# Patient Record
Sex: Male | Born: 1950 | Race: White | Hispanic: No | Marital: Married | Smoking: Never smoker
Health system: Southern US, Community
[De-identification: ages and names within clinical notes are randomized; demographics above are authoritative.]

## PROBLEM LIST (undated history)

## (undated) DIAGNOSIS — K649 Unspecified hemorrhoids: Secondary | ICD-10-CM

## (undated) DIAGNOSIS — B2790 Infectious mononucleosis, unspecified without complication: Secondary | ICD-10-CM

## (undated) DIAGNOSIS — K5730 Diverticulosis of large intestine without perforation or abscess without bleeding: Secondary | ICD-10-CM

## (undated) DIAGNOSIS — I493 Ventricular premature depolarization: Secondary | ICD-10-CM

## (undated) HISTORY — PX: OTHER SURGICAL HISTORY: SHX169

## (undated) HISTORY — DX: Unspecified hemorrhoids: K64.9

## (undated) HISTORY — DX: Infectious mononucleosis, unspecified without complication: B27.90

## (undated) HISTORY — DX: Ventricular premature depolarization: I49.3

## (undated) HISTORY — PX: TONSILLECTOMY AND ADENOIDECTOMY: SUR1326

## (undated) HISTORY — DX: Diverticulosis of large intestine without perforation or abscess without bleeding: K57.30

---

## 1971-08-19 DIAGNOSIS — B2790 Infectious mononucleosis, unspecified without complication: Secondary | ICD-10-CM

## 1971-08-19 HISTORY — DX: Infectious mononucleosis, unspecified without complication: B27.90

## 1990-08-18 HISTORY — PX: KNEE ARTHROSCOPY: SUR90

## 2003-08-19 DIAGNOSIS — K5730 Diverticulosis of large intestine without perforation or abscess without bleeding: Secondary | ICD-10-CM

## 2003-08-19 HISTORY — DX: Diverticulosis of large intestine without perforation or abscess without bleeding: K57.30

## 2003-08-19 HISTORY — PX: OTHER SURGICAL HISTORY: SHX169

## 2009-03-30 ENCOUNTER — Encounter (INDEPENDENT_AMBULATORY_CARE_PROVIDER_SITE_OTHER): Payer: Self-pay | Admitting: *Deleted

## 2011-05-09 ENCOUNTER — Encounter: Payer: Self-pay | Admitting: Internal Medicine

## 2011-05-09 ENCOUNTER — Ambulatory Visit (INDEPENDENT_AMBULATORY_CARE_PROVIDER_SITE_OTHER): Payer: PRIVATE HEALTH INSURANCE | Admitting: Internal Medicine

## 2011-05-09 VITALS — BP 142/90 | HR 60 | Temp 98.5°F | Resp 12 | Ht 72.0 in | Wt 282.4 lb

## 2011-05-09 DIAGNOSIS — Z0000 Encounter for general adult medical examination without abnormal findings: Secondary | ICD-10-CM

## 2011-05-09 DIAGNOSIS — E785 Hyperlipidemia, unspecified: Secondary | ICD-10-CM

## 2011-05-09 LAB — BASIC METABOLIC PANEL
BUN: 19 mg/dL (ref 6–23)
Creatinine, Ser: 1 mg/dL (ref 0.4–1.5)
GFR: 85.98 mL/min (ref >60.00)
Potassium: 4 mEq/L (ref 3.5–5.1)

## 2011-05-09 LAB — HEPATIC FUNCTION PANEL
Albumin: 4.1 g/dL (ref 3.5–5.2)
Total Protein: 7.4 g/dL (ref 6.0–8.3)

## 2011-05-09 LAB — CBC WITH DIFFERENTIAL/PLATELET
Eosinophils Relative: 3.3 % (ref 0.0–5.0)
HCT: 41.1 % (ref 39.0–52.0)
Lymphocytes Relative: 30.5 % (ref 12.0–46.0)
Monocytes Relative: 6.6 % (ref 3.0–12.0)
Neutrophils Relative %: 59.3 % (ref 43.0–77.0)
Platelets: 203 10*3/uL (ref 150.0–400.0)
WBC: 7 10*3/uL (ref 4.5–10.5)

## 2011-05-09 LAB — LIPID PANEL
Cholesterol: 204 mg/dL — ABNORMAL HIGH (ref 0–200)
Total CHOL/HDL Ratio: 5
Triglycerides: 114 mg/dL (ref 0.0–149.0)
VLDL: 22.8 mg/dL (ref 0.0–40.0)

## 2011-05-09 LAB — LDL CHOLESTEROL, DIRECT: Direct LDL: 144.8 mg/dL

## 2011-05-09 LAB — TESTOSTERONE: Testosterone: 338.24 ng/dL — ABNORMAL LOW (ref 350.00–890.00)

## 2011-05-09 NOTE — Patient Instructions (Signed)
Preventive Health Care: Exercise at least 30-45 minutes a day,  3-4 days a week.  Eat a low-fat diet with lots of fruits and vegetables, up to 7-9 servings per day. Consume less than 40 grams of sugar per day from foods & drinks with High Fructose Corn Sugar as # 1,2,3 or # 4 on label. Health Care Power of [place]. Complete if not in place ; these place you in charge of your health care decisions. Blood Pressure Goal  Ideally is an AVERAGE < 135/85. This AVERAGE should be calculated from @ least 5-7 BP readings taken @ different times of day on different days of week. You should not respond to isolated BP readings , but rather the AVERAGE for that week

## 2011-05-09 NOTE — Progress Notes (Signed)
Subjective:    Patient ID: Alex Atkinson, male    DOB: [birth year], 59 y.o.   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      Review of Systems Patient reports no  vision/ hearing changes,anorexia, weight change, fever ,adenopathy, persistant / recurrent hoarseness, swallowing issues, chest pain,palpitations, edema,persistant / recurrent cough, hemoptysis, dyspnea(rest, exertional, paroxysmal nocturnal), gastrointestinal  bleeding (melena, rectal bleeding), abdominal pain, excessive heart burn, GU symptoms( dysuria, hematuria, pyuria, voiding/incontinence  issues) syncope, focal weakness, memory loss,numbness & tingling, skin/hair/nail changes,depression, anxiety, or abnormal bruising/bleeding. He's had pain in his right upper back; this has responded to heat and modified massage.      Objective:   Physical Exam Gen.: Healthy and well-nourished in appearance. Alert, appropriate and cooperative throughout exam. Head: Normocephalic without obvious abnormalities; pattern  alopecia  Eyes: No corneal or conjunctival inflammation noted. Pupils equal round reactive to light and accommodation. Fundal exam is benign without hemorrhages, exudate, papilledema. Extraocular motion intact. Vision grossly normal with lenses. Ears: External  ear exam reveals no significant lesions or deformities. Canals clear .TMs normal. Hearing is grossly normal bilaterally. Nose: External nasal exam reveals no deformity or inflammation. Nasal mucosa are pink and moist. No lesions or exudates noted.   Mouth: Oral mucosa and oropharynx reveal no lesions or exudates. Teeth in good repair. Neck: No deformities, masses, or tenderness noted. Range of motion &. Thyroid normal. Lungs: Normal respiratory effort; chest expands symmetrically. Lungs are clear to auscultation without rales, wheezes, or increased work of breathing. Heart: Normal rate and rhythm. Normal S1 and S2. No  gallop, click, or rub. S4 w/o murmur. Abdomen: Bowel sounds normal; abdomen soft and nontender. No masses, organomegaly or hernias noted. Genitalia/DRE: Minor granuloma are present bilaterally; varicocele is noted on the left. Prostate is normal without enlargement, nodularity, or induration.                                                                                 Musculoskeletal/extremities: No definite scoliosis   But asymmetry of thorax. Lordosis noted of  the thoracic spine. No clubbing, cyanosis, edema, or deformity noted. Range of motion decreased R > L knee; crepitus of knees, R > L  .Tone & strength  normal.Joints normal. Nail health  good. Vascular: Carotid, radial artery, dorsalis pedis and  posterior tibial pulses are full and equal. No bruits present. Neurologic: Alert and oriented x3. Deep tendon reflexes symmetrical and normal.          Skin: Intact without suspicious lesions or rashes. Lymph: No cervical, axillary, or inguinal lymphadenopathy present. Psych: Mood and affect are normal. Normally interactive  Assessment & Plan:  #1 comprehensive physical exam; no acute findings #2 see Problem List with Assessments & Recommendations Plan: see Orders

## 2012-06-28 ENCOUNTER — Telehealth: Payer: Self-pay | Admitting: Internal Medicine

## 2012-06-28 NOTE — Telephone Encounter (Signed)
LMOM to confirm CPE set 12.6.13 at 8am Patient needs a Friday-preferably due to job & need by end of yr

## 2012-06-28 NOTE — Telephone Encounter (Signed)
Message copied by Bradley Ferris on Mon Jun 28, 2012  7:41 AM ------      Message from: Hendricks Limes      Created: Sun Jun 27, 2012 12:17 PM      Regarding: RE: Work in Physical for a patient on a friday please before the end of the yr      Contact: [phone]       Please let me know if you were able to schedule this      ----- Message -----         From: Ailene Rud McDaniels         Sent: 06/25/2012   1:18 PM           To: Hendricks Limes, MD      Subject: Work in Physical for a patient on a friday p#            Pt stated he really needs a physical before the end of the yr this year if at all possible on a Friday. Pt is unsure of what kind of health insurance he will have next year and would really appreciate it if you could find time to work him in on a Friday. I will be glad to offer him any Friday but as of right now he would not be in a physical slot so if there is a time you would prefer to see him please let me know and I will arrange with the patient      Thanks

## 2012-06-28 NOTE — Telephone Encounter (Signed)
Pt confirmed appt., 12.6.13 at 8am

## 2012-07-23 ENCOUNTER — Encounter: Payer: Self-pay | Admitting: Internal Medicine

## 2012-07-23 ENCOUNTER — Ambulatory Visit (INDEPENDENT_AMBULATORY_CARE_PROVIDER_SITE_OTHER): Payer: PRIVATE HEALTH INSURANCE | Admitting: Internal Medicine

## 2012-07-23 VITALS — BP 130/92 | HR 75 | Resp 12 | Ht 72.0 in | Wt 270.8 lb

## 2012-07-23 DIAGNOSIS — R9431 Abnormal electrocardiogram [ECG] [EKG]: Secondary | ICD-10-CM | POA: Insufficient documentation

## 2012-07-23 DIAGNOSIS — I4949 Other premature depolarization: Secondary | ICD-10-CM

## 2012-07-23 DIAGNOSIS — I493 Ventricular premature depolarization: Secondary | ICD-10-CM

## 2012-07-23 DIAGNOSIS — E349 Endocrine disorder, unspecified: Secondary | ICD-10-CM | POA: Insufficient documentation

## 2012-07-23 DIAGNOSIS — Z23 Encounter for immunization: Secondary | ICD-10-CM

## 2012-07-23 DIAGNOSIS — E291 Testicular hypofunction: Secondary | ICD-10-CM

## 2012-07-23 DIAGNOSIS — Z0000 Encounter for general adult medical examination without abnormal findings: Secondary | ICD-10-CM

## 2012-07-23 LAB — LIPID PANEL
LDL Cholesterol: 138 mg/dL — ABNORMAL HIGH (ref 0–99)
Total CHOL/HDL Ratio: 6
Triglycerides: 125 mg/dL (ref 0.0–149.0)

## 2012-07-23 LAB — BASIC METABOLIC PANEL
BUN: 20 mg/dL (ref 6–23)
Creatinine, Ser: 0.9 mg/dL (ref 0.4–1.5)
GFR: 89.99 mL/min (ref >60.00)

## 2012-07-23 LAB — HEPATIC FUNCTION PANEL
ALT: 19 U/L (ref 0–53)
AST: 27 U/L (ref 0–37)
Bilirubin, Direct: 0.3 mg/dL (ref 0.0–0.3)
Total Bilirubin: 1.1 mg/dL (ref 0.3–1.2)

## 2012-07-23 LAB — CBC WITH DIFFERENTIAL/PLATELET
Eosinophils Relative: 3.6 % (ref 0.0–5.0)
Lymphocytes Relative: 28.3 % (ref 12.0–46.0)
Monocytes Relative: 7.9 % (ref 3.0–12.0)
Neutrophils Relative %: 59.8 % (ref 43.0–77.0)
Platelets: 187 10*3/uL (ref 150.0–400.0)
WBC: 7 10*3/uL (ref 4.5–10.5)

## 2012-07-23 NOTE — Progress Notes (Signed)
  Subjective:    Patient ID: Alex Atkinson, male    DOB: 1951/01/13, 61 y.o.   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 Specifically he denies associated chest pain, palpitations, dyspnea, or claudication. He does note some pulse irregularities at rest only.  By history he has caffeine-induced  PVCs. This was evaluated remotely by Dr. Aldona Atkinson, Alex Atkinson.  BP @ home 130-135/85-90     Objective:   Physical Exam Gen.:  well-nourished in appearance. Alert, appropriate and cooperative throughout exam. Head: Normocephalic without obvious abnormalities;  pattern alopecia  Eyes: No corneal or conjunctival inflammation noted. Pupils equal round reactive to light and accommodation. Fundal exam is benign without hemorrhages, exudate, papilledema. Extraocular motion intact. Vision grossly normal. Ears: External  ear exam reveals no significant lesions or deformities. Canals clear .TMs normal. Hearing is grossly normal bilaterally. Nose: External nasal exam reveals no deformity or inflammation. Nasal mucosa are pink and moist. No lesions or exudates noted.  Mouth: Oral mucosa and oropharynx reveal no lesions or exudates. Teeth in good repair. Neck: No deformities, masses, or tenderness noted. Range of motion & Thyroid normal. Lungs: Normal respiratory effort; chest expands symmetrically. Lungs are clear to auscultation without rales, wheezes, or increased work of breathing. Heart: Normal rate and rhythm. Normal S1 and S2. No gallop, click, or rub. No murmur.Rare extra beat Abdomen: Bowel sounds normal; abdomen soft and nontender. No masses, organomegaly or hernias noted. Genitalia/DRE: Genitalia normal except for minor left varices. Prostate is normal without enlargement, asymmetry, nodularity, or  induration.                  Musculoskeletal/extremities: No deformity or scoliosis noted of  the thoracic or lumbar spine. No clubbing, cyanosis, edema, or deformity noted. Range of motion  normal .Tone & strength  normal.Joints normal. Nail health  Good.Crepitus of knees Vascular: Carotid, radial artery, dorsalis pedis and  posterior tibial pulses are full and equal. No bruits present. Neurologic: Alert and oriented x3. Deep tendon reflexes symmetrical and normal.          Skin: Intact without suspicious lesions or rashes. Lymph: No cervical, axillary, or inguinal lymphadenopathy present. Psych: Mood and affect are normal. Normally interactive                                                                                        Assessment & Plan:  #1 comprehensive physical exam; no acute findings #2 RBBB, new  Plan: see Orders

## 2012-07-23 NOTE — Patient Instructions (Addendum)
Preventive Health Care: Eat a low-fat diet with lots of fruits and vegetables, up to 7-9 servings per day.  Consume less than 40 grams of sugar per day from foods & drinks with High Fructose Corn Sugar as #1,2,3 or # 4 on label. Health Care Power of [place]. Complete if not in place ; these place you in charge of your health care decisions. To prevent palpitations or premature beats, avoid stimulants such as decongestants, diet pills, nicotine, or caffeine (coffee, tea, cola, or chocolate) to excess. Blood Pressure Goal= AVERAGE < 140/90,  Ideal is an AVERAGE < 135/85. This AVERAGE should be calculated from @ least 5-7 BP readings taken @ different times of day on different days of week. You should not respond to isolated BP readings , but rather the AVERAGE for that week  Please take enteric-coated aspirin 81 mg daily with breakfast unless NSAIDS taken that day.   If you activate My Chart; the results can be released to you as soon as they populate from the lab. If you choose not to use this program; the labs have to be reviewed, copied & mailed causing a delay in getting the results to you.

## 2012-07-26 ENCOUNTER — Ambulatory Visit: Payer: PRIVATE HEALTH INSURANCE

## 2012-07-26 DIAGNOSIS — R7309 Other abnormal glucose: Secondary | ICD-10-CM

## 2012-07-26 LAB — TESTOSTERONE, FREE, TOTAL, SHBG: Sex Hormone Binding: 48 nmol/L (ref 13–71)

## 2012-07-26 LAB — HEMOGLOBIN A1C: Hgb A1c MFr Bld: 5.5 % (ref 4.6–6.5)

## 2013-06-23 ENCOUNTER — Other Ambulatory Visit: Payer: Self-pay

## 2013-10-16 ENCOUNTER — Emergency Department (INDEPENDENT_AMBULATORY_CARE_PROVIDER_SITE_OTHER): Payer: BC Managed Care – PPO

## 2013-10-16 ENCOUNTER — Emergency Department (INDEPENDENT_AMBULATORY_CARE_PROVIDER_SITE_OTHER)
Admission: EM | Admit: 2013-10-16 | Discharge: 2013-10-16 | Disposition: A | Discharge status: Home / Self Care | Payer: BC Managed Care – PPO | Source: Home / Self Care | Attending: Family Medicine | Admitting: Family Medicine

## 2013-10-16 ENCOUNTER — Encounter (HOSPITAL_COMMUNITY): Payer: Self-pay | Admitting: Emergency Medicine

## 2013-10-16 DIAGNOSIS — J40 Bronchitis, not specified as acute or chronic: Secondary | ICD-10-CM

## 2013-10-16 MED ORDER — ALBUTEROL SULFATE HFA 108 (90 BASE) MCG/ACT IN AERS: 2.0000 | INHALATION_SPRAY | Freq: Four times a day (QID) | RESPIRATORY_TRACT | Status: DC | PRN

## 2013-10-16 MED ORDER — SODIUM CHLORIDE 0.9 % IN NEBU
INHALATION_SOLUTION | RESPIRATORY_TRACT | Status: AC
  Filled 2013-10-16: qty 3

## 2013-10-16 MED ORDER — IPRATROPIUM-ALBUTEROL 0.5-2.5 (3) MG/3ML IN SOLN
RESPIRATORY_TRACT | Status: AC
  Filled 2013-10-16: qty 3

## 2013-10-16 MED ORDER — PREDNISONE 10 MG PO KIT
PACK | ORAL | Status: DC
Start: 2013-10-16 — End: 2014-07-10

## 2013-10-16 MED ORDER — IPRATROPIUM-ALBUTEROL 0.5-2.5 (3) MG/3ML IN SOLN
3.0000 mL | Freq: Once | RESPIRATORY_TRACT | Status: AC
  Administered 2013-10-16: 3 mL via RESPIRATORY_TRACT

## 2013-10-16 MED ORDER — IPRATROPIUM BROMIDE 0.06 % NA SOLN: 2.0000 | Freq: Four times a day (QID) | NASAL | Status: DC

## 2013-10-16 NOTE — Discharge Instructions (Signed)
Thank you for coming in today. Call or go to the emergency room if you get worse, have trouble breathing, have chest pains, or palpitations.   Bronchitis Bronchitis is inflammation of the airways that extend from the windpipe into the lungs (bronchi). The inflammation often causes mucus to develop, which leads to a cough. If the inflammation becomes severe, it may cause shortness of breath. CAUSES  Bronchitis may be caused by:   Viral infections.   Bacteria.   Cigarette smoke.   Allergens, pollutants, and other irritants.  SIGNS AND SYMPTOMS  The most common symptom of bronchitis is a frequent cough that produces mucus. Other symptoms include:  Fever.   Body aches.   Chest congestion.   Chills.   Shortness of breath.   Sore throat.  DIAGNOSIS  Bronchitis is usually diagnosed through a medical history and physical exam. Tests, such as chest X-rays, are sometimes done to rule out other conditions.  TREATMENT  You may need to avoid contact with whatever caused the problem (smoking, for example). Medicines are sometimes needed. These may include:  Antibiotics. These may be prescribed if the condition is caused by bacteria.  Cough suppressants. These may be prescribed for relief of cough symptoms.   Inhaled medicines. These may be prescribed to help open your airways and make it easier for you to breathe.   Steroid medicines. These may be prescribed for those with recurrent (chronic) bronchitis. HOME CARE INSTRUCTIONS  Get plenty of rest.   Drink enough fluids to keep your urine clear or pale yellow (unless you have a medical condition that requires fluid restriction). Increasing fluids may help thin your secretions and will prevent dehydration.   Only take over-the-counter or prescription medicines as directed by your health care provider.  Only take antibiotics as directed. Make sure you finish them even if you start to feel better.  Avoid secondhand  smoke, irritating chemicals, and strong fumes. These will make bronchitis worse. If you are a smoker, quit smoking. Consider using nicotine gum or skin patches to help control withdrawal symptoms. Quitting smoking will help your lungs heal faster.   Put a cool-mist humidifier in your bedroom at night to moisten the air. This may help loosen mucus. Change the water in the humidifier daily. You can also run the hot water in your shower and sit in the bathroom with the door closed for 5 10 minutes.   Follow up with your health care provider as directed.   Wash your hands frequently to avoid catching bronchitis again or spreading an infection to others.  SEEK MEDICAL CARE IF: Your symptoms do not improve after 1 week of treatment.  SEEK IMMEDIATE MEDICAL CARE IF:  Your fever increases.  You have chills.   You have chest pain.   You have worsening shortness of breath.   You have bloody sputum.  You faint.  You have lightheadedness.  You have a severe headache.   You vomit repeatedly. MAKE SURE YOU:   Understand these instructions.  Will watch your condition.  Will get help right away if you are not doing well or get worse. Document Released: 08/04/2005 Document Revised: 05/25/2013 Document Reviewed: 03/29/2013 [facility] Patient Information 2014 [place].

## 2013-10-16 NOTE — ED Provider Notes (Signed)
Alex Atkinson is a 63 y.o. male who presents to Urgent Care today for cough and congestion. Patient has had a cough now for around 10 days. He does have a history of the cough on and off for the past 3 or 4 months. He tried a Z-Pak a few months ago which did not help. He has not had a full evaluation yet. He notes some wheezing and coarse breath sounds as well. He denies any significant shortness of breath fevers or chills. He denies any nausea vomiting or diarrhea. He works as a Animal nutritionist.    Past Medical History  Diagnosis Date  . PVC's (premature ventricular contractions)     caffeine induced; evaluated by Dr Aldona Bar  . Mononucleosis 1973  . Diverticulosis of colon 2005    Dr Fuller Plan   History  Substance Use Topics  . Smoking status: Never Smoker   . Smokeless tobacco: Not on file  . Alcohol Use: Not on file   ROS as above Medications: No current facility-administered medications for this encounter.   Current Outpatient Prescriptions  Medication Sig Dispense Refill  . ibuprofen (ADVIL,MOTRIN) 400 MG tablet Take 400 mg by mouth as needed.        Marland Kitchen albuterol (PROVENTIL HFA;VENTOLIN HFA) 108 (90 BASE) MCG/ACT inhaler Inhale 2 puffs into the lungs every 6 (six) hours as needed for wheezing or shortness of breath.  1 Inhaler  2  . ipratropium (ATROVENT) 0.06 % nasal spray Place 2 sprays into both nostrils 4 (four) times daily.  15 mL  1  . PredniSONE 10 MG KIT 12 day dose pack po  1 kit  0    Exam:  BP 154/101  Pulse 67  Temp(Src) 98.1 F (36.7 C) (Oral)  Resp 16  SpO2 96% Gen: Well NAD HEENT: EOMI,  MMM Lungs: Normal work of breathing. Coarse breath sounds and rhonchi bilaterally Heart: RRR no MRG Abd: NABS, Soft. NT, ND Exts: Brisk capillary refill, warm and well perfused.   Patient was given a DuoNeb nebulizer treatment and felt somewhat better. His lung exam also improved a bit.  No results found for this or any previous visit (from the past 24 hour(s)). Dg Chest  2 View  10/16/2013   CLINICAL DATA:  Cough  EXAM: CHEST  2 VIEW  COMPARISON:  None.  FINDINGS: Cardiomediastinal silhouette is unremarkable. No acute infiltrate or pulmonary edema. Mild basilar atelectasis. No pleural effusion. Mild thoracic dextroscoliosis.  IMPRESSION: No active cardiopulmonary disease.   Electronically Signed   By: Lahoma Crocker M.D.   On: 10/16/2013 12:17    Assessment and Plan: 63 y.o. male with bronchitis. Plan to treat with prednisone, and albuterol. Patient declined codeine based cough medication. Followup with primary care provider as needed.  Discussed warning signs or symptoms. Please see discharge instructions. Patient expresses understanding.    Gregor Hams, MD 10/16/13 1322

## 2013-10-16 NOTE — ED Notes (Signed)
Assessment per Dr. Corey. 

## 2013-10-17 ENCOUNTER — Telehealth: Payer: Self-pay | Admitting: Internal Medicine

## 2013-10-17 NOTE — Telephone Encounter (Signed)
Called pt regarding scheduling request from Dr. Linna Darner below:  Please schedule followup after UC visit later in week of 3/2 for [name] if symptoms persist. 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

## 2013-10-18 NOTE — Telephone Encounter (Signed)
Appt scheduled 3/11 @ 1pm

## 2013-10-26 ENCOUNTER — Encounter: Payer: Self-pay | Admitting: Internal Medicine

## 2013-10-26 ENCOUNTER — Ambulatory Visit (INDEPENDENT_AMBULATORY_CARE_PROVIDER_SITE_OTHER): Payer: BC Managed Care – PPO | Admitting: Internal Medicine

## 2013-10-26 VITALS — BP 130/90 | HR 70 | Temp 99.2°F | Resp 14 | Wt 280.1 lb

## 2013-10-26 DIAGNOSIS — R059 Cough, unspecified: Secondary | ICD-10-CM

## 2013-10-26 DIAGNOSIS — J683 Other acute and subacute respiratory conditions due to chemicals, gases, fumes and vapors: Secondary | ICD-10-CM

## 2013-10-26 DIAGNOSIS — J45909 Unspecified asthma, uncomplicated: Secondary | ICD-10-CM

## 2013-10-26 DIAGNOSIS — R05 Cough: Secondary | ICD-10-CM

## 2013-10-26 MED ORDER — BUDESONIDE-FORMOTEROL FUMARATE 160-4.5 MCG/ACT IN AERO: 2.0000 | INHALATION_SPRAY | Freq: Two times a day (BID) | RESPIRATORY_TRACT | Status: DC

## 2013-10-26 NOTE — Progress Notes (Signed)
Pre visit review using our clinic review tool, if applicable. No additional management support is needed unless otherwise documented below in the visit note. 

## 2013-10-26 NOTE — Patient Instructions (Signed)
Stretching and warming up prior to exercise will allow the airways to acclimate. Continue to use albuterol one spray 30 minutes prior and one puff 15 minutes prior to exercise. Take the Singulair 10 mg the evening prior to the exercise program. Symbicort one - two inhalations every 12 hours; gargle and spit after use

## 2013-10-26 NOTE — Progress Notes (Signed)
   Subjective:    Patient ID: Alex Atkinson, male    DOB: 06-Nov-1950, 63 y.o.   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 He's done this for 20 years. He was seen at the urgent care 10/16/13 and diagnosed with bronchitis he was placed on a prednisone taper as well as albuterol rescue inhaler.  He has improved but continues to use albuterol 2-3 times per day. He also uses it prior to exercise  The cough produces minimal sputum; occasionally he will produce plugs particularly in the morning.  He does notice wheezing occasionally with deep breaths.  Chest x-ray 3/1 was reviewed. It was interpreted as showing no active cardio pulmonary disease. There is suggestion of minor remote granulomatous change and increased markings in the bases. There was no acute infiltrate.  He has no history of asthma has never smoked. He did grow up in the [place] area.    Review of Systems  He denies fever, chills, or sweats.  He has no purulent sputum and denies hemoptysis.  He denies itchy, watery eyes, sneezing. He has no postnasal drainage. He also denies purulent nasal break drainage, frontal headache, or facial pain.     Objective:   Physical Exam General appearance:good health ;well nourished; no acute distress or increased work of breathing is present.  No  lymphadenopathy about the head, neck, or axilla noted.   Eyes: No conjunctival inflammation or lid edema is present.   Ears:  External ear exam shows no significant lesions or deformities.  Otoscopic examination reveals clear canals, tympanic membranes are intact bilaterally without bulging, retraction, inflammation or discharge.  Nose:  External nasal examination shows no deformity or inflammation. Nasal mucosa are pink and moist without lesions or exudates. No septal dislocation or deviation.No obstruction to airflow.   Oral exam: Dental hygiene is good; lips and gums are  healthy appearing.There is no oropharyngeal erythema or exudate noted.   Neck:  No deformities, thyromegaly, masses, or tenderness noted.      Heart:  Normal rate and regular rhythm. S1 and S2 normal without gallop, murmur, click, rub or other extra sounds.   Lungs:Chest clear to auscultation; no wheezes, rhonchi,rales ,or rubs present.No increased work of breathing.    Extremities:  No cyanosis, edema, or clubbing  noted    Skin: Warm & dry         Assessment & Plan:  #1 reactive airways disease; the inciting event may have been a viral syndrome. He now has a component of exercise-induced bronchospasm as well as intermittent nonproductive cough.  Plan: Pathophysiology discussed. Dual agent, Symbicort provided as sample and prescription. If symptoms persist pulmonary function test should be completed. He should avoid the smoke from  wood burning stove.

## 2014-03-03 ENCOUNTER — Encounter: Payer: Self-pay | Admitting: Gastroenterology

## 2014-06-08 ENCOUNTER — Encounter: Payer: Self-pay | Admitting: Gastroenterology

## 2014-07-10 ENCOUNTER — Encounter (HOSPITAL_COMMUNITY): Payer: Self-pay | Admitting: *Deleted

## 2014-07-10 ENCOUNTER — Emergency Department (HOSPITAL_COMMUNITY): Payer: BC Managed Care – PPO

## 2014-07-10 ENCOUNTER — Inpatient Hospital Stay (HOSPITAL_COMMUNITY)
Admission: EM | Admit: 2014-07-10 | Discharge: 2014-07-14 | DRG: 419 | Disposition: A | Discharge status: Home / Self Care | Payer: BC Managed Care – PPO | Attending: General Surgery | Admitting: General Surgery

## 2014-07-10 ENCOUNTER — Emergency Department (HOSPITAL_COMMUNITY)
Admission: EM | Admit: 2014-07-10 | Discharge: 2014-07-10 | Disposition: A | Discharge status: Nursing Home (Includes ICF) | Payer: BC Managed Care – PPO | Source: Home / Self Care

## 2014-07-10 ENCOUNTER — Encounter (HOSPITAL_COMMUNITY): Payer: Self-pay | Admitting: Emergency Medicine

## 2014-07-10 DIAGNOSIS — R109 Unspecified abdominal pain: Secondary | ICD-10-CM

## 2014-07-10 DIAGNOSIS — R1011 Right upper quadrant pain: Secondary | ICD-10-CM

## 2014-07-10 DIAGNOSIS — Z6836 Body mass index (BMI) 36.0-36.9, adult: Secondary | ICD-10-CM

## 2014-07-10 DIAGNOSIS — K830 Cholangitis: Secondary | ICD-10-CM | POA: Diagnosis not present

## 2014-07-10 DIAGNOSIS — K8063 Calculus of gallbladder and bile duct with acute cholecystitis with obstruction: Secondary | ICD-10-CM | POA: Diagnosis present

## 2014-07-10 DIAGNOSIS — R17 Unspecified jaundice: Secondary | ICD-10-CM

## 2014-07-10 DIAGNOSIS — Z823 Family history of stroke: Secondary | ICD-10-CM

## 2014-07-10 DIAGNOSIS — K819 Cholecystitis, unspecified: Secondary | ICD-10-CM | POA: Diagnosis present

## 2014-07-10 DIAGNOSIS — K8309 Other cholangitis: Secondary | ICD-10-CM | POA: Diagnosis present

## 2014-07-10 DIAGNOSIS — K810 Acute cholecystitis: Secondary | ICD-10-CM | POA: Diagnosis present

## 2014-07-10 DIAGNOSIS — Z806 Family history of leukemia: Secondary | ICD-10-CM | POA: Diagnosis not present

## 2014-07-10 DIAGNOSIS — K8067 Calculus of gallbladder and bile duct with acute and chronic cholecystitis with obstruction: Secondary | ICD-10-CM | POA: Diagnosis present

## 2014-07-10 DIAGNOSIS — R945 Abnormal results of liver function studies: Secondary | ICD-10-CM | POA: Diagnosis present

## 2014-07-10 DIAGNOSIS — E785 Hyperlipidemia, unspecified: Secondary | ICD-10-CM | POA: Diagnosis present

## 2014-07-10 DIAGNOSIS — R7989 Other specified abnormal findings of blood chemistry: Secondary | ICD-10-CM | POA: Diagnosis present

## 2014-07-10 DIAGNOSIS — K219 Gastro-esophageal reflux disease without esophagitis: Secondary | ICD-10-CM | POA: Diagnosis present

## 2014-07-10 DIAGNOSIS — Z9049 Acquired absence of other specified parts of digestive tract: Secondary | ICD-10-CM

## 2014-07-10 LAB — CBC WITH DIFFERENTIAL/PLATELET
Basophils Absolute: 0 10*3/uL (ref 0.0–0.1)
Basophils Relative: 0 % (ref 0–1)
Eosinophils Absolute: 0 10*3/uL (ref 0.0–0.7)
Eosinophils Relative: 0 % (ref 0–5)
HEMATOCRIT: 42.1 % (ref 39.0–52.0)
HEMOGLOBIN: 14.1 g/dL (ref 13.0–17.0)
LYMPHS ABS: 1 10*3/uL (ref 0.7–4.0)
LYMPHS PCT: 7 % — AB (ref 12–46)
MCH: 29.9 pg (ref 26.0–34.0)
MCHC: 33.5 g/dL (ref 30.0–36.0)
MCV: 89.4 fL (ref 78.0–100.0)
MONO ABS: 0.8 10*3/uL (ref 0.1–1.0)
MONOS PCT: 5 % (ref 3–12)
NEUTROS ABS: 13 10*3/uL — AB (ref 1.7–7.7)
Neutrophils Relative %: 88 % — ABNORMAL HIGH (ref 43–77)
Platelets: 253 10*3/uL (ref 150–400)
RBC: 4.71 MIL/uL (ref 4.22–5.81)
RDW: 12.3 % (ref 11.5–15.5)
WBC: 14.8 10*3/uL — ABNORMAL HIGH (ref 4.0–10.5)

## 2014-07-10 LAB — COMPREHENSIVE METABOLIC PANEL
ALBUMIN: 4.2 g/dL (ref 3.5–5.2)
ALT: 24 U/L (ref 0–53)
AST: 25 U/L (ref 0–37)
Alkaline Phosphatase: 56 U/L (ref 39–117)
Anion gap: 13 (ref 5–15)
BUN: 23 mg/dL (ref 6–23)
CALCIUM: 9.4 mg/dL (ref 8.4–10.5)
CO2: 25 mEq/L (ref 19–32)
Chloride: 99 mEq/L (ref 96–112)
Creatinine, Ser: 1.03 mg/dL (ref 0.50–1.35)
GFR calc non Af Amer: 75 mL/min — ABNORMAL LOW (ref >90)
GFR, EST AFRICAN AMERICAN: 87 mL/min — AB (ref >90)
Glucose, Bld: 121 mg/dL — ABNORMAL HIGH (ref 70–99)
POTASSIUM: 4.4 meq/L (ref 3.7–5.3)
SODIUM: 137 meq/L (ref 137–147)
TOTAL PROTEIN: 8 g/dL (ref 6.0–8.3)
Total Bilirubin: 0.7 mg/dL (ref 0.3–1.2)

## 2014-07-10 LAB — URINALYSIS, ROUTINE W REFLEX MICROSCOPIC
BILIRUBIN URINE: NEGATIVE
Glucose, UA: NEGATIVE mg/dL
Ketones, ur: 40 mg/dL — AB
Leukocytes, UA: NEGATIVE
Nitrite: NEGATIVE
Protein, ur: NEGATIVE mg/dL
Specific Gravity, Urine: 1.025 (ref 1.005–1.030)
UROBILINOGEN UA: 1 mg/dL (ref 0.0–1.0)
pH: 5.5 (ref 5.0–8.0)

## 2014-07-10 LAB — URINE MICROSCOPIC-ADD ON

## 2014-07-10 LAB — I-STAT TROPONIN, ED: TROPONIN I, POC: 0 ng/mL (ref 0.00–0.08)

## 2014-07-10 LAB — LIPASE, BLOOD: Lipase: 18 U/L (ref 11–59)

## 2014-07-10 MED ORDER — FENTANYL CITRATE 0.05 MG/ML IJ SOLN
50.0000 ug | Freq: Once | INTRAMUSCULAR | Status: AC
  Administered 2014-07-10: 50 ug via INTRAVENOUS
  Filled 2014-07-10: qty 2

## 2014-07-10 MED ORDER — ONDANSETRON HCL 4 MG/2ML IJ SOLN
4.0000 mg | Freq: Once | INTRAMUSCULAR | Status: AC
  Administered 2014-07-10: 4 mg via INTRAVENOUS
  Filled 2014-07-10: qty 2

## 2014-07-10 MED ORDER — CEFTRIAXONE SODIUM 1 G IJ SOLR
1.0000 g | Freq: Once | INTRAMUSCULAR | Status: AC
  Administered 2014-07-10: 1 g via INTRAVENOUS
  Filled 2014-07-10: qty 10

## 2014-07-10 MED ORDER — GI COCKTAIL ~~LOC~~
30.0000 mL | Freq: Once | ORAL | Status: AC
  Administered 2014-07-10: 30 mL via ORAL

## 2014-07-10 MED ORDER — LORAZEPAM 2 MG/ML IJ SOLN
0.5000 mg | Freq: Once | INTRAMUSCULAR | Status: AC
  Administered 2014-07-10: 0.5 mg via INTRAVENOUS
  Filled 2014-07-10: qty 1

## 2014-07-10 MED ORDER — HYDROMORPHONE HCL 1 MG/ML IJ SOLN
1.0000 mg | Freq: Once | INTRAMUSCULAR | Status: AC
  Administered 2014-07-10: 1 mg via INTRAVENOUS
  Filled 2014-07-10: qty 1

## 2014-07-10 MED ORDER — SODIUM CHLORIDE 0.9 % IV SOLN
Freq: Once | INTRAVENOUS | Status: AC
  Administered 2014-07-10: 22:00:00 via INTRAVENOUS

## 2014-07-10 MED ORDER — METRONIDAZOLE IN NACL 5-0.79 MG/ML-% IV SOLN
500.0000 mg | Freq: Once | INTRAVENOUS | Status: DC
  Filled 2014-07-10: qty 100

## 2014-07-10 MED ORDER — GI COCKTAIL ~~LOC~~
ORAL | Status: AC
  Filled 2014-07-10: qty 30

## 2014-07-10 NOTE — ED Notes (Signed)
Pt reports acute onset of epigastric and RUQ pain that began today approx 0100 - pt from [facility] d/t x3 episodes of pain in the past 2 weeks. Pt denies diarrhea or fever - states he feel the urge to vomit however is unable to.

## 2014-07-10 NOTE — ED Notes (Signed)
Pt monitored by pulse ox, bp cuff, and 12-lead. 

## 2014-07-10 NOTE — ED Notes (Signed)
Reports abdominal pain, nausea, episodes of diaphoresis.  Reports specific dates of pain: 11/10, 11/20, 11/23.  Patient reports continuous dull pain, sharpens intermittently, nusea, no vomiting, no diarrhea, last bm was this am, normal per patient.  Points to center/epigastric area as location of pain

## 2014-07-10 NOTE — ED Provider Notes (Signed)
CSN: [id number]     Arrival date & time 07/10/14  1808 History   None    Chief Complaint  Patient presents with  . Abdominal Pain   (Consider location/radiation/quality/duration/timing/severity/associated sxs/prior Treatment) Patient is a 63 y.o. male presenting with abdominal pain. The history is provided by the patient and the spouse.  Abdominal Pain Pain location:  Epigastric and RUQ Pain quality: burning and gnawing   Pain radiates to:  Does not radiate Pain severity:  Moderate Onset quality:  Gradual Duration:  2 weeks Progression:  Waxing and waning Chronicity:  Recurrent Relieved by:  Nothing Worsened by:  Eating Associated symptoms: nausea   Associated symptoms: no anorexia, no chest pain, no constipation, no cough, no diarrhea, no fever, no hematochezia, no melena, no shortness of breath and no vomiting   Associated symptoms comment:  Diaphoresis.   Past Medical History  Diagnosis Date  . PVC's (premature ventricular contractions)     caffeine induced; evaluated by Dr Aldona Bar  . Mononucleosis 1973  . Diverticulosis of colon 2005    Dr Fuller Plan   Past Surgical History  Procedure Laterality Date  . Atrthroscopy knee  1983 & 2007    R  . Knee arthroscopy  1992    L  . Colonoscopy with polypectomy  2005    polypoid colonic mucosa; Dr Fuller Plan  . Tonsillectomy and adenoidectomy     Family History  Problem Relation Age of Onset  . Lung cancer Father     cns metastases  . Stroke Maternal Grandmother 87  . Dementia Mother   . Leukemia Daughter     CML  . Diabetes Neg Hx   . Heart disease Neg Hx    History  Substance Use Topics  . Smoking status: Never Smoker   . Smokeless tobacco: Not on file  . Alcohol Use: 0.0 oz/week    Review of Systems  Constitutional: Negative.  Negative for fever.  HENT: Negative.   Respiratory: Negative for cough, shortness of breath and wheezing.   Cardiovascular: Negative.  Negative for chest pain.  Gastrointestinal: Positive  for nausea and abdominal pain. Negative for vomiting, diarrhea, constipation, blood in stool, melena, hematochezia, abdominal distention and anorexia.  Genitourinary: Negative.   Skin: Negative.     Allergies  [place]  Home Medications   Prior to Admission medications   Medication Sig Start Date End Date Taking? Authorizing Provider  albuterol (PROVENTIL HFA;VENTOLIN HFA) 108 (90 BASE) MCG/ACT inhaler Inhale 2 puffs into the lungs every 6 (six) hours as needed for wheezing or shortness of breath. 10/16/13   Gregor Hams, MD  budesonide-formoterol [facility]) 160-4.5 MCG/ACT inhaler Inhale 2 puffs into the lungs 2 (two) times daily. 10/26/13   Hendricks Limes, MD  ibuprofen (ADVIL,MOTRIN) 400 MG tablet Take 400 mg by mouth as needed.      Historical Provider, MD  ipratropium (ATROVENT) 0.06 % nasal spray Place 2 sprays into both nostrils 4 (four) times daily. 10/16/13   Gregor Hams, MD  PredniSONE 10 MG KIT 12 day dose pack po 10/16/13   Gregor Hams, MD   BP 178/99 mmHg  Pulse 84  Temp(Src) 97.8 F (36.6 C) (Oral)  Resp 20  SpO2 96% Physical Exam  Constitutional: He is oriented to person, place, and time. He appears well-developed and well-nourished. No distress.  Neck: Normal range of motion. Neck supple.  Cardiovascular: Normal rate, regular rhythm, normal heart sounds and intact distal pulses.   Pulmonary/Chest: Effort normal and breath  sounds normal.  Abdominal: Soft. Normal appearance and bowel sounds are normal. He exhibits no distension and no mass. There is no hepatosplenomegaly or splenomegaly. There is tenderness in the right upper quadrant and epigastric area. There is no rebound, no guarding and no CVA tenderness. No hernia.  Lymphadenopathy:    He has no cervical adenopathy.  Neurological: He is alert and oriented to person, place, and time.  Skin: Skin is warm and dry.  Nursing note and vitals reviewed.   ED Course  Procedures (including critical care time) Labs  Review Labs Reviewed - No data to display  Imaging Review No results found.   MDM   1. Abdominal pain in male    No relief of sx from gi cocktail, sent for eval of sx---GB, gastritis, gerd, pacreatitis, no h/o etoh.., pos AAA-doubt.Billy Fischer, MD 07/10/14 [phone]

## 2014-07-10 NOTE — ED Provider Notes (Signed)
CSN: [id number]     Arrival date & time 07/10/14  1954 History   First MD Initiated Contact with Patient 07/10/14 2123     Chief Complaint  Patient presents with  . Abdominal Pain     HPI  Patient presents with epigastric, right upper quadrant pain. Patient has had several episodes in the past few weeks.  Today's episode is the most severe, persistent.  Onset was approximately 12 hours ago.  Since onset pain has been persistent, severe, sharp or There is persistent anorexia, nausea, but no vomiting, diarrhea. No fevers, chills.  No improvement with Zofran.  No other chest pain, dyspnea, lightheadedness, syncope. Patient is generally well.  Past Medical History  Diagnosis Date  . PVC's (premature ventricular contractions)     caffeine induced; evaluated by Dr Aldona Bar  . Mononucleosis 1973  . Diverticulosis of colon 2005    Dr Fuller Plan   Past Surgical History  Procedure Laterality Date  . Atrthroscopy knee  1983 & 2007    R  . Knee arthroscopy  1992    L  . Colonoscopy with polypectomy  2005    polypoid colonic mucosa; Dr Fuller Plan  . Tonsillectomy and adenoidectomy     Family History  Problem Relation Age of Onset  . Lung cancer Father     cns metastases  . Stroke Maternal Grandmother 87  . Dementia Mother   . Leukemia Daughter     CML  . Diabetes Neg Hx   . Heart disease Neg Hx    History  Substance Use Topics  . Smoking status: Never Smoker   . Smokeless tobacco: Not on file  . Alcohol Use: 0.0 oz/week    Review of Systems  Constitutional:       Per HPI, otherwise negative  HENT:       Per HPI, otherwise negative  Respiratory:       Per HPI, otherwise negative  Cardiovascular:       Per HPI, otherwise negative  Gastrointestinal: Positive for nausea and abdominal pain. Negative for vomiting.  Endocrine:       Negative aside from HPI  Genitourinary:       Neg aside from HPI   Musculoskeletal:       Per HPI, otherwise negative  Skin: Negative.    Neurological: Negative for syncope.      Allergies  Darvon  Home Medications   Prior to Admission medications   Medication Sig Start Date End Date Taking? Authorizing Provider  calcium carbonate (TUMS - DOSED IN MG ELEMENTAL CALCIUM) 500 MG chewable tablet Chew 1 tablet by mouth daily.   Yes Historical Provider, MD  famotidine (PEPCID) 20 MG tablet Take 20 mg by mouth as needed for heartburn or indigestion.   Yes Historical Provider, MD  ibuprofen (ADVIL,MOTRIN) 400 MG tablet Take 400 mg by mouth as needed.     Yes Historical Provider, MD  albuterol (PROVENTIL HFA;VENTOLIN HFA) 108 (90 BASE) MCG/ACT inhaler Inhale 2 puffs into the lungs every 6 (six) hours as needed for wheezing or shortness of breath. 10/16/13   Gregor Hams, MD   BP 135/62 mmHg  Pulse 78  Temp(Src) 98.1 F (36.7 C) (Oral)  Resp 26  Ht 6' (1.829 m)  Wt 268 lb (121.564 kg)  BMI 36.34 kg/m2  SpO2 97% Physical Exam  Constitutional: He is oriented to person, place, and time. He appears well-developed. He appears distressed.  Uncomfortable appearing male  HENT:  Head: Normocephalic and atraumatic.  Eyes: Conjunctivae and EOM are normal.  Cardiovascular: Normal rate and regular rhythm.   Pulmonary/Chest: Effort normal. No stridor. No respiratory distress.  Abdominal: He exhibits no distension. There is tenderness in the right upper quadrant and epigastric area.  Musculoskeletal: He exhibits no edema.  Neurological: He is alert and oriented to person, place, and time.  Skin: Skin is warm. He is diaphoretic.  Psychiatric: He has a normal mood and affect.  Nursing note and vitals reviewed.   ED Course  Procedures (including critical care time) Labs Review Labs Reviewed  CBC WITH DIFFERENTIAL - Abnormal; Notable for the following:    WBC 14.8 (*)    Neutrophils Relative % 88 (*)    Neutro Abs 13.0 (*)    Lymphocytes Relative 7 (*)    All other components within normal limits  COMPREHENSIVE METABOLIC PANEL -  Abnormal; Notable for the following:    Glucose, Bld 121 (*)    GFR calc non Af Amer 75 (*)    GFR calc Af Amer 87 (*)    All other components within normal limits  URINALYSIS, ROUTINE W REFLEX MICROSCOPIC - Abnormal; Notable for the following:    Hgb urine dipstick SMALL (*)    Ketones, ur 40 (*)    All other components within normal limits  URINE MICROSCOPIC-ADD ON - Abnormal; Notable for the following:    Squamous Epithelial / LPF FEW (*)    Bacteria, UA FEW (*)    All other components within normal limits  LIPASE, BLOOD  I-STAT TROPOININ, ED    Imaging Review US Abdomen Limited  07/10/2014   CLINICAL DATA:  Right upper quadrant abdominal pain  EXAM: US ABDOMEN LIMITED - RIGHT UPPER QUADRANT  COMPARISON:  None.  FINDINGS: Gallbladder:  Sludge is layering within the gallbladder; no stone identified. The gallbladder is distended and there is mild wall thickening (5 mm) with striated appearance. There is no focal gallbladder tenderness, although the patient is reportedly medicated.  Common bile duct:  Diameter: 5 mm  Liver:  No focal lesion identified. Within normal limits in parenchymal echogenicity.  IMPRESSION: 1. Gallbladder wall thickening and mild edema, cannot exclude acute cholecystitis from occult stone. 2. Gallbladder sludge.   Electronically Signed   By: Jorje Guild M.D.   On: 07/10/2014 22:37     EKG Interpretation   Date/Time:  Monday July 10 2014 20:28:59 EST Ventricular Rate:  86 PR Interval:  164 QRS Duration: 154 QT Interval:  408 QTC Calculation: 488 R Axis:   3 Text Interpretation:  Normal sinus rhythm Right bundle branch block  Abnormal ECG Sinus rhythm Right bundle branch block Abnormal ekg Confirmed  by Carmin Muskrat  MD ([id number]) on 07/10/2014 9:27:46 PM      MDM   Final diagnoses:  Abdominal pain  RUQ abdominal pain  Acute cholecystitis    Patient presents with episodic upper abdominal, right upper quadrant pain, worse today.  On exam  patient is diaphoretic, very uncomfortable appearing.  Patient has mild leukocytosis, and ultrasound consistent with acute cholecystitis. Patient's vital signs are stable however, the patient required multiple boluses of analgesics, antiemetics, fluids for symptomatically control. I discussed patient's case with our surgical colleagues, and the patient was admitted for further evaluation and management.    Carmin Muskrat, MD 07/11/14 Dyann Kief

## 2014-07-10 NOTE — ED Notes (Signed)
Dr.Lockwood at bedside  

## 2014-07-11 ENCOUNTER — Observation Stay (HOSPITAL_COMMUNITY): Payer: BC Managed Care – PPO | Admitting: Certified Registered Nurse Anesthetist

## 2014-07-11 ENCOUNTER — Encounter (HOSPITAL_COMMUNITY): Admission: EM | Disposition: A | Discharge status: Home / Self Care | Payer: Self-pay | Source: Home / Self Care

## 2014-07-11 DIAGNOSIS — K810 Acute cholecystitis: Secondary | ICD-10-CM | POA: Diagnosis present

## 2014-07-11 HISTORY — PX: CHOLECYSTECTOMY: SHX55

## 2014-07-11 LAB — COMPREHENSIVE METABOLIC PANEL
ALT: 19 U/L (ref 0–53)
AST: 21 U/L (ref 0–37)
Albumin: 3.6 g/dL (ref 3.5–5.2)
Alkaline Phosphatase: 52 U/L (ref 39–117)
Anion gap: 15 (ref 5–15)
BILIRUBIN TOTAL: 0.8 mg/dL (ref 0.3–1.2)
BUN: 17 mg/dL (ref 6–23)
CHLORIDE: 97 meq/L (ref 96–112)
CO2: 22 meq/L (ref 19–32)
Calcium: 8.6 mg/dL (ref 8.4–10.5)
Creatinine, Ser: 0.81 mg/dL (ref 0.50–1.35)
GFR calc Af Amer: >90 mL/min (ref >90)
Glucose, Bld: 132 mg/dL — ABNORMAL HIGH (ref 70–99)
Potassium: 3.9 mEq/L (ref 3.7–5.3)
SODIUM: 134 meq/L — AB (ref 137–147)
Total Protein: 7.2 g/dL (ref 6.0–8.3)

## 2014-07-11 LAB — CBC
HCT: 39.7 % (ref 39.0–52.0)
Hemoglobin: 13.3 g/dL (ref 13.0–17.0)
MCH: 29.9 pg (ref 26.0–34.0)
MCHC: 33.5 g/dL (ref 30.0–36.0)
MCV: 89.2 fL (ref 78.0–100.0)
PLATELETS: 211 10*3/uL (ref 150–400)
RBC: 4.45 MIL/uL (ref 4.22–5.81)
RDW: 12.4 % (ref 11.5–15.5)
WBC: 12.8 10*3/uL — ABNORMAL HIGH (ref 4.0–10.5)

## 2014-07-11 LAB — SURGICAL PCR SCREEN
MRSA, PCR: NEGATIVE
Staphylococcus aureus: NEGATIVE

## 2014-07-11 SURGERY — LAPAROSCOPIC CHOLECYSTECTOMY WITH INTRAOPERATIVE CHOLANGIOGRAM
Anesthesia: General | Site: Abdomen

## 2014-07-11 MED ORDER — GLYCOPYRROLATE 0.2 MG/ML IJ SOLN
INTRAMUSCULAR | Status: AC
  Filled 2014-07-11: qty 2

## 2014-07-11 MED ORDER — LIDOCAINE HCL (CARDIAC) 20 MG/ML IV SOLN
INTRAVENOUS | Status: AC
  Filled 2014-07-11: qty 20

## 2014-07-11 MED ORDER — ONDANSETRON HCL 4 MG/2ML IJ SOLN: 4.0000 mg | Freq: Once | INTRAMUSCULAR | Status: DC | PRN

## 2014-07-11 MED ORDER — FENTANYL CITRATE 0.05 MG/ML IJ SOLN
INTRAMUSCULAR | Status: AC
  Filled 2014-07-11: qty 2

## 2014-07-11 MED ORDER — HEMOSTATIC AGENTS (NO CHARGE) OPTIME
TOPICAL | Status: DC | PRN
  Administered 2014-07-11: 1 via TOPICAL

## 2014-07-11 MED ORDER — HYDROMORPHONE HCL 1 MG/ML IJ SOLN
1.0000 mg | INTRAMUSCULAR | Status: DC | PRN
  Administered 2014-07-11 – 2014-07-12 (×4): 1 mg via INTRAVENOUS
  Filled 2014-07-11 (×4): qty 1

## 2014-07-11 MED ORDER — PROPOFOL 10 MG/ML IV BOLUS
INTRAVENOUS | Status: AC
  Filled 2014-07-11: qty 20

## 2014-07-11 MED ORDER — ENOXAPARIN SODIUM 40 MG/0.4ML ~~LOC~~ SOLN
40.0000 mg | SUBCUTANEOUS | Status: DC
  Administered 2014-07-11 – 2014-07-13 (×3): 40 mg via SUBCUTANEOUS
  Filled 2014-07-11 (×6): qty 0.4

## 2014-07-11 MED ORDER — GLYCOPYRROLATE 0.2 MG/ML IJ SOLN
INTRAMUSCULAR | Status: DC | PRN
  Administered 2014-07-11: 0.4 mg via INTRAVENOUS

## 2014-07-11 MED ORDER — SUCCINYLCHOLINE CHLORIDE 20 MG/ML IJ SOLN
INTRAMUSCULAR | Status: DC | PRN
  Administered 2014-07-11: 140 mg via INTRAVENOUS

## 2014-07-11 MED ORDER — ONDANSETRON HCL 4 MG/2ML IJ SOLN: 4.0000 mg | Freq: Three times a day (TID) | INTRAMUSCULAR | Status: DC | PRN

## 2014-07-11 MED ORDER — ALBUTEROL SULFATE (2.5 MG/3ML) 0.083% IN NEBU: 3.0000 mL | INHALATION_SOLUTION | Freq: Four times a day (QID) | RESPIRATORY_TRACT | Status: DC | PRN

## 2014-07-11 MED ORDER — ROCURONIUM BROMIDE 50 MG/5ML IV SOLN
INTRAVENOUS | Status: AC
  Filled 2014-07-11: qty 1

## 2014-07-11 MED ORDER — FENTANYL CITRATE 0.05 MG/ML IJ SOLN
INTRAMUSCULAR | Status: DC | PRN
  Administered 2014-07-11: 50 ug via INTRAVENOUS
  Administered 2014-07-11: 150 ug via INTRAVENOUS
  Administered 2014-07-11: 50 ug via INTRAVENOUS

## 2014-07-11 MED ORDER — FENTANYL CITRATE 0.05 MG/ML IJ SOLN
25.0000 ug | Freq: Once | INTRAMUSCULAR | Status: AC
  Administered 2014-07-11: 25 ug via INTRAVENOUS

## 2014-07-11 MED ORDER — NEOSTIGMINE METHYLSULFATE 10 MG/10ML IV SOLN
INTRAVENOUS | Status: DC | PRN
Start: 2014-07-11 — End: 2014-07-11
  Administered 2014-07-11: 3 mg via INTRAVENOUS

## 2014-07-11 MED ORDER — MIDAZOLAM HCL 2 MG/2ML IJ SOLN
INTRAMUSCULAR | Status: AC
  Filled 2014-07-11: qty 2

## 2014-07-11 MED ORDER — 0.9 % SODIUM CHLORIDE (POUR BTL) OPTIME
TOPICAL | Status: DC | PRN
  Administered 2014-07-11: 1000 mL

## 2014-07-11 MED ORDER — HYDROMORPHONE HCL 1 MG/ML IJ SOLN: 0.2500 mg | INTRAMUSCULAR | Status: DC | PRN

## 2014-07-11 MED ORDER — VECURONIUM BROMIDE 10 MG IV SOLR
INTRAVENOUS | Status: AC
  Filled 2014-07-11: qty 20

## 2014-07-11 MED ORDER — OXYCODONE HCL 5 MG PO TABS: 5.0000 mg | ORAL_TABLET | Freq: Once | ORAL | Status: DC | PRN

## 2014-07-11 MED ORDER — OXYCODONE HCL 5 MG/5ML PO SOLN: 5.0000 mg | Freq: Once | ORAL | Status: DC | PRN

## 2014-07-11 MED ORDER — HYDROMORPHONE HCL 1 MG/ML IJ SOLN
INTRAMUSCULAR | Status: AC
  Filled 2014-07-11: qty 1

## 2014-07-11 MED ORDER — CEFTRIAXONE SODIUM 1 G IJ SOLR
1.0000 g | INTRAMUSCULAR | Status: DC
  Administered 2014-07-11 – 2014-07-13 (×3): 1 g via INTRAVENOUS
  Filled 2014-07-11 (×4): qty 10

## 2014-07-11 MED ORDER — BUPIVACAINE HCL 0.25 % IJ SOLN
INTRAMUSCULAR | Status: DC | PRN
  Administered 2014-07-11: 5.5 mL

## 2014-07-11 MED ORDER — FENTANYL CITRATE 0.05 MG/ML IJ SOLN
INTRAMUSCULAR | Status: AC
  Filled 2014-07-11: qty 5

## 2014-07-11 MED ORDER — PROPOFOL 10 MG/ML IV BOLUS
INTRAVENOUS | Status: DC | PRN
Start: 2014-07-11 — End: 2014-07-11
  Administered 2014-07-11: 200 mg via INTRAVENOUS

## 2014-07-11 MED ORDER — STERILE WATER FOR INJECTION IJ SOLN
INTRAMUSCULAR | Status: AC
  Filled 2014-07-11: qty 30

## 2014-07-11 MED ORDER — PHENYLEPHRINE 40 MCG/ML (10ML) SYRINGE FOR IV PUSH (FOR BLOOD PRESSURE SUPPORT)
PREFILLED_SYRINGE | INTRAVENOUS | Status: AC
  Filled 2014-07-11: qty 10

## 2014-07-11 MED ORDER — PHENYLEPHRINE HCL 10 MG/ML IJ SOLN
INTRAMUSCULAR | Status: DC | PRN
  Administered 2014-07-11: 120 ug via INTRAVENOUS
  Administered 2014-07-11: 160 ug via INTRAVENOUS

## 2014-07-11 MED ORDER — SUCCINYLCHOLINE CHLORIDE 20 MG/ML IJ SOLN
INTRAMUSCULAR | Status: AC
  Filled 2014-07-11: qty 2

## 2014-07-11 MED ORDER — ROCURONIUM BROMIDE 100 MG/10ML IV SOLN
INTRAVENOUS | Status: DC | PRN
  Administered 2014-07-11: 30 mg via INTRAVENOUS

## 2014-07-11 MED ORDER — ONDANSETRON HCL 4 MG/2ML IJ SOLN: 4.0000 mg | Freq: Four times a day (QID) | INTRAMUSCULAR | Status: DC | PRN

## 2014-07-11 MED ORDER — BUPIVACAINE HCL (PF) 0.25 % IJ SOLN
INTRAMUSCULAR | Status: AC
  Filled 2014-07-11: qty 30

## 2014-07-11 MED ORDER — SODIUM CHLORIDE 0.9 % IV SOLN: INTRAVENOUS | Status: DC

## 2014-07-11 MED ORDER — LACTATED RINGERS IV SOLN
INTRAVENOUS | Status: DC
  Administered 2014-07-11: 13:00:00 via INTRAVENOUS

## 2014-07-11 MED ORDER — PANTOPRAZOLE SODIUM 40 MG IV SOLR
40.0000 mg | Freq: Every day | INTRAVENOUS | Status: DC
  Administered 2014-07-11 – 2014-07-12 (×3): 40 mg via INTRAVENOUS
  Filled 2014-07-11 (×6): qty 40

## 2014-07-11 MED ORDER — ONDANSETRON HCL 4 MG/2ML IJ SOLN
INTRAMUSCULAR | Status: DC | PRN
  Administered 2014-07-11: 4 mg via INTRAVENOUS

## 2014-07-11 MED ORDER — ONDANSETRON HCL 4 MG/2ML IJ SOLN
INTRAMUSCULAR | Status: AC
  Filled 2014-07-11: qty 2

## 2014-07-11 MED ORDER — HYDROMORPHONE HCL 1 MG/ML IJ SOLN: 1.0000 mg | INTRAMUSCULAR | Status: DC | PRN

## 2014-07-11 MED ORDER — LACTATED RINGERS IV SOLN
INTRAVENOUS | Status: DC | PRN
  Administered 2014-07-11 (×2): via INTRAVENOUS

## 2014-07-11 MED ORDER — MIDAZOLAM HCL 5 MG/5ML IJ SOLN
INTRAMUSCULAR | Status: DC | PRN
  Administered 2014-07-11: 1 mg via INTRAVENOUS

## 2014-07-11 MED ORDER — LIDOCAINE HCL (CARDIAC) 20 MG/ML IV SOLN
INTRAVENOUS | Status: DC | PRN
  Administered 2014-07-11: 100 mg via INTRAVENOUS

## 2014-07-11 MED ORDER — ARTIFICIAL TEARS OP OINT
TOPICAL_OINTMENT | OPHTHALMIC | Status: AC
  Filled 2014-07-11: qty 3.5

## 2014-07-11 MED ORDER — PHENYLEPHRINE HCL 10 MG/ML IJ SOLN
10.0000 mg | INTRAVENOUS | Status: DC | PRN
  Administered 2014-07-11: 40 ug/min via INTRAVENOUS

## 2014-07-11 MED ORDER — KCL IN DEXTROSE-NACL 20-5-0.45 MEQ/L-%-% IV SOLN
INTRAVENOUS | Status: DC
  Administered 2014-07-11 – 2014-07-14 (×4): via INTRAVENOUS
  Filled 2014-07-11 (×12): qty 1000

## 2014-07-11 MED ORDER — MEPERIDINE HCL 25 MG/ML IJ SOLN: 6.2500 mg | INTRAMUSCULAR | Status: DC | PRN

## 2014-07-11 MED ORDER — CEFAZOLIN SODIUM-DEXTROSE 2-3 GM-% IV SOLR
INTRAVENOUS | Status: DC | PRN
  Administered 2014-07-11: 2 g via INTRAVENOUS

## 2014-07-11 SURGICAL SUPPLY — 56 items
APL SKNCLS STERI-STRIP NONHPOA (GAUZE/BANDAGES/DRESSINGS) ×1
APPLIER CLIP 5 13 M/L LIGAMAX5 (MISCELLANEOUS)
APR CLP MED LRG 5 ANG JAW (MISCELLANEOUS)
BAG SPEC RTRVL 10 TROC 200 (ENDOMECHANICALS) ×1
BENZOIN TINCTURE PRP APPL 2/3 (GAUZE/BANDAGES/DRESSINGS) ×3 IMPLANT
CANISTER SUCTION 2500CC (MISCELLANEOUS) ×3 IMPLANT
CHLORAPREP W/TINT 26ML (MISCELLANEOUS) ×3 IMPLANT
CLIP APPLIE 5 13 M/L LIGAMAX5 (MISCELLANEOUS) IMPLANT
CLIP LIGATING HEMO O LOK GREEN (MISCELLANEOUS) ×3 IMPLANT
CLOSURE WOUND 1/2 X4 (GAUZE/BANDAGES/DRESSINGS) ×1
COVER MAYO STAND STRL (DRAPES) ×3 IMPLANT
COVER SURGICAL LIGHT HANDLE (MISCELLANEOUS) ×3 IMPLANT
COVER TRANSDUCER ULTRASND (DRAPES) ×2 IMPLANT
DEVICE TROCAR PUNCTURE CLOSURE (ENDOMECHANICALS) ×3 IMPLANT
DRAPE C-ARM 42X72 X-RAY (DRAPES) ×3 IMPLANT
DRAPE LAPAROSCOPIC ABDOMINAL (DRAPES) ×3 IMPLANT
DRAPE UTILITY 15X26 W/TAPE STR (DRAPE) ×6 IMPLANT
ELECT REM PT RETURN 9FT ADLT (ELECTROSURGICAL) ×3
ELECTRODE REM PT RTRN 9FT ADLT (ELECTROSURGICAL) ×1 IMPLANT
ENDOLOOP SUT PDS II  0 18 (SUTURE) ×2
ENDOLOOP SUT PDS II 0 18 (SUTURE) IMPLANT
GAUZE SPONGE 2X2 8PLY STRL LF (GAUZE/BANDAGES/DRESSINGS) ×1 IMPLANT
GLOVE BIO SURGEON STRL SZ7.5 (GLOVE) ×3 IMPLANT
GLOVE BIOGEL PI IND STRL 7.0 (GLOVE) IMPLANT
GLOVE BIOGEL PI INDICATOR 7.0 (GLOVE) ×2
GLOVE SS BIOGEL STRL SZ 7 (GLOVE) IMPLANT
GLOVE SUPERSENSE BIOGEL SZ 7 (GLOVE) ×2
GOWN STRL REUS W/ TWL LRG LVL3 (GOWN DISPOSABLE) ×3 IMPLANT
GOWN STRL REUS W/ TWL XL LVL3 (GOWN DISPOSABLE) ×1 IMPLANT
GOWN STRL REUS W/TWL LRG LVL3 (GOWN DISPOSABLE) ×9
GOWN STRL REUS W/TWL XL LVL3 (GOWN DISPOSABLE) ×3
HEMOSTAT SNOW SURGICEL 2X4 (HEMOSTASIS) ×2 IMPLANT
IV CATH 14GX2 1/4 (CATHETERS) ×3 IMPLANT
KIT BASIN OR (CUSTOM PROCEDURE TRAY) ×3 IMPLANT
KIT ROOM TURNOVER OR (KITS) ×3 IMPLANT
NDL INSUFFLATION 14GA 120MM (NEEDLE) ×1 IMPLANT
NEEDLE INSUFFLATION 14GA 120MM (NEEDLE) ×3 IMPLANT
NS IRRIG 1000ML POUR BTL (IV SOLUTION) ×3 IMPLANT
PAD ARMBOARD 7.5X6 YLW CONV (MISCELLANEOUS) ×6 IMPLANT
POUCH RETRIEVAL ECOSAC 10 (ENDOMECHANICALS) IMPLANT
POUCH RETRIEVAL ECOSAC 10MM (ENDOMECHANICALS) ×2
SCISSORS LAP 5X35 DISP (ENDOMECHANICALS) ×3 IMPLANT
SET CHOLANGIOGRAPHY FRANKLIN (SET/KITS/TRAYS/PACK) ×3 IMPLANT
SET IRRIG TUBING LAPAROSCOPIC (IRRIGATION / IRRIGATOR) ×3 IMPLANT
SLEEVE ENDOPATH XCEL 5M (ENDOMECHANICALS) ×3 IMPLANT
SPECIMEN JAR SMALL (MISCELLANEOUS) ×3 IMPLANT
SPONGE GAUZE 2X2 STER 10/PKG (GAUZE/BANDAGES/DRESSINGS) ×2
STRIP CLOSURE SKIN 1/2X4 (GAUZE/BANDAGES/DRESSINGS) ×1 IMPLANT
SUT MNCRL AB 3-0 PS2 18 (SUTURE) ×3 IMPLANT
TAPE CLOTH SURG 4X10 WHT LF (GAUZE/BANDAGES/DRESSINGS) ×2 IMPLANT
TOWEL OR 17X24 6PK STRL BLUE (TOWEL DISPOSABLE) ×3 IMPLANT
TOWEL OR 17X26 10 PK STRL BLUE (TOWEL DISPOSABLE) ×3 IMPLANT
TRAY LAPAROSCOPIC (CUSTOM PROCEDURE TRAY) ×3 IMPLANT
TROCAR XCEL NON-BLD 11X100MML (ENDOMECHANICALS) ×3 IMPLANT
TROCAR XCEL NON-BLD 5MMX100MML (ENDOMECHANICALS) ×3 IMPLANT
TUBING INSUFFLATION (TUBING) ×3 IMPLANT

## 2014-07-11 NOTE — Anesthesia Procedure Notes (Addendum)
Procedure Name: Intubation Date/Time: 07/11/2014 2:11 PM Performed by: Carney Living Pre-anesthesia Checklist: Patient identified, Emergency Drugs available, Suction available, Patient being monitored and Timeout performed Patient Re-evaluated:Patient Re-evaluated prior to inductionOxygen Delivery Method: Circle system utilized Preoxygenation: Pre-oxygenation with 100% oxygen Intubation Type: IV induction, Rapid sequence and Cricoid Pressure applied Laryngoscope Size: Mac and 4 Grade View: Grade I Tube type: Oral Tube size: 8.0 mm Number of attempts: 1 Airway Equipment and Method: Stylet Placement Confirmation: ETT inserted through vocal cords under direct vision,  positive ETCO2 and breath sounds checked- equal and bilateral Secured at: 22 cm Tube secured with: Tape Dental Injury: Teeth and Oropharynx as per pre-operative assessment

## 2014-07-11 NOTE — Anesthesia Preprocedure Evaluation (Addendum)
Anesthesia Evaluation  Patient identified by MRN, date of birth, ID band Patient awake    Reviewed: Allergy & Precautions, H&P , NPO status , Patient's Chart, lab work & pertinent test results  Airway Mallampati: III  TM Distance: >3 FB Neck ROM: Full    Dental  (+) Teeth Intact, Dental Advisory Given   Pulmonary asthma ,          Cardiovascular negative cardio ROS      Neuro/Psych negative neurological ROS  negative psych ROS   GI/Hepatic Neg liver ROS, GERD-  ,Acute cholecystitis   Endo/Other  Morbid obesity  Renal/GU negative Renal ROS     Musculoskeletal negative musculoskeletal ROS (+)   Abdominal   Peds  Hematology negative hematology ROS (+)   Anesthesia Other Findings   Reproductive/Obstetrics                            Anesthesia Physical Anesthesia Plan  ASA: II  Anesthesia Plan: General   Post-op Pain Management:    Induction: Intravenous  Airway Management Planned: Oral ETT  Additional Equipment:   Intra-op Plan:   Post-operative Plan: Extubation in OR  Informed Consent: I have reviewed the patients History and Physical, chart, labs and discussed the procedure including the risks, benefits and alternatives for the proposed anesthesia with the patient or authorized representative who has indicated his/her understanding and acceptance.   Dental advisory given  Plan Discussed with: CRNA and Surgeon  Anesthesia Plan Comments:         Anesthesia Quick Evaluation

## 2014-07-11 NOTE — Progress Notes (Signed)
UR completed 

## 2014-07-11 NOTE — H&P (Signed)
Alex Atkinson is an 63 y.o. male.   Chief Complaint: Severe abdominal pain. HPI: The patient has been having symptoms since this past Friday. Looking back, however, the patient had an episode on 07/09/2014. At that time the pain lasted an hour to an hour and a half was associated with diaphoresis and an apparent fever. It abated at that time with no specific treatment.  The current episode started on Friday improved where he go to work on Saturday, then worsened again yesterday and continued to worsen today Torecan to the emergency room. An ultrasound demonstrated thickening of the gallbladder wall and gallbladder sludge. His white cell count is elevated and he has right upper quadrant tenderness with a positive Murphy sign. Surgery was contacted for consultation.  Past Medical History  Diagnosis Date  . PVC's (premature ventricular contractions)     caffeine induced; evaluated by Dr Aldona Bar  . Mononucleosis 1973  . Diverticulosis of colon 2005    Dr Fuller Plan    Past Surgical History  Procedure Laterality Date  . Atrthroscopy knee  1983 & 2007    R  . Knee arthroscopy  1992    L  . Colonoscopy with polypectomy  2005    polypoid colonic mucosa; Dr Fuller Plan  . Tonsillectomy and adenoidectomy      Family History  Problem Relation Age of Onset  . Lung cancer Father     cns metastases  . Stroke Maternal Grandmother 87  . Dementia Mother   . Leukemia Daughter     CML  . Diabetes Neg Hx   . Heart disease Neg Hx    Social History:  reports that he has never smoked. He does not have any smokeless tobacco history on file. He reports that he drinks alcohol. He reports that he does not use illicit drugs.  Allergies:  Allergies  Allergen Reactions  . Darvon Rash     (Not in a hospital admission)  Results for orders placed or performed during the hospital encounter of 07/10/14 (from the past 48 hour(s))  CBC with Differential     Status: Abnormal   Collection Time: 07/10/14  8:16  PM  Result Value Ref Range   WBC 14.8 (H) 4.0 - 10.5 K/uL   RBC 4.71 4.22 - 5.81 MIL/uL   Hemoglobin 14.1 13.0 - 17.0 g/dL   HCT 42.1 39.0 - 52.0 %   MCV 89.4 78.0 - 100.0 fL   MCH 29.9 26.0 - 34.0 pg   MCHC 33.5 30.0 - 36.0 g/dL   RDW 12.3 11.5 - 15.5 %   Platelets 253 150 - 400 K/uL   Neutrophils Relative % 88 (H) 43 - 77 %   Neutro Abs 13.0 (H) 1.7 - 7.7 K/uL   Lymphocytes Relative 7 (L) 12 - 46 %   Lymphs Abs 1.0 0.7 - 4.0 K/uL   Monocytes Relative 5 3 - 12 %   Monocytes Absolute 0.8 0.1 - 1.0 K/uL   Eosinophils Relative 0 0 - 5 %   Eosinophils Absolute 0.0 0.0 - 0.7 K/uL   Basophils Relative 0 0 - 1 %   Basophils Absolute 0.0 0.0 - 0.1 K/uL  Comprehensive metabolic panel     Status: Abnormal   Collection Time: 07/10/14  8:16 PM  Result Value Ref Range   Sodium 137 137 - 147 mEq/L   Potassium 4.4 3.7 - 5.3 mEq/L   Chloride 99 96 - 112 mEq/L   CO2 25 19 - 32 mEq/L  Glucose, Bld 121 (H) 70 - 99 mg/dL   BUN 23 6 - 23 mg/dL   Creatinine, Ser 1.03 0.50 - 1.35 mg/dL   Calcium 9.4 8.4 - 10.5 mg/dL   Total Protein 8.0 6.0 - 8.3 g/dL   Albumin 4.2 3.5 - 5.2 g/dL   AST 25 0 - 37 U/L   ALT 24 0 - 53 U/L   Alkaline Phosphatase 56 39 - 117 U/L   Total Bilirubin 0.7 0.3 - 1.2 mg/dL   GFR calc non Af Amer 75 (L) >90 mL/min   GFR calc Af Amer 87 (L) >90 mL/min    Comment: (NOTE) The eGFR has been calculated using the CKD EPI equation. This calculation has not been validated in all clinical situations. eGFR's persistently <90 mL/min signify possible Chronic Kidney Disease.    Anion gap 13 5 - 15  Lipase, blood     Status: None   Collection Time: 07/10/14  8:16 PM  Result Value Ref Range   Lipase 18 11 - 59 U/L  Urinalysis, Routine w reflex microscopic     Status: Abnormal   Collection Time: 07/10/14  8:17 PM  Result Value Ref Range   Color, Urine YELLOW YELLOW   APPearance CLEAR CLEAR   Specific Gravity, Urine 1.025 1.005 - 1.030   pH 5.5 5.0 - 8.0   Glucose, UA NEGATIVE  NEGATIVE mg/dL   Hgb urine dipstick SMALL (A) NEGATIVE   Bilirubin Urine NEGATIVE NEGATIVE   Ketones, ur 40 (A) NEGATIVE mg/dL   Protein, ur NEGATIVE NEGATIVE mg/dL   Urobilinogen, UA 1.0 0.0 - 1.0 mg/dL   Nitrite NEGATIVE NEGATIVE   Leukocytes, UA NEGATIVE NEGATIVE  Urine microscopic-add on     Status: Abnormal   Collection Time: 07/10/14  8:17 PM  Result Value Ref Range   Squamous Epithelial / LPF FEW (A) RARE   WBC, UA 0-2 <3 WBC/hpf   RBC / HPF 3-6 <3 RBC/hpf   Bacteria, UA FEW (A) RARE   Urine-Other MUCOUS PRESENT   I-stat troponin, ED (only if pt is 63 y.o. or older & pain is above umbilicus) - do not order at [facility]     Status: None   Collection Time: 07/10/14  8:36 PM  Result Value Ref Range   Troponin i, poc 0.00 0.00 - 0.08 ng/mL   Comment 3            Comment: Due to the release kinetics of cTnI, a negative result within the first hours of the onset of symptoms does not rule out myocardial infarction with certainty. If myocardial infarction is still suspected, repeat the test at appropriate intervals.    US Abdomen Limited  07/10/2014   CLINICAL DATA:  Right upper quadrant abdominal pain  EXAM: US ABDOMEN LIMITED - RIGHT UPPER QUADRANT  COMPARISON:  None.  FINDINGS: Gallbladder:  Sludge is layering within the gallbladder; no stone identified. The gallbladder is distended and there is mild wall thickening (5 mm) with striated appearance. There is no focal gallbladder tenderness, although the patient is reportedly medicated.  Common bile duct:  Diameter: 5 mm  Liver:  No focal lesion identified. Within normal limits in parenchymal echogenicity.  IMPRESSION: 1. Gallbladder wall thickening and mild edema, cannot exclude acute cholecystitis from occult stone. 2. Gallbladder sludge.   Electronically Signed   By: Jorje Guild M.D.   On: 07/10/2014 22:37    Review of Systems  Constitutional: Positive for fever and chills.  HENT: Negative.   Eyes:  Negative.   Respiratory:  Negative.   Cardiovascular: Negative.   Gastrointestinal: Positive for nausea, vomiting and abdominal pain.  Genitourinary: Negative.   Musculoskeletal: Negative.   Skin: Negative.   Neurological: Negative.   Endo/Heme/Allergies: Negative.   Psychiatric/Behavioral: Negative.     Blood pressure 151/93, pulse 86, temperature 98.1 F (36.7 C), temperature source Oral, resp. rate 22, height 6' (1.829 m), weight 121.564 kg (268 lb), SpO2 97 %. Physical Exam  Constitutional: He is oriented to person, place, and time. He appears well-developed and well-nourished.  HENT:  Head: Normocephalic and atraumatic.  Eyes: Conjunctivae and EOM are normal. Pupils are equal, round, and reactive to light.  Neck: Normal range of motion.  Cardiovascular: Normal rate, regular rhythm and normal heart sounds.   Respiratory: Effort normal and breath sounds normal.  GI: Soft. Normal appearance and bowel sounds are normal. There is tenderness. There is rebound and positive Murphy's sign. There is no rigidity and no guarding.  Musculoskeletal: Normal range of motion.  Neurological: He is alert and oriented to person, place, and time. He has normal reflexes.  Skin: Skin is dry.  Psychiatric: He has a normal mood and affect. His behavior is normal. Judgment and thought content normal.     Assessment/Plan Although the findings on the abdominal ultrasound are somewhat soft, the patient examination and his history are consistent with acute cholecystitis. He has a positive Murphy sign, and he has thickening of the gallbladder wall and an ultrasound. There are no definitive stones seen however there was significant sludge.  I'll admit the patient for IV antibiotics, IV hydration, and pain control. He will be set up for a laparoscopic cholecystectomy tomorrow by the operating surgeon a week. The risks and benefits have been explained to the patient and his wife and they wish to proceed.  Jasmine Mcbeth, JAY 07/11/2014, 12:20  AM

## 2014-07-11 NOTE — Transfer of Care (Signed)
Immediate Anesthesia Transfer of Care Note  Patient: Alex Atkinson  Procedure(s) Performed: Procedure(s): LAPAROSCOPIC CHOLECYSTECTOMY  (N/A)  Patient Location: PACU  Anesthesia Type:General  Level of Consciousness: lethargic and responds to stimulation  Airway & Oxygen Therapy: Patient Spontanous Breathing and Patient connected to nasal cannula oxygen  Post-op Assessment: Report given to PACU RN  Post vital signs: Reviewed and stable  Complications: No apparent anesthesia complications

## 2014-07-11 NOTE — Anesthesia Postprocedure Evaluation (Signed)
Anesthesia Post Note  Patient: Alex Atkinson  Procedure(s) Performed: Procedure(s) (LRB): LAPAROSCOPIC CHOLECYSTECTOMY  (N/A)  Anesthesia type: general  Patient location: PACU  Post pain: Pain level controlled  Post assessment: Patient's Cardiovascular Status Stable  Last Vitals:  Filed Vitals:   07/11/14 1638  BP: 120/72  Pulse: 90  Temp: 36.7 C  Resp: 19    Post vital signs: Reviewed and stable  Level of consciousness: sedated  Complications: No apparent anesthesia complications

## 2014-07-11 NOTE — Op Note (Addendum)
07/11/2014  3:10 PM  PATIENT:  Alex Atkinson  63 y.o. male  PRE-OPERATIVE DIAGNOSIS:  Acute cholecystitis  POST-OPERATIVE DIAGNOSIS:  Acute cholecystitis  PROCEDURE:  Procedure(s): LAPAROSCOPIC CHOLECYSTECTOMY  (N/A)  SURGEON:  Surgeon(s) and Role:    * Ralene Ok, MD - Primary  ASSISTANTS: none   ANESTHESIA:   local and general  EBL:  Total I/O In: 1000 [I.V.:1000] Out: 350 [Urine:350]  BLOOD ADMINISTERED:none  DRAINS: none   LOCAL MEDICATIONS USED:  BUPIVICAINE   SPECIMEN:  Source of Specimen:  gallbaldder  DISPOSITION OF SPECIMEN:  PATHOLOGY  COUNTS:  YES  TOURNIQUET:  * No tourniquets in log *  DICTATION: .Dragon Dictation  Findings: The patient had acute inflammed gallbladder  Indications for procedure: 63 y/o M with US revealing acute ccy  Details of the procedure:  The patient was taken to the operating and placed in the supine position with bilateral SCDs in place.  The patient was prepped and draped in the usual sterile fashion. A time out was called and all facts were verified. A pneumoperitoneum was obtained via A Veress needle technique to a pressure of 62mm of mercury. A 79mm trochar was then placed in the right upper quadrant under visualization, and there were no injuries to any abdominal organs. A 11 mm port was then placed in the umbilical region after infiltrating with local anesthesia under direct visualization. A second and third epigastric port and right lower quadrant port placement under direct visualization, respectively. The gallbladder was identified and retracted, the peritoneum was then sharply dissected from the gallbladder and this dissection was carried down to Calot's triangle. The gallbladder was identified and stripped away circumferentially and seen going into the gallbladder 360. Secondary to the size of the cystic duct, it was transected and an Endoloop was placed to secure it. The cystic artery was identified and 2 clips  placed proximally and one distally and transected.  We then proceeded to remove the gallbladder off the hepatic fossa with Bovie cautery. A retrieval  bag was then placed in the abdomen and gallbladder placed in the bag. The hepatic fossa was then reexamined and hemostasis was achieved with Bovie cautery and was excellent at the end of the case. The subhepatic fossa and perihepatic fossa was then irrigated until the effluent was clear. The 11 mm trocar fascia was reapproximated with the Endo Close #1 Vicryl x2 .  The pneumoperitoneum was evacuated and all trochars removed under direct visulalization.  The skin was then closed with 4-0 Monocryl and the skin dressed with Steri-Strips, gauze, and tape.  The patient was awaken from general anesthesia and taken to the recovery room in stable condition.   PLAN OF CARE: admitted to inpt  PATIENT DISPOSITION:  PACU - hemodynamically stable.   Delay start of Pharmacological VTE agent (>24hrs) due to surgical blood loss or risk of bleeding: not applicable

## 2014-07-12 ENCOUNTER — Encounter (HOSPITAL_COMMUNITY): Payer: Self-pay | Admitting: *Deleted

## 2014-07-12 ENCOUNTER — Encounter (HOSPITAL_COMMUNITY): Admission: EM | Disposition: A | Discharge status: Home / Self Care | Payer: Self-pay | Source: Home / Self Care

## 2014-07-12 ENCOUNTER — Observation Stay (HOSPITAL_COMMUNITY): Payer: BC Managed Care – PPO | Admitting: Anesthesiology

## 2014-07-12 ENCOUNTER — Observation Stay (HOSPITAL_COMMUNITY): Payer: BC Managed Care – PPO

## 2014-07-12 DIAGNOSIS — K830 Cholangitis: Secondary | ICD-10-CM

## 2014-07-12 DIAGNOSIS — K8063 Calculus of gallbladder and bile duct with acute cholecystitis with obstruction: Secondary | ICD-10-CM

## 2014-07-12 DIAGNOSIS — R7989 Other specified abnormal findings of blood chemistry: Secondary | ICD-10-CM | POA: Diagnosis present

## 2014-07-12 DIAGNOSIS — R945 Abnormal results of liver function studies: Secondary | ICD-10-CM | POA: Diagnosis present

## 2014-07-12 DIAGNOSIS — K8309 Other cholangitis: Secondary | ICD-10-CM | POA: Diagnosis present

## 2014-07-12 HISTORY — PX: ENDOSCOPIC RETROGRADE CHOLANGIOPANCREATOGRAPHY (ERCP) WITH PROPOFOL: SHX5810

## 2014-07-12 LAB — COMPREHENSIVE METABOLIC PANEL
ALT: 102 U/L — ABNORMAL HIGH (ref 0–53)
AST: 121 U/L — ABNORMAL HIGH (ref 0–37)
Albumin: 3.2 g/dL — ABNORMAL LOW (ref 3.5–5.2)
Alkaline Phosphatase: 72 U/L (ref 39–117)
Anion gap: 12 (ref 5–15)
BILIRUBIN TOTAL: 6.7 mg/dL — AB (ref 0.3–1.2)
BUN: 13 mg/dL (ref 6–23)
CHLORIDE: 96 meq/L (ref 96–112)
CO2: 24 meq/L (ref 19–32)
Calcium: 8.6 mg/dL (ref 8.4–10.5)
Creatinine, Ser: 0.96 mg/dL (ref 0.50–1.35)
GFR, EST NON AFRICAN AMERICAN: 86 mL/min — AB (ref >90)
Glucose, Bld: 120 mg/dL — ABNORMAL HIGH (ref 70–99)
Potassium: 4.7 mEq/L (ref 3.7–5.3)
Sodium: 132 mEq/L — ABNORMAL LOW (ref 137–147)
Total Protein: 6.7 g/dL (ref 6.0–8.3)

## 2014-07-12 SURGERY — ENDOSCOPIC RETROGRADE CHOLANGIOPANCREATOGRAPHY (ERCP) WITH PROPOFOL
Anesthesia: General

## 2014-07-12 MED ORDER — MIDAZOLAM HCL 5 MG/5ML IJ SOLN
INTRAMUSCULAR | Status: DC | PRN
  Administered 2014-07-12: 2 mg via INTRAVENOUS

## 2014-07-12 MED ORDER — GLUCAGON HCL RDNA (DIAGNOSTIC) 1 MG IJ SOLR
INTRAMUSCULAR | Status: DC | PRN
  Administered 2014-07-12: 1 mg via INTRAVENOUS

## 2014-07-12 MED ORDER — EPHEDRINE SULFATE 50 MG/ML IJ SOLN
INTRAMUSCULAR | Status: DC | PRN
  Administered 2014-07-12: 10 mg via INTRAVENOUS

## 2014-07-12 MED ORDER — LIDOCAINE HCL (CARDIAC) 20 MG/ML IV SOLN
INTRAVENOUS | Status: DC | PRN
  Administered 2014-07-12: 50 mg via INTRAVENOUS

## 2014-07-12 MED ORDER — SODIUM CHLORIDE 0.9 % IJ SOLN
INTRAMUSCULAR | Status: DC | PRN
  Administered 2014-07-12: 13:00:00

## 2014-07-12 MED ORDER — PHENYLEPHRINE HCL 10 MG/ML IJ SOLN
INTRAMUSCULAR | Status: DC | PRN
  Administered 2014-07-12: 120 ug via INTRAVENOUS

## 2014-07-12 MED ORDER — ACETAMINOPHEN 325 MG PO TABS
650.0000 mg | ORAL_TABLET | Freq: Four times a day (QID) | ORAL | Status: DC | PRN
  Administered 2014-07-12 – 2014-07-14 (×4): 650 mg via ORAL
  Filled 2014-07-12 (×3): qty 2

## 2014-07-12 MED ORDER — ALBUMIN HUMAN 5 % IV SOLN
INTRAVENOUS | Status: DC | PRN
  Administered 2014-07-12: 12:00:00 via INTRAVENOUS

## 2014-07-12 MED ORDER — LACTATED RINGERS IV SOLN
INTRAVENOUS | Status: DC | PRN
  Administered 2014-07-12 (×2): via INTRAVENOUS

## 2014-07-12 MED ORDER — FENTANYL CITRATE 0.05 MG/ML IJ SOLN
INTRAMUSCULAR | Status: DC | PRN
  Administered 2014-07-12: 50 ug via INTRAVENOUS

## 2014-07-12 MED ORDER — ONDANSETRON HCL 4 MG/2ML IJ SOLN
INTRAMUSCULAR | Status: DC | PRN
  Administered 2014-07-12: 4 mg via INTRAVENOUS

## 2014-07-12 MED ORDER — HYDROMORPHONE HCL 1 MG/ML IJ SOLN
0.5000 mg | INTRAMUSCULAR | Status: DC | PRN
  Administered 2014-07-12: 1 mg via INTRAVENOUS
  Filled 2014-07-12: qty 1

## 2014-07-12 MED ORDER — HYDROCODONE-ACETAMINOPHEN 5-325 MG PO TABS: 1.0000 | ORAL_TABLET | Freq: Four times a day (QID) | ORAL | Status: DC | PRN

## 2014-07-12 MED ORDER — INDOMETHACIN 50 MG RE SUPP
100.0000 mg | Freq: Once | RECTAL | Status: AC
  Administered 2014-07-12: 100 mg via RECTAL
  Filled 2014-07-12: qty 2

## 2014-07-12 MED ORDER — SUCCINYLCHOLINE CHLORIDE 20 MG/ML IJ SOLN
INTRAMUSCULAR | Status: DC | PRN
  Administered 2014-07-12: 120 mg via INTRAVENOUS

## 2014-07-12 MED ORDER — NEOSTIGMINE METHYLSULFATE 10 MG/10ML IV SOLN
INTRAVENOUS | Status: DC | PRN
  Administered 2014-07-12: 4 mg via INTRAVENOUS

## 2014-07-12 MED ORDER — GLYCOPYRROLATE 0.2 MG/ML IJ SOLN
INTRAMUSCULAR | Status: DC | PRN
  Administered 2014-07-12: .8 mg via INTRAVENOUS

## 2014-07-12 MED ORDER — PROPOFOL 10 MG/ML IV BOLUS
INTRAVENOUS | Status: DC | PRN
  Administered 2014-07-12: 150 mg via INTRAVENOUS

## 2014-07-12 MED ORDER — ROCURONIUM BROMIDE 100 MG/10ML IV SOLN
INTRAVENOUS | Status: DC | PRN
  Administered 2014-07-12: 30 mg via INTRAVENOUS

## 2014-07-12 MED ORDER — HYDROCODONE-ACETAMINOPHEN 5-325 MG PO TABS: 1.0000 | ORAL_TABLET | ORAL | Status: DC | PRN

## 2014-07-12 NOTE — Op Note (Signed)
[facility] [place], [address]   ERCP PROCEDURE REPORT        EXAM DATE: 07/12/2014  PATIENT NAME:          Alex Atkinson, Alex Atkinson          MR #: [id number] BIRTHDATE:       11-23-1950     VISIT #:     [phone] ATTENDING:     Inda Castle, MD     STATUS:     inpatient ASSISTANT:      Sharon Mt and Cleda Daub  INDICATIONS:  The patient is a 63 yr old male here for an ERCP due to jaundice. postcholecystectomy PROCEDURE PERFORMED:     ERCP with sphincterotomy/papillotomy ERCP with removal of calculus/calculi MEDICATIONS:     Per Anesthesia  CONSENT: The patient understands the risks and benefits of the procedure and understands that these risks include, but are not limited to: sedation, allergic reaction, infection, perforation and/or bleeding. Alternative means of evaluation and treatment include, among others: physical exam, x-rays, and/or surgical intervention. The patient elects to proceed with this endoscopic procedure.  DESCRIPTION OF PROCEDURE: During intra-op preparation period all mechanical & medical equipment was checked for proper function. Hand hygiene and appropriate measures for infection prevention was taken. After the risks, benefits and alternatives of the procedure were thoroughly explained, Informed was verified, confirmed and timeout was successfully executed by the treatment team. With the patient in left semi-prone position, medications were administered intravenously.The Pentax Ercp Scope [id number] was passed from the mouth into the esophagus and further advanced from the esophagus into the stomach. From stomach scope was directed to the second portion of the duodenum.  Major papilla was aligned with the duodenoscope. The scope position was confirmed fluoroscopically. Rest of the findings/therapeutics are given below. The scope was then completely withdrawn from the patient and the  procedure completed. The pulse, BP, and O2 saturation were monitored and documented by the physician and the nursing staff throughout the entire procedure. The patient was cared for as planned according to standard protocol. The patient was then discharged to recovery in stable condition and with appropriate post procedure care.  There was a large periampullary diverticulum.   Common bile duct was directly cannulated with a 0.35 mm guidewire.  did not demonstrate any frank stones. A 12 mm sphincterotomy was made.  A stone spontaneously passed from the papilla consistent with an impacted stone.  A large amount of bile and pus extruded from the bile duct. The bile duct was swept with a 12 mm stone extractor.  Small stones and a large amount of sludge was extracted from the papilla as well as pus. Final cholangiogram was normal.    ADVERSE EVENT: IMPRESSIONS:     1.  Large periampullary diverticulum 2.  Impacted bile duct stone, small bile duct stones and sludge 3.  cholangitis  RECOMMENDATIONS:     continue antibiotics REPEAT EXAM:   ___________________________________ Inda Castle, MD eSigned:  Inda Castle, MD 07/12/2014 1:15 PM   cc: Unice Cobble, MD Holly Bodily, MD  CPT CODES: ICD9 CODES:  The ICD and CPT codes recommended by this software are interpretations from the data that the clinical staff has captured with the software.  The verification of the translation of this report to the ICD and CPT codes and modifiers is the sole responsibility of the health care institution and practicing physician where this report was generated.  [employer] will not be held responsible for the validity of the ICD and CPT codes included on this report.  AMA assumes no liability for data contained or not contained herein. CPT is a Designer, television/film set of the [employer].   PATIENT NAME:  Alex Atkinson, Alex Atkinson MR#: [id number]

## 2014-07-12 NOTE — Anesthesia Procedure Notes (Signed)
Procedure Name: Intubation Date/Time: 07/12/2014 12:06 PM Performed by: Neldon Newport Pre-anesthesia Checklist: Emergency Drugs available, Patient identified, Suction available, Patient being monitored and Timeout performed Patient Re-evaluated:Patient Re-evaluated prior to inductionOxygen Delivery Method: Circle system utilized Preoxygenation: Pre-oxygenation with 100% oxygen Intubation Type: IV induction, Rapid sequence and Cricoid Pressure applied Laryngoscope Size: Mac and 3 Grade View: Grade I Tube type: Oral Tube size: 7.5 mm Placement Confirmation: ETT inserted through vocal cords under direct vision,  positive ETCO2 and breath sounds checked- equal and bilateral Secured at: 22 cm Tube secured with: Tape Dental Injury: Teeth and Oropharynx as per pre-operative assessment

## 2014-07-12 NOTE — Anesthesia Preprocedure Evaluation (Addendum)
Anesthesia Evaluation   Patient awake    Reviewed: Allergy & Precautions, H&P , NPO status   History of Anesthesia Complications Negative for: history of anesthetic complications  Airway Mallampati: II  TM Distance: >3 FB Neck ROM: Full    Dental  (+) Teeth Intact, Dental Advisory Given   Pulmonary neg pulmonary ROS,    Pulmonary exam normal       Cardiovascular negative cardio ROS      Neuro/Psych negative neurological ROS  negative psych ROS   GI/Hepatic Neg liver ROS,   Endo/Other  Morbid obesity  Renal/GU negative Renal ROS     Musculoskeletal   Abdominal   Peds  Hematology   Anesthesia Other Findings   Reproductive/Obstetrics                            Anesthesia Physical Anesthesia Plan  ASA: III  Anesthesia Plan: General   Post-op Pain Management:    Induction: Intravenous  Airway Management Planned: Oral ETT  Additional Equipment:   Intra-op Plan:   Post-operative Plan: Extubation in OR  Informed Consent: I have reviewed the patients History and Physical, chart, labs and discussed the procedure including the risks, benefits and alternatives for the proposed anesthesia with the patient or authorized representative who has indicated his/her understanding and acceptance.   Dental advisory given  Plan Discussed with: CRNA, Anesthesiologist and Surgeon  Anesthesia Plan Comments:        Anesthesia Quick Evaluation

## 2014-07-12 NOTE — Anesthesia Postprocedure Evaluation (Signed)
Anesthesia Post Note  Patient: Alex Atkinson  Procedure(s) Performed: Procedure(s) (LRB): ENDOSCOPIC RETROGRADE CHOLANGIOPANCREATOGRAPHY (ERCP) WITH PROPOFOL (N/A)  Anesthesia type: general  Patient location: PACU  Post pain: Pain level controlled  Post assessment: Patient's Cardiovascular Status Stable  Last Vitals:  Filed Vitals:   07/12/14 1305  BP: 137/81  Pulse: 96  Temp:   Resp: 20    Post vital signs: Reviewed and stable  Level of consciousness: sedated  Complications: No apparent anesthesia complications

## 2014-07-12 NOTE — Transfer of Care (Signed)
Immediate Anesthesia Transfer of Care Note  Patient: Alex Atkinson  Procedure(s) Performed: Procedure(s): ENDOSCOPIC RETROGRADE CHOLANGIOPANCREATOGRAPHY (ERCP) WITH PROPOFOL (N/A)  Patient Location: Endoscopy Unit  Anesthesia Type:General  Level of Consciousness: awake, alert  and oriented  Airway & Oxygen Therapy: Patient Spontanous Breathing and Patient connected to nasal cannula oxygen  Post-op Assessment: Report given to PACU RN, Post -op Vital signs reviewed and stable and Patient moving all extremities X 4  Post vital signs: Reviewed and stable  Complications: No apparent anesthesia complications

## 2014-07-12 NOTE — Consult Note (Signed)
[facility] Consult: 9:26 AM 07/12/2014  LOS: 2 days    Referring Provider: Dr. Rosendo Gros  Primary Care Physician:  Simona Huh, MD Primary Gastroenterologist:  Dr. Fuller Plan     Reason for Consultation:  Jaundice following laparoscopic cholecystectomy of 1124, suspicion for bile duct obstruction   HPI: Alex Atkinson is a 63 y.o. male.  Patient is a Animal nutritionist.   he has previously undergone screening colonoscopy in 2005 with findings of diverticulosis and colon polyp.  However unable to locate the colonoscopy or any associated pathology reports.  He is rescheduled first screening colonoscopy in February 2016.   Patient underwent laparoscopic cholecystectomy yesterday by Dr. Rosendo Gros.  Surgery was uneventful. However the cystic duct was "dead" and thus it was impossible to obtain intraoperative cholangiogram.  Patient had presented with acute pain of right upper quadrant which started on the 11/20. By the following day it had improved but recurred on 1123 and did not subside. Ultrasound showed gallbladder thickening and sludge.  the common bile duct diameter was 5 mm. White blood cell count was 14.8. LFTs on the 23rd and 24th were all normal.    Following surgery the urine became dark in color. He was sore from the surgery but began to develop pain in the left upper quadrant different and more intense than previously. He had fever to 101.9. Oxygen saturation declined to low 90s. Blood pressure and pulse were stable.  Labs were repeated this morning and show rise in transaminases, total bilirubin is up to 6.7.  Lipase has not been really assayed but it was 18 when he presented to the ED.    Past Medical History  Diagnosis Date  . PVC's (premature ventricular contractions)     caffeine induced; evaluated by Dr  Aldona Bar  . Mononucleosis 1973  . Diverticulosis of colon 2005    Dr Fuller Plan    Past Surgical History  Procedure Laterality Date  . Atrthroscopy knee  1983 & 2007    R  . Knee arthroscopy  1992    L  . Colonoscopy with polypectomy  2005    polypoid colonic mucosa; Dr Fuller Plan  . Tonsillectomy and adenoidectomy      Prior to Admission medications   Medication Sig Start Date End Date Taking? Authorizing Provider  calcium carbonate (TUMS - DOSED IN MG ELEMENTAL CALCIUM) 500 MG chewable tablet Chew 1 tablet by mouth daily.   Yes Historical Provider, MD  famotidine (PEPCID) 20 MG tablet Take 20 mg by mouth as needed for heartburn or indigestion.   Yes Historical Provider, MD  ibuprofen (ADVIL,MOTRIN) 400 MG tablet Take 400 mg by mouth as needed.     Yes Historical Provider, MD  albuterol (PROVENTIL HFA;VENTOLIN HFA) 108 (90 BASE) MCG/ACT inhaler Inhale 2 puffs into the lungs every 6 (six) hours as needed for wheezing or shortness of breath. 10/16/13   Gregor Hams, MD  HYDROcodone-acetaminophen (NORCO/VICODIN) 5-325 MG per tablet Take 1-2 tablets by mouth every 6 (six) hours as needed for moderate pain or severe pain. 07/12/14   Megan  N Dort, PA-C    Scheduled Meds: . cefTRIAXone (ROCEPHIN)  IV  1 g Intravenous Q24H  . enoxaparin (LOVENOX) injection  40 mg Subcutaneous Q24H  . pantoprazole (PROTONIX) IV  40 mg Intravenous QHS   Infusions: . dextrose 5 % and 0.45 % NaCl with KCl 20 mEq/L 100 mL/hr at 07/12/14 0908   PRN Meds: acetaminophen, albuterol, HYDROcodone-acetaminophen, HYDROmorphone (DILAUDID) injection, ondansetron   Allergies as of 07/10/2014 - Review Complete 07/10/2014  Allergen Reaction Noted  . Darvon Rash 05/09/2011    Family History  Problem Relation Age of Onset  . Lung cancer Father     cns metastases  . Stroke Maternal Grandmother 87  . Dementia Mother   . Leukemia Daughter     CML  . Diabetes Neg Hx   . Heart disease Neg Hx     History   Social  History  . Marital Status: Married    Spouse Name: N/A    Number of Children: N/A  . Years of Education: N/A   Occupational History  . Not on file.   Social History Main Topics  . Smoking status: Never Smoker   . Smokeless tobacco: Not on file  . Alcohol Use: 0.0 oz/week  . Drug Use: No  . Sexual Activity: Not on file   Other Topics Concern  . Not on file   Social History Narrative    REVIEW OF SYSTEMS: Constitutional:Patient is generally fit and well and has no limits to activity ENT:  No nose bleeds Pulm:  Previously no shortness of breath, but this morning he has had some spells of dyspnea and coughing  Cardiovascular :  No palpitations, no LE edema.  no chest painematuria, no frequency GI:  no dysphagia, no heartburn, no alteration in bowel habits.  Transfusions:  None Heme:  No unusual bleeding or bruising Neuro:  No headaches, no peripheral tingling or numbness Derm:  No itching, no rash or sores.  Endocrine:  No sweats or chills.  No polyuria or dysuria.  Urine is dark within the last 10-12 hours.  Immunization:did not inquire [place]:  None beyond local counties in last few months.    PHYSICAL EXAM: Vital signs in last 24 hours: Filed Vitals:   07/12/14 0736  BP: 131/70  Pulse: 92  Temp: 99.8 F (37.7 C) max 101.9  Resp: 18   Wt Readings from Last 3 Encounters:  07/10/14 268 lb (121.564 kg)  10/26/13 280 lb 1.3 oz (127.043 kg)  07/23/12 270 lb 12.8 oz (122.834 kg)    General: diaphoretic, unwell and somewhat toxic appearing white male who is uncomfortable  Head:  No swelling, no asymmetry, no signs of trauma Ears:  Not hard of hearing ENT:  No congestion, no discharge Eyes:  Slight icterus.  Mouth: Mucosa is clear, moist, dentition in good repair  Neck:   no JVD, no thyromegaly, no bruits, no massesngs:   Pulmonary:  paroxysmal Cough with dyspnea Heart: RRR, no mrg,  Abdomen:  Soft, slight distended, moderate tenderness across upper abdomen.  No  guard/rebound.   Rectal: deferred   Musc/Skeltl: no joint swelling, deformity or redness Extremities:  No CCE.  Feet warm with 3+ pedal pulses  Neurologic:  Oriented x 3.  No limb weakness Skin:  Slight jaundice Tattoos:  none Nodes:  No cervical adenopathy   Psych:  Somewhat flat affect, appropriate for pt who is acutely ill.    Intake/Output from previous day: 11/24 0701 - 11/25 0700 In: 2733.7 [P.O.:360; I.V.:2323.7;  IV Piggyback:50] Out: 825 [Urine:825] Intake/Output this shift: Total I/O In: -  Out: 300 [Urine:300]  LAB RESULTS:  Recent Labs  07/10/14 2016 07/11/14 0459  WBC 14.8* 12.8*  HGB 14.1 13.3  HCT 42.1 39.7  PLT 253 211   BMET Lab Results  Component Value Date   NA 132* 07/12/2014   NA 134* 07/11/2014   NA 137 07/10/2014   K 4.7 07/12/2014   K 3.9 07/11/2014   K 4.4 07/10/2014   CL 96 07/12/2014   CL 97 07/11/2014   CL 99 07/10/2014   CO2 24 07/12/2014   CO2 22 07/11/2014   CO2 25 07/10/2014   GLUCOSE 120* 07/12/2014   GLUCOSE 132* 07/11/2014   GLUCOSE 121* 07/10/2014   BUN 13 07/12/2014   BUN 17 07/11/2014   BUN 23 07/10/2014   CREATININE 0.96 07/12/2014   CREATININE 0.81 07/11/2014   CREATININE 1.03 07/10/2014   CALCIUM 8.6 07/12/2014   CALCIUM 8.6 07/11/2014   CALCIUM 9.4 07/10/2014   LFT  Recent Labs  07/10/14 2016 07/11/14 0459 07/12/14 0547  PROT 8.0 7.2 6.7  ALBUMIN 4.2 3.6 3.2*  AST 25 21 121*  ALT 24 19 102*  ALKPHOS 56 52 72  BILITOT 0.7 0.8 6.7*   PT/INR No results found for: INR, PROTIME Hepatitis Panel No results for input(s): HEPBSAG, HCVAB, HEPAIGM, HEPBIGM in the last 72 hours. C-Diff No components found for: CDIFF Lipase     Component Value Date/Time   LIPASE 18 07/10/2014 2016    Drugs of Abuse  No results found for: LABOPIA, COCAINSCRNUR, LABBENZ, AMPHETMU, THCU, LABBARB   RADIOLOGY STUDIES: US Abdomen Limited  07/10/2014   CLINICAL DATA:  Right upper quadrant abdominal pain  EXAM: US ABDOMEN  LIMITED - RIGHT UPPER QUADRANT  COMPARISON:  None.  FINDINGS: Gallbladder:  Sludge is layering within the gallbladder; no stone identified. The gallbladder is distended and there is mild wall thickening (5 mm) with striated appearance. There is no focal gallbladder tenderness, although the patient is reportedly medicated.  Common bile duct:  Diameter: 5 mm  Liver:  No focal lesion identified. Within normal limits in parenchymal echogenicity.  IMPRESSION: 1. Gallbladder wall thickening and mild edema, cannot exclude acute cholecystitis from occult stone. 2. Gallbladder sludge.   Electronically Signed   By: Jorje Guild M.D.   On: 07/10/2014 22:37    ENDOSCOPIC STUDIES: Colonoscopy in 2005.   IMPRESSION:   *  Less than 24 hours status post laparoscopic cholecystectomy for acute cholecystitis.   *  Fever, worsening abdominal pain, new elevation of LFTs. Rule out common bile duct sludge/stones, rule out biliary leak. No evidence for pancreatitis at the time of presentation. Need to check lipase in order to rule out pancreatitis.     PLAN:     *  Making arrangements for ERCP this afternoon. The procedure, patient situation, risks and benefits of the procedure were discussed with the patient and his wife using smart phone based anatomic diagram. They are eager and willing to proceed.  *  Patient is receiving Rocephin so will not add additional antibiotics. Did order indomethacin to be administered perioperatively.  *  We'll check lipase preoperatively. Lipase lipase    Azucena Freed  07/12/2014, 9:26 AM Pager: [phone]  GI Attending Note   Chart was reviewed and patient was examined. X-rays and lab were reviewed.    I agree with management and plans.  Strongly suspect retained bile duct stones causing obstruction.  Plan to proceed  with ERCP.  The risks, benefits, and possible complications of the procedure, including  But not limited to bleeding, perforation, surgery, and the 5-10% risk  for pancreatitis, were explained to the patient.  It was explained that  post ERCP pancreatitis which, while usually is mild, occasionally maybe severe and even life-threatening.  Patient's questions were answered.   Sandy Salaam. Deatra Ina, M.D., [facility] Cell [phone]

## 2014-07-12 NOTE — Progress Notes (Signed)
ERCP demonstrated an impacted stone, small bile duct stones/sludge and frank cholangitis (pus in bile duct).  Duct is well drained following sphincterotomy and stone extraction.  Recommend 1.  Advance diet as tolerated 2.  Continue antibiotics

## 2014-07-12 NOTE — Discharge Instructions (Signed)
Your appointment is at 3:30pm, please arrive at least 30 min before your appointment to complete your check in paperwork.  If you are unable to arrive 30 min prior to your appointment time we may have to cancel or reschedule you.  LAPAROSCOPIC SURGERY: POST OP INSTRUCTIONS  1. DIET: Follow a light bland diet the first 24 hours after arrival home, such as soup, liquids, crackers, etc. Be sure to include lots of fluids daily. Avoid fast food or heavy meals as your are more likely to get nauseated. Eat a low fat the next few days after surgery.  2. Take your usually prescribed home medications unless otherwise directed. 3. PAIN CONTROL:  1. Pain is best controlled by a usual combination of three different methods TOGETHER:  1. Ice/Heat 2. Over the counter pain medication 3. Prescription pain medication 2. Most patients will experience some swelling and bruising around the incisions. Ice packs or heating pads (30-60 minutes up to 6 times a day) will help. Use ice for the first few days to help decrease swelling and bruising, then switch to heat to help relax tight/sore spots and speed recovery. Some people prefer to use ice alone, heat alone, alternating between ice & heat. Experiment to what works for you. Swelling and bruising can take several weeks to resolve.  3. It is helpful to take an over-the-counter pain medication regularly for the first few weeks. Choose one of the following that works best for you:  1. Naproxen (Aleve, etc) Two 220mg  tabs twice a day 2. Ibuprofen (Advil, etc) Three 200mg  tabs four times a day (every meal & bedtime) 3. Acetaminophen (Tylenol, etc) 500-650mg  four times a day (every meal & bedtime) 4. A prescription for pain medication (such as oxycodone, hydrocodone, etc) should be given to you upon discharge. Take your pain medication as prescribed.  1. If you are having problems/concerns with the prescription medicine (does not control pain, nausea, vomiting, rash, itching,  etc), please call us [phone] to see if we need to switch you to a different pain medicine that will work better for you and/or control your side effect better. 2. If you need a refill on your pain medication, please contact your pharmacy. They will contact our office to request authorization. Prescriptions will not be filled after 5 pm or on week-ends. 4. Avoid getting constipated. Between the surgery and the pain medications, it is common to experience some constipation. Increasing fluid intake and taking a fiber supplement (such as Metamucil, Citrucel, FiberCon, MiraLax, etc) 1-2 times a day regularly will usually help prevent this problem from occurring. A mild laxative (prune juice, Milk of Magnesia, MiraLax, etc) should be taken according to package directions if there are no bowel movements after 48 hours.  5. Watch out for diarrhea. If you have many loose bowel movements, simplify your diet to bland foods & liquids for a few days. Stop any stool softeners and decrease your fiber supplement. Switching to mild anti-diarrheal medications (Kayopectate, Pepto Bismol) can help. If this worsens or does not improve, please call us. 6. Wash / shower every day. You may shower over the dressings as they are waterproof. Continue to shower over incision(s) after the dressing is off. 7. Remove your waterproof bandages 5 days after surgery. You may leave the incision open to air. You may replace a dressing/Band-Aid to cover the incision for comfort if you wish.  8. ACTIVITIES as tolerated:  1. You may resume regular (light) daily activities beginning the next day--such as  walking, climbing stairs--gradually increasing activities as tolerated. If you can walk 30 minutes without difficulty, it is safe to try more intense activity such as jogging, treadmill, bicycling, low-impact aerobics, swimming, etc. °2. Save the most intensive and strenuous activity for last such as sit-ups, heavy lifting,  contact sports, etc Refrain from any heavy lifting or straining until you are off narcotics for pain control.  °3. DO NOT PUSH THROUGH PAIN. Let pain be your guide: If it hurts to do something, don't do it. Pain is your body warning you to avoid that activity for another week until the pain goes down. °4. You may drive when you are no longer taking prescription pain medication, you can comfortably wear a seatbelt, and you can safely maneuver your car and apply brakes. °5. You may have sexual intercourse when it is comfortable.  °9. FOLLOW UP in our office  °1. Please call [facility] at [phone] to set up an appointment to see your surgeon in the office for a follow-up appointment approximately 2-3 weeks after your surgery. °2. Make sure that you call for this appointment the day you arrive home to insure a convenient appointment time. °     10. IF YOU HAVE DISABILITY OR FAMILY LEAVE FORMS, BRING THEM TO THE               OFFICE FOR PROCESSING.  ° °WHEN TO CALL US [phone]:  °1. Poor pain control °2. Reactions / problems with new medications (rash/itching, nausea, etc)  °3. Fever over 101.5 F (38.5 C) °4. Inability to urinate °5. Nausea and/or vomiting °6. Worsening swelling or bruising °7. Continued bleeding from incision. °8. Increased pain, redness, or drainage from the incision ° °The clinic staff is available to answer your questions during regular business hours (8:30am-5pm). Please don’t hesitate to call and ask to speak to one of our nurses for clinical concerns.  °If you have a medical emergency, go to the nearest emergency room or call 911.  °A surgeon from [facility] is always on call at the hospitals  ° °[facility]  °[address], [address], [place], [place] [address] ?  °MAIN: [phone] ? TOLL FREE: [phone] ?  °FAX [phone]  °[email or web address] ° °

## 2014-07-12 NOTE — Progress Notes (Signed)
[facility] Progress Note  1 Day Post-Op  Subjective: Pt feels miserable.  The pain has moved from RUQ to epigastric and LUQ.  Mild nausea.  Dark urine, no BM .  Starting to get jaundice.   Objective: Vital signs in last 24 hours: Temp:  [97.9 F (36.6 C)-101.9 F (38.8 C)] 99.8 F (37.7 C) (11/25 0736) Pulse Rate:  [80-103] 92 (11/25 0453) Resp:  [18-33] 18 (11/25 0453) BP: (116-136)/(59-77) 131/70 mmHg (11/25 0453) SpO2:  [90 %-100 %] 93 % (11/25 0453) Last BM Date: 07/10/14  Intake/Output from previous day: 11/24 0701 - 11/25 0700 In: 2733.7 [P.O.:360; I.V.:2323.7; IV Piggyback:50] Out: 825 [Urine:825] Intake/Output this shift: Total I/O In: -  Out: 200 [Urine:200]  PE: Gen:  Alert, NAD, pleasant Card:  RRR, no M/G/R heard Pulm:  CTA, no W/R/R Abd: Soft, tender in epigastrium and LUQ, +BS, no HSM, incisions C/D/I Skin/eyes: mildly jaundice   Lab Results:   Recent Labs  07/10/14 2016 07/11/14 0459  WBC 14.8* 12.8*  HGB 14.1 13.3  HCT 42.1 39.7  PLT 253 211   BMET  Recent Labs  07/11/14 0459 07/12/14 0547  NA 134* 132*  K 3.9 4.7  CL 97 96  CO2 22 24  GLUCOSE 132* 120*  BUN 17 13  CREATININE 0.81 0.96  CALCIUM 8.6 8.6   PT/INR No results for input(s): LABPROT, INR in the last 72 hours. CMP     Component Value Date/Time   NA 132* 07/12/2014 0547   K 4.7 07/12/2014 0547   CL 96 07/12/2014 0547   CO2 24 07/12/2014 0547   GLUCOSE 120* 07/12/2014 0547   BUN 13 07/12/2014 0547   CREATININE 0.96 07/12/2014 0547   CALCIUM 8.6 07/12/2014 0547   PROT 6.7 07/12/2014 0547   ALBUMIN 3.2* 07/12/2014 0547   AST 121* 07/12/2014 0547   ALT 102* 07/12/2014 0547   ALKPHOS 72 07/12/2014 0547   BILITOT 6.7* 07/12/2014 0547   GFRNONAA 86* 07/12/2014 0547   GFRAA >90 07/12/2014 0547   Lipase     Component Value Date/Time   LIPASE 18 07/10/2014 2016       Studies/Results: US Abdomen Limited  07/10/2014   CLINICAL DATA:  Right  upper quadrant abdominal pain  EXAM: US ABDOMEN LIMITED - RIGHT UPPER QUADRANT  COMPARISON:  None.  FINDINGS: Gallbladder:  Sludge is layering within the gallbladder; no stone identified. The gallbladder is distended and there is mild wall thickening (5 mm) with striated appearance. There is no focal gallbladder tenderness, although the patient is reportedly medicated.  Common bile duct:  Diameter: 5 mm  Liver:  No focal lesion identified. Within normal limits in parenchymal echogenicity.  IMPRESSION: 1. Gallbladder wall thickening and mild edema, cannot exclude acute cholecystitis from occult stone. 2. Gallbladder sludge.   Electronically Signed   By: Jorje Guild M.D.   On: 07/10/2014 22:37    Anti-infectives: Anti-infectives    Start     Dose/Rate Route Frequency Ordered Stop   07/11/14 2200  cefTRIAXone (ROCEPHIN) 1 g in dextrose 5 % 50 mL IVPB     1 g100 mL/hr over 30 Minutes Intravenous Every 24 hours 07/11/14 0020     07/10/14 2330  cefTRIAXone (ROCEPHIN) 1 g in dextrose 5 % 50 mL IVPB     1 g100 mL/hr over 30 Minutes Intravenous  Once 07/10/14 2316 07/11/14 0026   07/10/14 2330  metroNIDAZOLE (FLAGYL) IVPB 500 mg  Status:  Discontinued     500  mg100 mL/hr over 60 Minutes Intravenous  Once 07/10/14 2316 07/11/14 0020      Assessment/Plan Acute cholecystitis POD #1 s/p lap chole WITHOUT IOC  Now with Biliary obstruction Jaundice Hyperbilirubinemia  Plan: 1.  IVF, pain control, antiemetics, continue antibiotics (rocephin) 2.  Urgent GI consult for ERCP to remove stones 3.  NPO until we decide timing of procedure 4.  Ambulate and IS 5.  SCD's and lovenox (last given at 1900)    LOS: 2 days    Alex Atkinson, Alex Atkinson 07/12/2014, 8:52 AM Pager: [phone]

## 2014-07-13 LAB — COMPREHENSIVE METABOLIC PANEL
ALK PHOS: 62 U/L (ref 39–117)
ALT: 73 U/L — ABNORMAL HIGH (ref 0–53)
AST: 73 U/L — ABNORMAL HIGH (ref 0–37)
Albumin: 2.8 g/dL — ABNORMAL LOW (ref 3.5–5.2)
Anion gap: 10 (ref 5–15)
BILIRUBIN TOTAL: 5.3 mg/dL — AB (ref 0.3–1.2)
BUN: 14 mg/dL (ref 6–23)
CO2: 25 meq/L (ref 19–32)
CREATININE: 0.99 mg/dL (ref 0.50–1.35)
Calcium: 8.5 mg/dL (ref 8.4–10.5)
Chloride: 101 mEq/L (ref 96–112)
GFR calc Af Amer: >90 mL/min (ref >90)
GFR, EST NON AFRICAN AMERICAN: 85 mL/min — AB (ref >90)
Glucose, Bld: 146 mg/dL — ABNORMAL HIGH (ref 70–99)
POTASSIUM: 4 meq/L (ref 3.7–5.3)
Sodium: 136 mEq/L — ABNORMAL LOW (ref 137–147)
Total Protein: 6.2 g/dL (ref 6.0–8.3)

## 2014-07-13 LAB — CBC
HEMATOCRIT: 35.2 % — AB (ref 39.0–52.0)
Hemoglobin: 11.6 g/dL — ABNORMAL LOW (ref 13.0–17.0)
MCH: 29.9 pg (ref 26.0–34.0)
MCHC: 33 g/dL (ref 30.0–36.0)
MCV: 90.7 fL (ref 78.0–100.0)
PLATELETS: 146 10*3/uL — AB (ref 150–400)
RBC: 3.88 MIL/uL — ABNORMAL LOW (ref 4.22–5.81)
RDW: 12.7 % (ref 11.5–15.5)
WBC: 8.3 10*3/uL (ref 4.0–10.5)

## 2014-07-13 MED ORDER — PANTOPRAZOLE SODIUM 40 MG PO TBEC
40.0000 mg | DELAYED_RELEASE_TABLET | Freq: Every day | ORAL | Status: DC
  Administered 2014-07-13 – 2014-07-14 (×2): 40 mg via ORAL
  Filled 2014-07-13 (×2): qty 1

## 2014-07-13 NOTE — Progress Notes (Signed)
Patient ID: Alex Atkinson, male   DOB: 11-19-1950, 63 y.o.   MRN: [id number] [facility] Progress Note:   1 Day Post-Op  Subjective: Mental status is clear.  Not walking much.  Trying to feel better Objective: Vital signs in last 24 hours: Temp:  [98.1 F (36.7 C)-98.8 F (37.1 C)] 98.4 F (36.9 C) (11/26 0547) Pulse Rate:  [74-101] 74 (11/26 0547) Resp:  [18-25] 18 (11/26 0547) BP: (109-142)/(48-81) 125/73 mmHg (11/26 0547) SpO2:  [91 %-98 %] 93 % (11/26 0547)  Intake/Output from previous day: 11/25 0701 - 11/26 0700 In: 4272 [P.O.:480; I.V.:3542; IV Piggyback:250] Out: 2900 [Urine:2900] Intake/Output this shift:    Physical Exam: Work of breathing is not labored.  Minimal pain  Lab Results:  Results for orders placed or performed during the hospital encounter of 07/10/14 (from the past 48 hour(s))  Comprehensive metabolic panel     Status: Abnormal   Collection Time: 07/12/14  5:47 AM  Result Value Ref Range   Sodium 132 (L) 137 - 147 mEq/L   Potassium 4.7 3.7 - 5.3 mEq/L    Comment: DELTA CHECK NOTED   Chloride 96 96 - 112 mEq/L   CO2 24 19 - 32 mEq/L   Glucose, Bld 120 (H) 70 - 99 mg/dL   BUN 13 6 - 23 mg/dL   Creatinine, Ser 0.96 0.50 - 1.35 mg/dL   Calcium 8.6 8.4 - 10.5 mg/dL   Total Protein 6.7 6.0 - 8.3 g/dL   Albumin 3.2 (L) 3.5 - 5.2 g/dL   AST 121 (H) 0 - 37 U/L   ALT 102 (H) 0 - 53 U/L   Alkaline Phosphatase 72 39 - 117 U/L   Total Bilirubin 6.7 (H) 0.3 - 1.2 mg/dL    Comment: DELTA CHECK NOTED VERIFIED WITH RN    GFR calc non Af Amer 86 (L) >90 mL/min   GFR calc Af Amer >90 >90 mL/min    Comment: (NOTE) The eGFR has been calculated using the CKD EPI equation. This calculation has not been validated in all clinical situations. eGFR's persistently <90 mL/min signify possible Chronic Kidney Disease.    Anion gap 12 5 - 15  Comprehensive metabolic panel     Status: Abnormal   Collection Time: 07/13/14  4:10 AM  Result Value Ref Range    Sodium 136 (L) 137 - 147 mEq/L   Potassium 4.0 3.7 - 5.3 mEq/L   Chloride 101 96 - 112 mEq/L   CO2 25 19 - 32 mEq/L   Glucose, Bld 146 (H) 70 - 99 mg/dL   BUN 14 6 - 23 mg/dL   Creatinine, Ser 0.99 0.50 - 1.35 mg/dL   Calcium 8.5 8.4 - 10.5 mg/dL   Total Protein 6.2 6.0 - 8.3 g/dL   Albumin 2.8 (L) 3.5 - 5.2 g/dL   AST 73 (H) 0 - 37 U/L   ALT 73 (H) 0 - 53 U/L   Alkaline Phosphatase 62 39 - 117 U/L   Total Bilirubin 5.3 (H) 0.3 - 1.2 mg/dL   GFR calc non Af Amer 85 (L) >90 mL/min   GFR calc Af Amer >90 >90 mL/min    Comment: (NOTE) The eGFR has been calculated using the CKD EPI equation. This calculation has not been validated in all clinical situations. eGFR's persistently <90 mL/min signify possible Chronic Kidney Disease.    Anion gap 10 5 - 15  CBC     Status: Abnormal   Collection Time: 07/13/14  4:10  AM  Result Value Ref Range   WBC 8.3 4.0 - 10.5 K/uL   RBC 3.88 (L) 4.22 - 5.81 MIL/uL   Hemoglobin 11.6 (L) 13.0 - 17.0 g/dL   HCT 35.2 (L) 39.0 - 52.0 %   MCV 90.7 78.0 - 100.0 fL   MCH 29.9 26.0 - 34.0 pg   MCHC 33.0 30.0 - 36.0 g/dL   RDW 12.7 11.5 - 15.5 %   Platelets 146 (L) 150 - 400 K/uL    Comment: REPEATED TO VERIFY    Radiology/Results: Dg Ercp Biliary & Pancreatic Ducts  07/12/2014   CLINICAL DATA:  Obstructive jaundice, choledocholithiasis  EXAM: INTRAOPERATIVE CHOLANGIOGRAM  TECHNIQUE: Cholangiographic images from the C-arm fluoroscopic device were submitted for interpretation post-operatively. Please see the procedural report for the amount of contrast and the fluoroscopy time utilized.  COMPARISON:  07/10/2014  FINDINGS: Spot fluoroscopic views during the ERCP demonstrate catheter and guidewire access in the common bile duct. Contrast injection performed. Mild diffuse biliary dilatation. Balloon sweep performed for stone extraction as well as sphincterotomy.  IMPRESSION: Limited imaging during ERCP with sphincterotomy and balloon sweep for  choledocholithiasis.   Electronically Signed   By: Daryll Brod M.D.   On: 07/12/2014 14:23    Anti-infectives: Anti-infectives    Start     Dose/Rate Route Frequency Ordered Stop   07/11/14 2200  cefTRIAXone (ROCEPHIN) 1 g in dextrose 5 % 50 mL IVPB     1 g100 mL/hr over 30 Minutes Intravenous Every 24 hours 07/11/14 0020     07/10/14 2330  cefTRIAXone (ROCEPHIN) 1 g in dextrose 5 % 50 mL IVPB     1 g100 mL/hr over 30 Minutes Intravenous  Once 07/10/14 2316 07/11/14 0026   07/10/14 2330  metroNIDAZOLE (FLAGYL) IVPB 500 mg  Status:  Discontinued     500 mg100 mL/hr over 60 Minutes Intravenous  Once 07/10/14 2316 07/11/14 0020      Assessment/Plan: Problem List: Patient Active Problem List   Diagnosis Date Noted  . Abnormal LFTs 07/12/2014  . Gallbladder & bile duct stone, acute cholecystitis and obstruction 07/12/2014  . Acute cholangitis 07/12/2014  . Acute cholecystitis 07/11/2014  . Cholecystitis 07/10/2014  . Testosterone deficiency 07/23/2012  . EKG abnormalities 07/23/2012  . Hyperlipidemia 05/09/2011    Not ready for discharge today.  Will advance diet and hopeful discharge tomorrow 1 Day Post-Op    LOS: 3 days   Matt B. Hassell Done, MD, [facility] [phone] beeper [phone]  07/13/2014 8:11 AM

## 2014-07-13 NOTE — Progress Notes (Signed)
Progress Note for [name]  Subjective: Feeling much better.    Objective: Vital signs in last 24 hours: Temp:  [98.1 F (36.7 C)-98.8 F (37.1 C)] 98.4 F (36.9 C) (11/26 0547) Pulse Rate:  [74-101] 74 (11/26 0547) Resp:  [18-25] 18 (11/26 0547) BP: (109-142)/(48-81) 125/73 mmHg (11/26 0547) SpO2:  [91 %-98 %] 93 % (11/26 0547) Last BM Date: 07/10/14  Intake/Output from previous day: 11/25 0701 - 11/26 0700 In: 4272 [P.O.:480; I.V.:3542; IV Piggyback:250] Out: 2900 [id number] Intake/Output this shift:    General appearance: alert and no distress GI: tender at the incision sites  Lab Results:  Recent Labs  07/10/14 2016 07/11/14 0459 07/13/14 0410  WBC 14.8* 12.8* 8.3  HGB 14.1 13.3 11.6*  HCT 42.1 39.7 35.2*  PLT 253 211 146*   BMET  Recent Labs  07/11/14 0459 07/12/14 0547 07/13/14 0410  NA 134* 132* 136*  K 3.9 4.7 4.0  CL 97 96 101  CO2 22 24 25   GLUCOSE 132* 120* 146*  BUN 17 13 14   CREATININE 0.81 0.96 0.99  CALCIUM 8.6 8.6 8.5   LFT  Recent Labs  07/13/14 0410  PROT 6.2  ALBUMIN 2.8*  AST 73*  ALT 73*  ALKPHOS 62  BILITOT 5.3*   PT/INR No results for input(s): LABPROT, INR in the last 72 hours. Hepatitis Panel No results for input(s): HEPBSAG, HCVAB, HEPAIGM, HEPBIGM in the last 72 hours. C-Diff No results for input(s): CDIFFTOX in the last 72 hours. Fecal Lactopherrin No results for input(s): FECLLACTOFRN in the last 72 hours.  Studies/Results: Dg Ercp Biliary & Pancreatic Ducts  07/12/2014   CLINICAL DATA:  Obstructive jaundice, choledocholithiasis  EXAM: INTRAOPERATIVE CHOLANGIOGRAM  TECHNIQUE: Cholangiographic images from the C-arm fluoroscopic device were submitted for interpretation post-operatively. Please see the procedural report for the amount of contrast and the fluoroscopy time utilized.  COMPARISON:  07/10/2014  FINDINGS: Spot fluoroscopic views during the ERCP demonstrate catheter and guidewire access in the  common bile duct. Contrast injection performed. Mild diffuse biliary dilatation. Balloon sweep performed for stone extraction as well as sphincterotomy.  IMPRESSION: Limited imaging during ERCP with sphincterotomy and balloon sweep for choledocholithiasis.   Electronically Signed   By: Daryll Brod M.D.   On: 07/12/2014 14:23    Medications:  Scheduled: . cefTRIAXone (ROCEPHIN)  IV  1 g Intravenous Q24H  . enoxaparin (LOVENOX) injection  40 mg Subcutaneous Q24H  . pantoprazole (PROTONIX) IV  40 mg Intravenous QHS   Continuous: . dextrose 5 % and 0.45 % NaCl with KCl 20 mEq/L 100 mL/hr at 07/13/14 0407    Assessment/Plan: 1) Choledocholithiasis s/p ERCP with stone extraction.   He is feeling well and the liver enzymes have improved.  No further GI intervention.  Plan: 1) Routine post-operative care. 2) Signing off.   LOS: 3 days   Alex Atkinson D 07/13/2014, 9:49 AM

## 2014-07-14 DIAGNOSIS — Z9049 Acquired absence of other specified parts of digestive tract: Secondary | ICD-10-CM

## 2014-07-14 LAB — COMPREHENSIVE METABOLIC PANEL
ALBUMIN: 3.1 g/dL — AB (ref 3.5–5.2)
ALT: 70 U/L — ABNORMAL HIGH (ref 0–53)
AST: 58 U/L — AB (ref 0–37)
Alkaline Phosphatase: 73 U/L (ref 39–117)
Anion gap: 12 (ref 5–15)
BILIRUBIN TOTAL: 4.1 mg/dL — AB (ref 0.3–1.2)
BUN: 11 mg/dL (ref 6–23)
CHLORIDE: 102 meq/L (ref 96–112)
CO2: 25 mEq/L (ref 19–32)
Calcium: 8.7 mg/dL (ref 8.4–10.5)
Creatinine, Ser: 0.88 mg/dL (ref 0.50–1.35)
GFR calc Af Amer: >90 mL/min (ref >90)
GFR calc non Af Amer: 90 mL/min — ABNORMAL LOW (ref >90)
Glucose, Bld: 114 mg/dL — ABNORMAL HIGH (ref 70–99)
Potassium: 4.2 mEq/L (ref 3.7–5.3)
Sodium: 139 mEq/L (ref 137–147)
Total Protein: 7.3 g/dL (ref 6.0–8.3)

## 2014-07-14 NOTE — Discharge Summary (Signed)
Physician Discharge Summary  Patient ID: Alex Atkinson MRN: [id number] DOB/AGE: 12-21-1950 63 y.o.  Admit date: 07/10/2014 Discharge date: 07/14/2014  Admission Diagnoses:  Acute cholecystitis  Discharge Diagnoses:  Same with CBD stones  Active Problems:   Cholecystitis   Acute cholecystitis   Abnormal LFTs   Gallbladder & bile duct stone, acute cholecystitis and obstruction   Acute cholangitis   S/P laparoscopic cholecystectomy + ERCP Nov 2015   Surgery:  Lap chole by Dr. Rosendo Gros and ERCP by Dr. Deatra Ina  Discharged Condition: improved   Hospital Course:   Had surgery and ERCP.  T. Bili gradually decreased and was 4.1 at the time of discharge.    Consults: Deatra Ina  Significant Diagnostic Studies: Labs and IOC    Discharge Exam: Blood pressure 128/80, pulse 72, temperature 98.4 F (36.9 C), temperature source Oral, resp. rate 15, height 6' (1.829 m), weight 268 lb (121.564 kg), SpO2 98 %. Incisions covered and OK.    Disposition: 04-Intermediate Care Facility  Discharge Instructions    Diet - low sodium heart healthy    Complete by:  As directed      Discharge instructions    Complete by:  As directed   May shower and remove dressings Advance diet as tolerated Tylenol for pain     Increase activity slowly    Complete by:  As directed      No wound care    Complete by:  As directed             Medication List    TAKE these medications        albuterol 108 (90 BASE) MCG/ACT inhaler  Commonly known as:  PROVENTIL HFA;VENTOLIN HFA  Inhale 2 puffs into the lungs every 6 (six) hours as needed for wheezing or shortness of breath.     calcium carbonate 500 MG chewable tablet  Commonly known as:  TUMS - dosed in mg elemental calcium  Chew 1 tablet by mouth daily.     famotidine 20 MG tablet  Commonly known as:  PEPCID  Take 20 mg by mouth as needed for heartburn or indigestion.     HYDROcodone-acetaminophen 5-325 MG per tablet  Commonly known as:   NORCO/VICODIN  Take 1-2 tablets by mouth every 6 (six) hours as needed for moderate pain or severe pain.     ibuprofen 400 MG tablet  Commonly known as:  ADVIL,MOTRIN  Take 400 mg by mouth as needed.           Follow-up Information    Follow up with [facility]. Schedule an appointment as soon as possible for a visit in 1 week.   Contact information:   [address] [place]   [place] [address] [phone]       Signed: Pedro Earls 07/14/2014, 8:50 AM

## 2014-07-17 ENCOUNTER — Other Ambulatory Visit (INDEPENDENT_AMBULATORY_CARE_PROVIDER_SITE_OTHER): Payer: Self-pay

## 2014-07-17 ENCOUNTER — Encounter (HOSPITAL_COMMUNITY): Payer: Self-pay | Admitting: Gastroenterology

## 2014-07-17 DIAGNOSIS — Z9049 Acquired absence of other specified parts of digestive tract: Secondary | ICD-10-CM

## 2014-08-04 ENCOUNTER — Encounter: Payer: BC Managed Care – PPO | Admitting: Gastroenterology

## 2014-08-25 ENCOUNTER — Ambulatory Visit (AMBULATORY_SURGERY_CENTER): Payer: BC Managed Care – PPO | Admitting: *Deleted

## 2014-08-25 VITALS — Ht 72.0 in | Wt 266.0 lb

## 2014-08-25 DIAGNOSIS — Z1211 Encounter for screening for malignant neoplasm of colon: Secondary | ICD-10-CM

## 2014-08-25 MED ORDER — MOVIPREP 100 G PO SOLR: ORAL | Status: DC

## 2014-08-25 NOTE — Progress Notes (Signed)
Patient denies any allergies to eggs or soy. Patient denies any problems with anesthesia/sedation. Patient denies any oxygen use at home and does not take any diet/weight loss medications. EMMI education assisgned to patient on colonoscopy, this was explained and instructions given to patient. 

## 2014-09-06 DIAGNOSIS — K649 Unspecified hemorrhoids: Secondary | ICD-10-CM

## 2014-09-06 HISTORY — DX: Unspecified hemorrhoids: K64.9

## 2014-09-08 ENCOUNTER — Encounter: Payer: BC Managed Care – PPO | Admitting: Gastroenterology

## 2014-09-22 ENCOUNTER — Encounter: Payer: Self-pay | Admitting: Gastroenterology

## 2014-09-22 ENCOUNTER — Ambulatory Visit (AMBULATORY_SURGERY_CENTER): Payer: BLUE CROSS/BLUE SHIELD | Admitting: Gastroenterology

## 2014-09-22 VITALS — BP 136/65 | HR 55 | Temp 97.2°F | Resp 12 | Ht 72.0 in | Wt 266.0 lb

## 2014-09-22 DIAGNOSIS — Z1211 Encounter for screening for malignant neoplasm of colon: Secondary | ICD-10-CM

## 2014-09-22 MED ORDER — SODIUM CHLORIDE 0.9 % IV SOLN: 500.0000 mL | INTRAVENOUS | Status: DC

## 2014-09-22 NOTE — Progress Notes (Signed)
Stable to RR 

## 2014-09-22 NOTE — Patient Instructions (Signed)
YOU HAD AN ENDOSCOPIC PROCEDURE TODAY AT THE [facility]: Refer to the procedure report that was given to you for any specific questions about what was found during the examination.  If the procedure report does not answer your questions, please call your gastroenterologist to clarify.  If you requested that your care partner not be given the details of your procedure findings, then the procedure report has been included in a sealed envelope for you to review at your convenience later.  YOU SHOULD EXPECT: Some feelings of bloating in the abdomen. Passage of more gas than usual.  Walking can help get rid of the air that was put into your GI tract during the procedure and reduce the bloating. If you had a lower endoscopy (such as a colonoscopy or flexible sigmoidoscopy) you may notice spotting of blood in your stool or on the toilet paper. If you underwent a bowel prep for your procedure, then you may not have a normal bowel movement for a few days.  DIET: Your first meal following the procedure should be a light meal and then it is ok to progress to your normal diet.  A half-sandwich or bowl of soup is an example of a good first meal.  Heavy or fried foods are harder to digest and may make you feel nauseous or bloated.  Likewise meals heavy in dairy and vegetables can cause extra gas to form and this can also increase the bloating.  Drink plenty of fluids but you should avoid alcoholic beverages for 24 hours.  ACTIVITY: Your care partner should take you home directly after the procedure.  You should plan to take it easy, moving slowly for the rest of the day.  You can resume normal activity the day after the procedure however you should NOT DRIVE or use heavy machinery for 24 hours (because of the sedation medicines used during the test).    SYMPTOMS TO REPORT IMMEDIATELY: A gastroenterologist can be reached at any hour.  During normal business hours, 8:30 AM to 5:00 PM Monday through Friday,  call [phone].  After hours and on weekends, please call the GI answering service at [phone] who will take a message and have the physician on call contact you.   Following lower endoscopy (colonoscopy or flexible sigmoidoscopy):  Excessive amounts of blood in the stool  Significant tenderness or worsening of abdominal pains  Swelling of the abdomen that is new, acute  Fever of 100F or higher FOLLOW UP: If any biopsies were taken you will be contacted by phone or by letter within the next 1-3 weeks.  Call your gastroenterologist if you have not heard about the biopsies in 3 weeks.  Our staff will call the home number listed on your records the next business day following your procedure to check on you and address any questions or concerns that you may have at that time regarding the information given to you following your procedure. This is a courtesy call and so if there is no answer at the home number and we have not heard from you through the emergency physician on call, we will assume that you have returned to your regular daily activities without incident.  SIGNATURES/CONFIDENTIALITY: You and/or your care partner have signed paperwork which will be entered into your electronic medical record.  These signatures attest to the fact that that the information above on your After Visit Summary has been reviewed and is understood.  Full responsibility of the confidentiality of this discharge   information lies with you and/or your care-partner.     Handouts were given to your care partner on hemorrhoids, diverticulosis,and a high fiber diet with liberal fluid intake. You might notice some irritation in your nose or drainage.  This may cause feelings of congestion.  This is from the oxygen, which can be drying.  This is no cause for concern; this should clear up in a few days.  You may resume your current medications today. Please call if any questions or concerns.

## 2014-09-22 NOTE — Op Note (Signed)
[place]  [address] [place], [id number]   COLONOSCOPY PROCEDURE REPORT  PATIENT: Alex Atkinson, Alex Atkinson  MR#: [id number] BIRTHDATE: 09/08/1950 , 63  yrs. old GENDER: male ENDOSCOPIST: Ladene Artist, MD, [facility] PROCEDURE DATE:  09/22/2014 PROCEDURE:   Colonoscopy, screening First Screening Colonoscopy - Avg.  risk and is 50 yrs.  old or older - No.  Prior Negative Screening - Now for repeat screening. 10 or more years since last screening  History of Adenoma - Now for follow-up colonoscopy & has been > or = to 3 yrs.  N/A  Polyps Removed Today? No.  Polyps Removed Today? No.  Recommend repeat exam, <10 yrs? Polyps Removed Today? No.  Recommend repeat exam, <10 yrs? No. ASA CLASS:   Class II INDICATIONS:average risk for colorectal cancer. MEDICATIONS: Monitored anesthesia care, Propofol 300 mg IV, and lidocaine 40 mg IV DESCRIPTION OF PROCEDURE:   After the risks benefits and alternatives of the procedure were thoroughly explained, informed consent was obtained.  The digital rectal exam revealed no abnormalities of the rectum.   The LB [id number]  endoscope was introduced through the anus and advanced to the cecum, which was identified by both the appendix and ileocecal valve. No adverse events experienced.   The quality of the prep was good, using MoviPrep  The instrument was then slowly withdrawn as the colon was fully examined.    COLON FINDINGS: There was mild diverticulosis noted in the sigmoid colon.   The examination was otherwise normal.  Retroflexed views revealed internal Grade I hemorrhoids. The time to cecum=2 minutes 35 seconds.  Withdrawal time=11 minutes 48 seconds.  The scope was withdrawn and the procedure completed. COMPLICATIONS: There were no immediate complications.  ENDOSCOPIC IMPRESSION: 1.   Mild diverticulosis in the sigmoid colon 2.   Grade I internal hemorrhoids  RECOMMENDATIONS: 1.  High fiber diet with liberal fluid  intake. 2.  You should continue to follow colorectal cancer screening guidelines for "routine risk" patients with a repeat colonoscopy in 10 years.  There is no need for routine, screening FOBT (stool) testing for at least 5 years.  eSigned:  Ladene Artist, MD, [facility] 09/22/2014 9:29 AM   cc: Gaynelle Arabian, MD

## 2014-09-22 NOTE — Progress Notes (Signed)
No problems noted in the recovery room. maw 

## 2014-09-25 ENCOUNTER — Telehealth: Payer: Self-pay | Admitting: *Deleted

## 2014-09-25 NOTE — Telephone Encounter (Signed)
  Follow up Call-  Call back number 09/22/2014  Post procedure Call Back phone  # [phone]  Permission to leave phone message Yes     Patient questions:  Do you have a fever, pain , or abdominal swelling? No. Pain Score  0 *  Have you tolerated food without any problems? Yes.    Have you been able to return to your normal activities? Yes.    Do you have any questions about your discharge instructions: Diet   No. Medications  No. Follow up visit  No.  Do you have questions or concerns about your Care? No.  Actions: * If pain score is 4 or above: No action needed, pain <4.

## 2014-09-29 ENCOUNTER — Encounter: Payer: Self-pay | Admitting: Gastroenterology

## 2016-06-22 DIAGNOSIS — Z23 Encounter for immunization: Secondary | ICD-10-CM | POA: Diagnosis not present

## 2016-08-18 HISTORY — PX: OTHER SURGICAL HISTORY: SHX169

## 2016-10-03 DIAGNOSIS — H2513 Age-related nuclear cataract, bilateral: Secondary | ICD-10-CM | POA: Diagnosis not present

## 2017-02-26 DIAGNOSIS — Z136 Encounter for screening for cardiovascular disorders: Secondary | ICD-10-CM | POA: Diagnosis not present

## 2017-02-26 DIAGNOSIS — R05 Cough: Secondary | ICD-10-CM | POA: Diagnosis not present

## 2017-02-26 DIAGNOSIS — Z23 Encounter for immunization: Secondary | ICD-10-CM | POA: Diagnosis not present

## 2017-02-26 DIAGNOSIS — Z0000 Encounter for general adult medical examination without abnormal findings: Secondary | ICD-10-CM | POA: Diagnosis not present

## 2017-02-26 DIAGNOSIS — Z131 Encounter for screening for diabetes mellitus: Secondary | ICD-10-CM | POA: Diagnosis not present

## 2017-04-02 ENCOUNTER — Ambulatory Visit
Admission: RE | Admit: 2017-04-02 | Discharge: 2017-04-02 | Disposition: A | Discharge status: Home / Self Care | Payer: Medicare Other | Source: Ambulatory Visit | Attending: Family Medicine | Admitting: Family Medicine

## 2017-04-02 ENCOUNTER — Other Ambulatory Visit: Payer: Self-pay | Admitting: Family Medicine

## 2017-04-02 DIAGNOSIS — R059 Cough, unspecified: Secondary | ICD-10-CM

## 2017-04-02 DIAGNOSIS — R05 Cough: Secondary | ICD-10-CM | POA: Diagnosis not present

## 2017-04-02 DIAGNOSIS — J189 Pneumonia, unspecified organism: Secondary | ICD-10-CM | POA: Diagnosis not present

## 2017-04-16 DIAGNOSIS — J189 Pneumonia, unspecified organism: Secondary | ICD-10-CM | POA: Diagnosis not present

## 2017-04-16 DIAGNOSIS — R05 Cough: Secondary | ICD-10-CM | POA: Diagnosis not present

## 2017-04-24 ENCOUNTER — Emergency Department (HOSPITAL_COMMUNITY): Payer: Medicare Other

## 2017-04-24 ENCOUNTER — Encounter (HOSPITAL_COMMUNITY): Payer: Self-pay | Admitting: *Deleted

## 2017-04-24 ENCOUNTER — Emergency Department (HOSPITAL_COMMUNITY)
Admission: EM | Admit: 2017-04-24 | Discharge: 2017-04-24 | Disposition: A | Discharge status: Home / Self Care | Payer: Medicare Other | Attending: Emergency Medicine | Admitting: Emergency Medicine

## 2017-04-24 DIAGNOSIS — R531 Weakness: Secondary | ICD-10-CM | POA: Diagnosis not present

## 2017-04-24 DIAGNOSIS — Z79899 Other long term (current) drug therapy: Secondary | ICD-10-CM | POA: Diagnosis not present

## 2017-04-24 DIAGNOSIS — M791 Myalgia, unspecified site: Secondary | ICD-10-CM

## 2017-04-24 DIAGNOSIS — R700 Elevated erythrocyte sedimentation rate: Secondary | ICD-10-CM | POA: Insufficient documentation

## 2017-04-24 DIAGNOSIS — J8410 Pulmonary fibrosis, unspecified: Secondary | ICD-10-CM | POA: Diagnosis not present

## 2017-04-24 DIAGNOSIS — J189 Pneumonia, unspecified organism: Secondary | ICD-10-CM | POA: Diagnosis not present

## 2017-04-24 LAB — COMPREHENSIVE METABOLIC PANEL
ALBUMIN: 3.5 g/dL (ref 3.5–5.0)
ALT: 17 U/L (ref 17–63)
ANION GAP: 9 (ref 5–15)
AST: 20 U/L (ref 15–41)
Alkaline Phosphatase: 39 U/L (ref 38–126)
BILIRUBIN TOTAL: 1 mg/dL (ref 0.3–1.2)
BUN: 16 mg/dL (ref 6–20)
CALCIUM: 9 mg/dL (ref 8.9–10.3)
CHLORIDE: 105 mmol/L (ref 101–111)
CO2: 23 mmol/L (ref 22–32)
Creatinine, Ser: 0.88 mg/dL (ref 0.61–1.24)
GFR calc Af Amer: >60 mL/min (ref >60)
GFR calc non Af Amer: >60 mL/min (ref >60)
GLUCOSE: 105 mg/dL — AB (ref 65–99)
POTASSIUM: 4.5 mmol/L (ref 3.5–5.1)
SODIUM: 137 mmol/L (ref 135–145)
Total Protein: 7.4 g/dL (ref 6.5–8.1)

## 2017-04-24 LAB — I-STAT CG4 LACTIC ACID, ED
LACTIC ACID, VENOUS: 1.48 mmol/L (ref 0.5–1.9)
LACTIC ACID, VENOUS: 1.17 mmol/L (ref 0.5–1.9)

## 2017-04-24 LAB — CBC WITH DIFFERENTIAL/PLATELET
BASOS ABS: 0 10*3/uL (ref 0.0–0.1)
BASOS PCT: 0 %
EOS PCT: 5 %
Eosinophils Absolute: 0.5 10*3/uL (ref 0.0–0.7)
HCT: 40.4 % (ref 39.0–52.0)
Hemoglobin: 13.2 g/dL (ref 13.0–17.0)
LYMPHS PCT: 14 %
Lymphs Abs: 1.7 10*3/uL (ref 0.7–4.0)
MCH: 29.5 pg (ref 26.0–34.0)
MCHC: 32.7 g/dL (ref 30.0–36.0)
MCV: 90.4 fL (ref 78.0–100.0)
Monocytes Absolute: 0.8 10*3/uL (ref 0.1–1.0)
Monocytes Relative: 6 %
Neutro Abs: 9.1 10*3/uL — ABNORMAL HIGH (ref 1.7–7.7)
Neutrophils Relative %: 75 %
PLATELETS: 290 10*3/uL (ref 150–400)
RBC: 4.47 MIL/uL (ref 4.22–5.81)
RDW: 12.5 % (ref 11.5–15.5)
WBC: 12.1 10*3/uL — AB (ref 4.0–10.5)

## 2017-04-24 LAB — C-REACTIVE PROTEIN: CRP: 5.1 mg/dL — AB (ref <1.0)

## 2017-04-24 LAB — SEDIMENTATION RATE: SED RATE: 57 mm/h — AB (ref 0–16)

## 2017-04-24 LAB — CK: CK TOTAL: 67 U/L (ref 49–397)

## 2017-04-24 MED ORDER — AEROCHAMBER PLUS FLO-VU SMALL MISC
1.0000 | Freq: Once | Status: DC
  Filled 2017-04-24: qty 1

## 2017-04-24 MED ORDER — MELOXICAM 15 MG PO TABS: 15.0000 mg | ORAL_TABLET | Freq: Every day | ORAL | 0 refills | Status: DC

## 2017-04-24 MED ORDER — KETOROLAC TROMETHAMINE 30 MG/ML IJ SOLN
30.0000 mg | Freq: Once | INTRAMUSCULAR | Status: AC
  Administered 2017-04-24: 30 mg via INTRAVENOUS
  Filled 2017-04-24: qty 1

## 2017-04-24 MED ORDER — BENZONATATE 100 MG PO CAPS: 100.0000 mg | ORAL_CAPSULE | Freq: Three times a day (TID) | ORAL | 0 refills | Status: DC

## 2017-04-24 MED ORDER — ALBUTEROL SULFATE HFA 108 (90 BASE) MCG/ACT IN AERS
2.0000 | INHALATION_SPRAY | RESPIRATORY_TRACT | Status: DC | PRN
  Administered 2017-04-24: 2 via RESPIRATORY_TRACT
  Filled 2017-04-24: qty 6.7

## 2017-04-24 NOTE — Discharge Instructions (Signed)
Please read and follow all provided instructions.  Your diagnoses today include:  1. Pulmonary fibrosis, unspecified ([place])   2. Myalgia   3. Elevated sed rate     Tests performed today include:  Inflammatory markers (Sed rate and CRP) - are elevated  Blood counts and electrolytes - slightly high white blood cells, otherwise normal  Chest CT - shows areas of scar tissue in the lungs, will need continued evaluation by a lung specialist  CK - test for muscle breakdown is normal  Vital signs. See below for your results today.   Medications prescribed:   Meloxicam - anti-inflammatory pain medication  You have been prescribed an anti-inflammatory medication or NSAID. Take with food and take an acid-blocking medicine for your stomach daily. Do not take aspirin, ibuprofen, or naproxen if taking this medication. Take smallest effective dose for the shortest duration needed for your pain. Stop taking if you experience stomach pain or vomiting.    Albuterol inhaler - medication that opens up your airway  Use inhaler as follows: 1-2 puffs with spacer every 4 hours as needed for wheezing, cough, or shortness of breath.    Tessalon Perles - cough suppressant medication  Take any prescribed medications only as directed.  Home care instructions:  Follow any educational materials contained in this packet.  BE VERY CAREFUL not to take multiple medicines containing Tylenol (also called acetaminophen). Doing so can lead to an overdose which can damage your liver and cause liver failure and possibly death.   Follow-up instructions: Please follow-up with your primary care provider and the lung doctor within 1 week.   Return instructions:   Please return to the Emergency Department if you experience worsening symptoms.   Return with fever, chest pain, shortness of breath  Please return if you have any other emergent concerns.  Additional Information:  Your vital signs today were: BP  126/79    Pulse 66    Temp 98.5 F (36.9 C) (Oral)    Resp 18    Ht 6' (1.829 m)    Wt 115.7 kg (255 lb)    SpO2 100%    BMI 34.58 kg/m  If your blood pressure (BP) was elevated above 135/85 this visit, please have this repeated by your doctor within one month. --------------

## 2017-04-24 NOTE — ED Notes (Signed)
Patient ambulated to the restroom

## 2017-04-24 NOTE — ED Notes (Signed)
Pat at bedside

## 2017-04-24 NOTE — ED Notes (Signed)
Patient transported to CT 

## 2017-04-24 NOTE — ED Provider Notes (Signed)
[facility] DEPT Provider Note   CSN: [id number] Arrival date & time: 04/24/17  0806     History   Chief Complaint Chief Complaint  Patient presents with  . Pain    HPI Alex Atkinson is a 66 y.o. male.  Patient with history of mononucleosis, cholecystectomy -- presents with complaint of one month history of generalized joint and muscle pains as well as a chronic cough for approximately one year. Patient reports going camping in early August. After returning, patient developed generalized aches. He was seen by his doctor who ordered an x-ray which showed pneumonia. He was treated with azithromycin and Augmentin simultaneously. This seemed to improve symptoms however muscle pains returned. Patient has also been taking Symbicort. Cough has not improved. Joint pains have become progressively worse. No fevers or joint swelling noted. Patient was having difficulty standing up or getting out of bed today, prompting emergency department visit. Cough is nonproductive. No shortness of breath or abdominal pain. Change in bowel movements. No change in color of urine or other irritative urinary symptoms. Patient denies any rashes. He did find a tick on him in May. 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 He has been taking NSAID medications with temporary relief.      Past Medical History:  Diagnosis Date  . Diverticulosis of colon 2005   Dr Fuller Plan  . Hemorrhoid 09/06/14   developed ? hemorrhoid  . Mononucleosis 1973  . PVC's (premature ventricular contractions)    caffeine induced; evaluated by Dr Aldona Bar    Patient Active Problem List   Diagnosis Date Noted  . S/P laparoscopic cholecystectomy + ERCP Nov 2015 07/14/2014  . Abnormal LFTs 07/12/2014  . Gallbladder & bile duct stone, acute cholecystitis and obstruction 07/12/2014  . Acute cholangitis 07/12/2014  . Acute cholecystitis 07/11/2014  . Cholecystitis 07/10/2014  . Testosterone deficiency 07/23/2012  .  EKG abnormalities 07/23/2012  . Hyperlipidemia 05/09/2011    Past Surgical History:  Procedure Laterality Date  . atrthroscopy knee  1983 & 2007   R  . CHOLECYSTECTOMY N/A 07/11/2014   Procedure: LAPAROSCOPIC CHOLECYSTECTOMY ;  Surgeon: Ralene Ok, MD;  Location: [facility];  Service: General;  Laterality: N/A;  . colonoscopy with polypectomy  2005   polypoid colonic mucosa; Dr Fuller Plan  . ENDOSCOPIC RETROGRADE CHOLANGIOPANCREATOGRAPHY (ERCP) WITH PROPOFOL N/A 07/12/2014   Procedure: ENDOSCOPIC RETROGRADE CHOLANGIOPANCREATOGRAPHY (ERCP) WITH PROPOFOL;  Surgeon: Inda Castle, MD;  Location: [facility];  Service: Endoscopy;  Laterality: N/A;  . KNEE ARTHROSCOPY  Junior Medications    Prior to Admission medications   Medication Sig Start Date End Date Taking? Authorizing Provider  calcium carbonate (TUMS - DOSED IN MG ELEMENTAL CALCIUM) 500 MG chewable tablet Chew 1 tablet by mouth daily.    [provider]  famotidine (PEPCID) 20 MG tablet Take 20 mg by mouth as needed for heartburn or indigestion.    [provider]  ibuprofen (ADVIL,MOTRIN) 400 MG tablet Take 400 mg by mouth as needed.      [provider]    Family History Family History  Problem Relation Age of Onset  . Lung cancer Father        cns metastases  . Dementia Mother   . Stroke Maternal Grandmother 87  . Leukemia Daughter        CML  . Diabetes Neg Hx   . Heart disease  Neg Hx   . Colon cancer Neg Hx     Social History Social History  Substance Use Topics  . Smoking status: Never Smoker  . Smokeless tobacco: Never Used  . Alcohol use 0.0 oz/week     Comment: weekends occasional per pt.     Allergies   Darvon   Review of Systems Review of Systems  Constitutional: Negative for fever.  HENT: Negative for rhinorrhea and sore throat.   Eyes: Negative for redness.  Respiratory: Positive for cough. Negative for  shortness of breath and wheezing.   Cardiovascular: Negative for chest pain.  Gastrointestinal: Negative for abdominal pain, diarrhea, nausea and vomiting.  Genitourinary: Negative for dysuria.  Musculoskeletal: Positive for arthralgias, gait problem and myalgias. Negative for joint swelling.  Skin: Negative for rash.  Neurological: Negative for headaches.     Physical Exam Updated Vital Signs BP 126/88 (BP Location: Right Arm)   Pulse 76   Temp 98.5 F (36.9 C) (Oral)   Resp 18   Ht 6' (1.829 m)   Wt 115.7 kg (255 lb)   SpO2 97%   BMI 34.58 kg/m   Physical Exam  Constitutional: He appears well-developed and well-nourished.  HENT:  Head: Normocephalic and atraumatic.  Mouth/Throat: Oropharynx is clear and moist.  Eyes: Conjunctivae are normal. Right eye exhibits no discharge. Left eye exhibits no discharge.  Neck: Normal range of motion. Neck supple.  Cardiovascular: Normal rate, regular rhythm and normal heart sounds.   Pulmonary/Chest: Effort normal. No respiratory distress. He has no wheezes. He has rales. He exhibits no tenderness.  Abdominal: Soft. There is no tenderness.  Musculoskeletal: Normal range of motion. He exhibits tenderness (generalized). He exhibits no edema.  Full ROM all joints. No large effusions or overlying erythema noted. Nothing present that looks like a joint effusion.   Neurological: He is alert.  Skin: Skin is warm and dry.  Psychiatric: He has a normal mood and affect.  Nursing note and vitals reviewed.    ED Treatments / Results  Labs (all labs ordered are listed, but only abnormal results are displayed) Labs Reviewed  COMPREHENSIVE METABOLIC PANEL - Abnormal; Notable for the following:       Result Value   Glucose, Bld 105 (*)    All other components within normal limits  CBC WITH DIFFERENTIAL/PLATELET - Abnormal; Notable for the following:    WBC 12.1 (*)    Neutro Abs 9.1 (*)    All other components within normal limits    C-REACTIVE PROTEIN - Abnormal; Notable for the following:    CRP 5.1 (*)    All other components within normal limits  SEDIMENTATION RATE - Abnormal; Notable for the following:    Sed Rate 57 (*)    All other components within normal limits  CK  B. BURGDORFI ANTIBODIES  I-STAT CG4 LACTIC ACID, ED  I-STAT CG4 LACTIC ACID, ED    EKG  EKG Interpretation None       Radiology Dg Chest 2 View  Result Date: 04/24/2017 CLINICAL DATA:  Generalized weakness. EXAM: CHEST  2 VIEW COMPARISON:  04/02/2017 FINDINGS: Diffuse interstitial prominence throughout the lungs, unchanged. No effusions. Heart is normal size. No acute bony abnormality. IMPRESSION: Stable diffuse interstitial prominence throughout the lungs. This could represent chronic interstitial lung disease. Cannot exclude superimposed acute interstitial pneumonia. Electronically Signed   By: Rolm Baptise M.D.   On: 04/24/2017 08:46    Procedures Procedures (including critical care time)  Medications Ordered in ED Medications  albuterol (PROVENTIL HFA;VENTOLIN HFA) 108 (90 Base) MCG/ACT inhaler 2 puff (2 puffs Inhalation Given 04/24/17 1331)  AEROCHAMBER PLUS FLO-VU SMALL device MISC 1 each (not administered)  ketorolac (TORADOL) 30 MG/ML injection 30 mg (30 mg Intravenous Given 04/24/17 1331)     Initial Impression / Assessment and Plan / ED Course  I have reviewed the triage vital signs and the nursing notes.  Pertinent labs & imaging results that were available during my care of the patient were reviewed by me and considered in my medical decision making (see chart for details).     Patient seen and examined. Work-up initiated. Discussed with Dr. Regenia Skeeter who has seen patient. Chest CT ordered.     Vital signs reviewed and are as follows: BP 126/88 (BP Location: Right Arm)   Pulse 76   Temp 98.5 F (36.9 C) (Oral)   Resp 18   Ht 6' (1.829 m)   Wt 115.7 kg (255 lb)   SpO2 97%   BMI 34.58 kg/m   Discuss results with  patient, wife, and son at bedside.  Regarding lung findings: will give referral to pulmonology. Will discharge to home with Tessalon and albuterol inhaler. Patient has Tussin cough syrup at home.  Regarding joint and muscle pain: Discussed that this is going to require more follow-up. Encouraged follow-up with PCP in the next week. We discussed limited utility of inflammatory markers. Will defer complete autoimmune workup to outpatient doctors. Patient will use meloxicam at home in conjunction with home famotidine. Dose of Toradol given prior to discharge.  Patient also made aware of coronary artery disease findings on CT. Lyme titre is pending.   Encouraged return to the emergency department with worsening shortness of breath, inability to walk, fevers, new symptoms or other concerns.  Final Clinical Impressions(s) / ED Diagnoses   Final diagnoses:  Pulmonary fibrosis, unspecified (HCC)  Myalgia  Elevated sed rate   Patient presents today with severe joint and muscle pain. Unclear etiology at this point. No muscle breakdown. Confirmatory markers are elevated. Patient is ambulatory but is very slow in moving. No indication for admission at this time.  Patient has had a chronic cough for over a year, worsening recently. This was further evaluated with chest CT given recent chest x-ray, pneumonia treatment, yet continued findings on chest x-ray. No hypoxia here. No concern for PE or coronary artery disease, CHF. Patient will need to follow-up with pulmonologist for further evaluation and treatment. Home with symptomatic control at this point.  New Prescriptions New Prescriptions   BENZONATATE (TESSALON) 100 MG CAPSULE    Take 1 capsule (100 mg total) by mouth every 8 (eight) hours.   MELOXICAM (MOBIC) 15 MG TABLET    Take 1 tablet (15 mg total) by mouth daily.     Carlisle Cater, PA-C 04/24/17 1342    Sherwood Gambler, MD 04/30/17 [id number]

## 2017-04-24 NOTE — ED Triage Notes (Addendum)
Pt reports generalized bodyaches since 8/8, went to pcp and was diagnosed with pneumonia. Pt having increase in body pain, difficulty moving around and still has cough and sore throat. Denies fever, denies n/v/d.

## 2017-04-25 LAB — B. BURGDORFI ANTIBODIES

## 2017-04-27 DIAGNOSIS — J84116 Cryptogenic organizing pneumonia: Secondary | ICD-10-CM | POA: Diagnosis not present

## 2017-04-27 DIAGNOSIS — J8410 Pulmonary fibrosis, unspecified: Secondary | ICD-10-CM | POA: Diagnosis not present

## 2017-04-27 DIAGNOSIS — M791 Myalgia: Secondary | ICD-10-CM | POA: Diagnosis not present

## 2017-04-30 ENCOUNTER — Ambulatory Visit (INDEPENDENT_AMBULATORY_CARE_PROVIDER_SITE_OTHER): Payer: Medicare Other | Admitting: Internal Medicine

## 2017-04-30 ENCOUNTER — Encounter: Payer: Self-pay | Admitting: Internal Medicine

## 2017-04-30 ENCOUNTER — Institutional Professional Consult (permissible substitution): Payer: Medicare Other | Admitting: Internal Medicine

## 2017-04-30 VITALS — BP 150/90 | HR 65 | Ht 72.0 in | Wt 259.0 lb

## 2017-04-30 DIAGNOSIS — R053 Chronic cough: Secondary | ICD-10-CM | POA: Insufficient documentation

## 2017-04-30 DIAGNOSIS — R05 Cough: Secondary | ICD-10-CM | POA: Diagnosis not present

## 2017-04-30 DIAGNOSIS — R918 Other nonspecific abnormal finding of lung field: Secondary | ICD-10-CM | POA: Diagnosis not present

## 2017-04-30 DIAGNOSIS — R058 Other specified cough: Secondary | ICD-10-CM

## 2017-04-30 MED ORDER — PREDNISONE 10 MG PO TABS: ORAL_TABLET | ORAL | 0 refills | Status: DC

## 2017-04-30 MED ORDER — PANTOPRAZOLE SODIUM 40 MG PO TBEC: 40.0000 mg | DELAYED_RELEASE_TABLET | Freq: Every day | ORAL | 2 refills | Status: DC

## 2017-04-30 MED ORDER — FAMOTIDINE 20 MG PO TABS: ORAL_TABLET | ORAL | 11 refills | Status: DC

## 2017-04-30 NOTE — Assessment & Plan Note (Signed)
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

## 2017-04-30 NOTE — Progress Notes (Signed)
Subjective:     Patient ID: Alex Atkinson, male   DOB: 03-27-1951,     MRN: [id number]  HPI  58 yowm never smoker Vet perfectly healthy baseline then severe cough  March 2015 rx pred/ alb/atrovent NS/ no abx resolved 100% then spring/summer of 2017 cough wax and waned since then  with neg w/u by ENT Lucia Gaskins) ? Worse with ex / better sleeping then a lot worse since early August 2018 during camping trip at [employer] then acute onset aches/fever/headache > Ehinger 04/02/17 rec augmentin /zpak > 30% better then  To ER 04/24/17 severe pain all over / hand swelling > [place]opp 04/24/17  rec doxy/prednisone and since then RMSF titers pos as well as RA/ANA (speckled) and pt much better but conce   04/30/2017 1st [facility] office visit/ Ayyub Krall   Chief Complaint  Patient presents with  . Pulmonary Consult    Referred by Dr. Marisue Humble for eval of PF.  Pt states he was dxed with PNA in mid Aug 2018.  He states has had a cough off and on for approx 1 yr.   no noct coughing/ some worse with exertion and feels located in suprasternal noctch where feels typical day > noct x one year HA/ body aches improving/ resp to nsaids and initiation of pred rx by [facility] 04/24/17  Not limited by breathing from desired activities    No obvious day to day or daytime variability or assoc excess/ purulent sputum or mucus plugs or hemoptysis or cp or chest tightness, subjective wheeze or overt sinus or hb symptoms. No unusual exp hx or h/o childhood pna/ asthma or knowledge of premature birth.  Sleeping ok flat without nocturnal  or early am exacerbation  of respiratory  c/o's or need for noct saba. Also denies any obvious fluctuation of symptoms with weather or environmental changes or other aggravating or alleviating factors except as outlined above   Current Allergies, Complete Past Medical History, Past Surgical History, Family History, and Social History were reviewed in [employer] record.  ROS  The  following are not active complaints unless bolded sore throat, dysphagia, dental problems, itching, sneezing,  nasal congestion or disharge of excess mucus or purulent secretions, ear ache,   fever, chills, sweats, unintended wt loss or wt gain, classically pleuritic or exertional cp,  orthopnea pnd or leg swelling, presyncope, palpitations, abdominal pain, anorexia, nausea, vomiting, diarrhea  or change in bowel habits or bladder habits, change in stools or change in urine, dysuria, hematuria,  rash, arthralgias, visual complaints, headache, numbness, weakness or ataxia or problems with walking or coordination,  change in mood/affect or memory.        Current Meds  Medication Sig  . benzonatate (TESSALON) 100 MG capsule Take 1 capsule (100 mg total) by mouth every 8 (eight) hours.  Marland Kitchen doxycycline (VIBRA-TABS) 100 MG tablet Take 100 mg by mouth 2 (two) times daily.  . [DISCONTINUED] budesonide-formoterol (SYMBICORT) 160-4.5 MCG/ACT inhaler Inhale 1 puff into the lungs 2 (two) times daily.  . [DISCONTINUED] calcium carbonate (TUMS - DOSED IN MG ELEMENTAL CALCIUM) 500 MG chewable tablet Chew 1 tablet by mouth as needed for indigestion.   . [DISCONTINUED] famotidine (PEPCID) 20 MG tablet Take 20 mg by mouth as needed for heartburn or indigestion.  . [DISCONTINUED] predniSONE (DELTASONE) 20 MG tablet Take 20 mg by mouth 3 (three) times daily.          Review of Systems     Objective:  Physical Exam amb wm nad   Wt Readings from Last 3 Encounters:  04/30/17 259 lb (117.5 kg)  04/24/17 255 lb (115.7 kg)  09/22/14 266 lb (120.7 kg)    Vital signs reviewed - Note on arrival 02 sats  96% on RA    HEENT: nl dentition, turbinates bilaterally, and oropharynx. Nl external ear canals without cough reflex   NECK :  without JVD/Nodes/TM/ nl carotid upstrokes bilaterally   LUNGS: no acc muscle use,  Nl contour chest which is clear to A and P bilaterally without cough on insp or exp  maneuvers   CV:  RRR  no s3 or murmur or increase in P2, and no edema   ABD:  Obese soft and nontender with nl inspiratory excursion in the supine position. No bruits or organomegaly appreciated, bowel sounds nl  MS:  Nl gait/ ext warm without deformities, calf tenderness, cyanosis or clubbing No obvious joint restrictions   SKIN: warm and dry without lesions    NEURO:  alert, approp, nl sensorium with  no motor or cerebellar deficits apparent.      I personally reviewed images and agree with radiology impression as follows:  Chest CT 04/24/17 Extensive patchy subpleural and perilobular reticulation, ground-glass attenuation, mild architectural distortion and minimal traction bronchiolectasis throughout both lungs without a clear basilar gradient and without frank honeycombing. 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 Usual interstitial pneumonia (UIP) is considered less likely     Lab Results  Component Value Date   ESRSEDRATE 57 (H) 04/24/2017       Assessment:

## 2017-04-30 NOTE — Patient Instructions (Addendum)
Prednisone 10 mg take  4 each am x 2 days,   2 each am x 2 days,  1 each am x 2 days and stop   Doxycycline 100 mg twice daily before meals with large glass of water   GERD (REFLUX)  is an extremely common cause of respiratory symptoms just like yours , many times with no obvious heartburn at all.    It can be treated with medication, but also with lifestyle changes including elevation of the head of your bed (ideally with 6 inch  bed blocks),  Smoking cessation, avoidance of late meals, excessive alcohol, and avoid fatty foods, chocolate, peppermint, colas, red wine, and acidic juices such as orange juice.  NO MINT OR MENTHOL PRODUCTS SO NO COUGH DROPS   USE SUGARLESS CANDY INSTEAD (Jolley ranchers or Stover's or Life Savers) or even ice chips will also do - the key is to swallow to prevent all throat clearing. NO OIL BASED VITAMINS - use powdered substitutes.    Pantoprazole (protonix) 40 mg   Take  30-60 min before first meal of the day and Pepcid (famotidine)  20 mg one @  bedtime until return to office - this is the best way to tell whether stomach acid is contributing to your problem.     Please schedule a follow up office visit in 6 weeks, call sooner if needed

## 2017-04-30 NOTE — Assessment & Plan Note (Signed)
Upper airway cough syndrome (previously labeled PNDS) , is  so named because it's frequently impossible to sort out how much is  CR/sinusitis with freq throat clearing (which can be related to primary GERD)   vs  causing  secondary (" extra esophageal")  GERD from wide swings in gastric pressure that occur with throat clearing, often  promoting self use of mint and menthol lozenges that reduce the lower esophageal sphincter tone and exacerbate the problem further in a cyclical fashion.   These are the same pts (now being labeled as having "irritable larynx syndrome" by some cough centers) who not infrequently have a history of having failed to tolerate ace inhibitors,  dry powder inhalers or biphosphonates or report having atypical/extraesophageal reflux symptoms that don't respond to standard doses of PPI  and are easily confused as having aecopd or asthma flares by even experienced allergists/ pulmonologists (myself included).    Will try max rx for gerd/ diet and then f/u in 6 weeks to see if full cough algorithm needed   Total time devoted to counseling  > 50 % of initial 60 min office visit:  review case with pt/wife discussion of options/alternatives/ personally creating written customized instructions  in presence of pt  then going over those specific  Instructions directly with the pt including how to use all of the meds but in particular covering each new medication in detail and the difference between the maintenance= "automatic" meds and the prns using an action plan format for the latter (If this problem/symptom => do that organization reading Left to right).  Please see AVS from this visit for a full list of these instructions which I personally wrote for this pt and  are unique to this visit.

## 2017-05-07 DIAGNOSIS — A770 Spotted fever due to Rickettsia rickettsii: Secondary | ICD-10-CM | POA: Diagnosis not present

## 2017-05-14 DIAGNOSIS — M791 Myalgia: Secondary | ICD-10-CM | POA: Diagnosis not present

## 2017-05-14 DIAGNOSIS — R531 Weakness: Secondary | ICD-10-CM | POA: Diagnosis not present

## 2017-05-21 ENCOUNTER — Ambulatory Visit (INDEPENDENT_AMBULATORY_CARE_PROVIDER_SITE_OTHER): Payer: Medicare Other | Admitting: Internal Medicine

## 2017-05-21 ENCOUNTER — Telehealth: Payer: Self-pay | Admitting: Internal Medicine

## 2017-05-21 VITALS — Wt 258.0 lb

## 2017-05-21 DIAGNOSIS — M791 Myalgia, unspecified site: Secondary | ICD-10-CM | POA: Diagnosis not present

## 2017-05-21 DIAGNOSIS — R918 Other nonspecific abnormal finding of lung field: Secondary | ICD-10-CM

## 2017-05-21 DIAGNOSIS — J849 Interstitial pulmonary disease, unspecified: Secondary | ICD-10-CM

## 2017-05-21 DIAGNOSIS — Z23 Encounter for immunization: Secondary | ICD-10-CM | POA: Diagnosis not present

## 2017-05-21 DIAGNOSIS — R0609 Other forms of dyspnea: Secondary | ICD-10-CM

## 2017-05-21 MED ORDER — PREDNISONE 10 MG PO TABS
40.0000 mg | ORAL_TABLET | Freq: Every day | ORAL | 1 refills | Status: AC
Start: 2017-05-21 — End: ?

## 2017-05-21 NOTE — Telephone Encounter (Signed)
[name] is requesting to speak directly to [facility] regarding pt's prednisone dosage.  Dr. Hale Bogus cell phone # is [phone].  MW please advise after you've spoken to [name].  Thanks.

## 2017-05-21 NOTE — Telephone Encounter (Signed)
Discussed with Dr Megan Salon - appears he may have underlying collagen vasc dz so should seek rheum eval and in meantime taper to lowest effective prednisone dose using slower taper than he's been using to arrive at that dose

## 2017-05-21 NOTE — Progress Notes (Signed)
[facility]  Reason for Consult: Cough, shortness of breath and myalgias Referring Physician: Dr. Coral Ceo  Assessment: It is not entirely clear to me what is causing his chronic cough and more acute illness. However, I do not think that he had [facility] spotted fever. The antibody test is notoriously falsely positive and his more acute illness starting in early August did not really fit the pattern of [facility] spotted fever. 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 He also has prominent autoimmune features with myalgias, positive ANA and positive rheumatoid factor. I discussed his situation with Dr. Melvyn Novas today. I will increase his prednisone and try to find the lowest possible dose that will control his symptoms while we conduct further evaluation.   Plan: 1. Increase prednisone back to 40 mg daily for several days then taper slowly to find the lowest dose that will control his symptoms 2. Sputum for AFB culture 3. Blood work including CK, aldolase, ANCA screen, anti-double-stranded DNA antibody, CCP antibody, and other autoantibodies including JO 1, PM SCL, SCL 70, PL 7 and PL 12 4. Consider rheumatology evaluation 5. He has follow-up with Dr. Melvyn Novas on 06/15/2017 6. I will follow-up with him by phone early next week  Patient Active Problem List   Diagnosis Date Noted  . Pulmonary infiltrates 04/30/2017    Priority: High  . DOE (dyspnea on exertion) 05/22/2017    Priority: Medium  . Myalgia 05/22/2017    Priority: Medium  . Chronic cough 04/30/2017    Priority: Medium  . S/P laparoscopic cholecystectomy + ERCP Nov 2015 07/14/2014  . Testosterone deficiency 07/23/2012  . EKG abnormalities 07/23/2012  . Hyperlipidemia 05/09/2011    Patient's Medications  New Prescriptions   PREDNISONE (DELTASONE) 10 MG TABLET    Take 4 tablets (40 mg total) by mouth daily with breakfast.  Previous Medications   BENZONATATE  (TESSALON) 100 MG CAPSULE    Take 1 capsule (100 mg total) by mouth every 8 (eight) hours.   FAMOTIDINE (PEPCID) 20 MG TABLET    One at bedtime   PANTOPRAZOLE (PROTONIX) 40 MG TABLET    Take 1 tablet (40 mg total) by mouth daily. Take 30-60 min before first meal of the day  Modified Medications   No medications on file  Discontinued Medications   DOXYCYCLINE (VIBRA-TABS) 100 MG TABLET    Take 100 mg by mouth 2 (two) times daily.   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 An ENT evaluation by Dr. Radene Journey last year was unremarkable. In June of this year he noticed that his cough was slightly worse and he began to develop dyspnea on exertion. He went on a camping trip from August 1-4 in [facility]. While there he developed sudden onset of headache and malaise. After returning home he continued to feel poorly and developed "flulike symptoms" and severe myalgias. He had subjective fevers and mild night sweats. He had no rash.  He saw his primary care physician, Dr. Marisue Humble on 04/02/2017. A CBC and complete metabolic panel were both completely normal. A chest x-ray was performed and showed:  "Increased  interstitial markings bilaterally suggestive of acute interstitial pneumonia superimposed upon chronic bronchitic changes. There may be early alveolar pneumonia in the left lower lobe. No overt CHF."  He was treated for probable community-acquired pneumonia with amoxicillin clavulanate, azithromycin, Symbicort, Tessalon Perles and NSAIDs. When reevaluated on 04/16/2017 his cough and shortness of breath were better but he still had painful myalgias. He was unable to play golf or do yard work like he normally did. On 04/24/2017 his pain was so intense that he had difficulty  standing and walking. He went to the emergency department where his sedimentation rate was elevated at 57 and his C-reactive protein was elevated at 51. Lyme antibodies were negative. A repeat chest x-ray was read as showing:  "Stable diffuse interstitial prominence throughout the lungs. This could represent chronic interstitial lung disease. Cannot exclude superimposed acute interstitial pneumonia."  A CT scan was performed which showed:  "Extensive patchy subpleural and perilobular reticulation, ground-glass attenuation, mild architectural distortion and minimal traction bronchiolectasis throughout both lungs without a clear basilar gradient and without frank honeycombing. 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 Usual interstitial pneumonia (UIP) is considered less likely."  Later that afternoon he was seen by a close friend, Dr. Evalina Field, who thought he might have cryptogenic organizing pneumonia. He started him on doxycycline and a prednisone taper. After 2 days of taking 60 mg of prednisone he felt dramatically better. His cough, shortness of breath, myalgias, headaches and fevers resolved completely.  He followed up with Dr. Marisue Humble on 04/27/2017. [facility] spotted fever antibody was noted to be positive at 1.84, an ANA was positive and his rheumatoid factor was elevated at 20.8.  He underwent a pulmonary evaluation by Dr. Clois Comber on 04/30/2017. He felt that he might have a postinflammatory syndrome caused by prior [facility] Spotted Fever and acute upper airway syndrome. He was started on pantoprazole and famotidine and told to avoid tomatoes and spicy foods. He continued his prednisone and doxycycline for a total of 2 weeks through 05/07/2017. He noted that as soon as he got down to 20 mg of prednisone all of his symptoms started to come back fairly quickly.  He saw Dr. Marisue Humble again on  05/14/2017 and was started back on prednisone. Like before, his symptoms resolved promptly on 60 mg of prednisone but started to come back quickly once he decreased to 20 mg daily.  He is a never smoker but was around secondhand smoke as a child. His father was a heavy smoker who died of metastatic lung cancer in his 80s. Dr. Onnie Graham has no history of asthma. He has no previous history of pneumonia. He has no known exposures to tuberculosis. 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 He has lost about 15 pounds but believes that is related to the change in his diet. He has felt somewhat depressed because of his recent illness and inability to do his usual activities. This morning he told his wife, Alex Atkinson, that he felt like giving up. He tells me that he feels like he has aged 10 years in the past 2 months.   Review of Systems: Review of Systems  Constitutional: Positive for diaphoresis, fever, malaise/fatigue and weight loss. Negative for chills.  HENT: Negative for congestion and sore throat.   Eyes: Negative for blurred vision and redness.  Respiratory: Positive for cough, sputum production and shortness of breath. Negative  for hemoptysis and wheezing.        He will occasionally bring up a little bit of yellow sputum in the morning when coughing.  Cardiovascular: Positive for leg swelling. Negative for chest pain, palpitations and orthopnea.  Gastrointestinal: Negative for abdominal pain, constipation, diarrhea, heartburn, nausea and vomiting.  Genitourinary: Negative for dysuria.  Musculoskeletal: Positive for joint pain and myalgias. Negative for back pain and neck pain.       No history of Raynaud's phenomenon.  Skin: Negative for rash.  Neurological: Positive for headaches. Negative for dizziness.  Psychiatric/Behavioral: Positive for depression.      Past Medical History:  Diagnosis Date  . Diverticulosis of  colon 2005   Dr Fuller Plan  . Hemorrhoid 09/06/14   developed ? hemorrhoid  . Mononucleosis 1973  . PVC's (premature ventricular contractions)    caffeine induced; evaluated by Dr Aldona Bar    Social History  Substance Use Topics  . Smoking status: Never Smoker  . Smokeless tobacco: Never Used  . Alcohol use 0.0 oz/week     Comment: weekends occasional per pt.    Family History  Problem Relation Age of Onset  . Lung cancer Father        cns metastases  . Dementia Mother   . Stroke Maternal Grandmother 87  . Leukemia Daughter        CML  . Diabetes Neg Hx   . Heart disease Neg Hx   . Colon cancer Neg Hx    No Known Allergies  OBJECTIVE: Vitals:   05/21/17 1508  Weight: 258 lb (117 kg)   Body mass index is 34.99 kg/m.   Physical Exam  Constitutional: He is oriented to person, place, and time.  He is in good spirits but he has a good friend and I suspect he is putting on a good show for me. He is accompanied by his wife, Alex Atkinson, who is clearly very worried about him.  HENT:  Mouth/Throat: No oropharyngeal exudate.  Eyes: Conjunctivae are normal.  Neck: Neck supple.  Cardiovascular: Normal rate and regular rhythm.   No murmur heard. Pulmonary/Chest: He has no wheezes. He has rales.  He has slightly tachypneic when talking. There are crackles in his lung bases posteriorly.  Abdominal: Soft. He exhibits no distension. There is no tenderness.  Musculoskeletal: Normal range of motion. He exhibits edema. He exhibits no tenderness.  He has 1+ nonpitting edema around his ankles.  Lymphadenopathy:       Head (right side): No submandibular adenopathy present.       Head (left side): No submandibular adenopathy present.    He has no cervical adenopathy.    He has no axillary adenopathy.       Right: No epitrochlear adenopathy present.       Left: No epitrochlear adenopathy present.  Neurological: He is alert and oriented to person, place, and time. Gait normal.  Skin: No rash  noted.  Psychiatric: Mood and affect normal.    Microbiology: No results found for this or any previous visit (from the past 240 hour(s)).  Michel Bickers, MD [facility] [phone] pager   [phone] cell 05/22/2017, 9:13 AM

## 2017-05-22 DIAGNOSIS — R0609 Other forms of dyspnea: Secondary | ICD-10-CM

## 2017-05-22 DIAGNOSIS — M791 Myalgia, unspecified site: Secondary | ICD-10-CM | POA: Insufficient documentation

## 2017-05-22 LAB — JO-1 ANTIBODY-IGG: Jo-1 Autoabs: <1 AI

## 2017-05-22 LAB — ANCA SCREEN W REFLEX TITER: ANCA SCREEN: POSITIVE — AB

## 2017-05-22 LAB — CK: Total CK: 46 U/L (ref 44–196)

## 2017-05-22 LAB — ALDOLASE: Aldolase: 4.5 U/L (ref <=8.1)

## 2017-05-22 LAB — CYCLIC CITRUL PEPTIDE ANTIBODY, IGG

## 2017-05-22 LAB — ANTI-DNA ANTIBODY, DOUBLE-STRANDED

## 2017-05-22 LAB — ANTI-SCLERODERMA ANTIBODY: SCLERODERMA (SCL-70) (ENA) ANTIBODY, IGG: NEGATIVE AI

## 2017-05-22 LAB — MPO/PR-3 (ANCA) ANTIBODIES: MYELOPEROXIDASE ABS: 25.7 AI — AB

## 2017-05-25 ENCOUNTER — Other Ambulatory Visit: Payer: Self-pay | Admitting: Internal Medicine

## 2017-05-25 DIAGNOSIS — I7782 Antineutrophilic cytoplasmic antibody (ANCA) vasculitis: Secondary | ICD-10-CM

## 2017-05-25 DIAGNOSIS — I776 Arteritis, unspecified: Secondary | ICD-10-CM

## 2017-05-26 ENCOUNTER — Other Ambulatory Visit: Payer: Medicare Other

## 2017-05-26 DIAGNOSIS — I776 Arteritis, unspecified: Secondary | ICD-10-CM | POA: Diagnosis not present

## 2017-05-26 DIAGNOSIS — I7782 Antineutrophilic cytoplasmic antibody (ANCA) vasculitis: Secondary | ICD-10-CM

## 2017-05-27 LAB — URINALYSIS
BILIRUBIN URINE: NEGATIVE
Glucose, UA: NEGATIVE
HGB URINE DIPSTICK: NEGATIVE
KETONES UR: NEGATIVE
Leukocytes, UA: NEGATIVE
NITRITE: NEGATIVE
PH: 5.5 (ref 5.0–8.0)
Protein, ur: NEGATIVE
Specific Gravity, Urine: 1.021 (ref 1.001–1.03)

## 2017-05-27 LAB — URINALYSIS, MICROSCOPIC ONLY
Bacteria, UA: NONE SEEN /HPF
Hyaline Cast: NONE SEEN /LPF
Squamous Epithelial / LPF: NONE SEEN /HPF (ref <=5)
WBC UA: NONE SEEN /HPF (ref 0–5)

## 2017-05-28 LAB — QUANTIFERON TB GOLD ASSAY (BLOOD)
Mitogen-Nil: 0.7 IU/mL
QUANTIFERON(R)-TB GOLD: NEGATIVE
Quantiferon Nil Value: 0.03 IU/mL

## 2017-06-01 LAB — CRYOGLOBULIN: CRYOGLOBULIN, QUALITATIVE ANALYSIS: NOT DETECTED

## 2017-06-01 LAB — C3 AND C4
C3 Complement: 126 mg/dL (ref 82–185)
C4 Complement: 23 mg/dL (ref 15–53)

## 2017-06-01 LAB — MYOSITIS ASSESSR
EJ AUTOABS: NOT DETECTED
KU AUTOABS: NOT DETECTED
MI-2 AUTOABS: NOT DETECTED
OJ AUTOABS: NOT DETECTED
PL-12 AUTOABS: NOT DETECTED
PL-7 Autoabs: NOT DETECTED
SRP Autoabs: NOT DETECTED

## 2017-06-01 LAB — HEPATITIS B SURFACE ANTIBODY,QUALITATIVE: HEP B S AB: REACTIVE — AB

## 2017-06-01 LAB — HEPATITIS C ANTIBODY
HEP C AB: NONREACTIVE
SIGNAL TO CUT-OFF: 0.02 (ref <1.00)

## 2017-06-01 LAB — GLOMERULAR BASEMENT MEMBRANE ANTIBODIES

## 2017-06-01 LAB — ANA: ANA: NEGATIVE

## 2017-06-01 LAB — HEPATITIS B SURFACE ANTIGEN: Hepatitis B Surface Ag: NONREACTIVE

## 2017-06-02 ENCOUNTER — Telehealth: Payer: Self-pay | Admitting: Internal Medicine

## 2017-06-02 NOTE — Telephone Encounter (Signed)
I received a text message from Dr. Enis Slipper today stating that he was feeling reasonably well while taking 35 mg of prednisone daily. He cut down to 30 mg yesterday and has had the aching pain returned very quickly. He took an extra 10 mg today. I called and left a message for him to work his way back down to 35 mg daily and then plan on staying there until he follows up with Dr. Melvyn Novas on 06/15/2017.

## 2017-06-03 LAB — ANTI-PM/SCL-100 ANTIBODY, EIA: ANTI PM/SCL 100 AB (EIA): <20 Units (ref <20)

## 2017-06-04 ENCOUNTER — Telehealth: Payer: Self-pay | Admitting: Internal Medicine

## 2017-06-04 NOTE — Telephone Encounter (Signed)
Called and spoke with pt and he stated the following:  The prednisone he feels is not working for him.  He has a pending appt on Tuesday at 3 but he is willing to come in today or Friday if MW can see him--he stated that he lives about 10 mins away. MW please advise. Thanks   1.  He has been taking the prednisone 40 to 60 mg daily for the last 40 days and feels that this has not really helped him.  He has not been able to sleep due to the high doses he has been on.    2.  He stated that he aches in all of his joints and muscles and has to take tylenol to help with this every morning before he can get out of bed.   3.  If he tried to cut back on the prednisone his cough comes back.   4.  He has concerns about his dx and the direction of what his care will be and what about the affects of the pred on his body.

## 2017-06-04 NOTE — Telephone Encounter (Signed)
Patient has been scheduled with MW at 945 tomorrow. Verbalized understanding. Nothing else needed at time of call.

## 2017-06-04 NOTE — Telephone Encounter (Signed)
I can't address these issues over the phone but when he comes in we can address controlling the cough better regardless of the dose of prednisone.  Ok to add on to the am tomorrow or after 4 pm but not between 1 and 3   Looking over Dr Hale Bogus notes/ labs it looks like rheumatology eval is the next step and if hasn't been arranged needs to happen asap and I can serve as bridge for now

## 2017-06-05 ENCOUNTER — Encounter: Payer: Self-pay | Admitting: Internal Medicine

## 2017-06-05 ENCOUNTER — Other Ambulatory Visit (INDEPENDENT_AMBULATORY_CARE_PROVIDER_SITE_OTHER): Payer: Medicare Other

## 2017-06-05 ENCOUNTER — Ambulatory Visit (INDEPENDENT_AMBULATORY_CARE_PROVIDER_SITE_OTHER): Payer: Medicare Other | Admitting: Internal Medicine

## 2017-06-05 ENCOUNTER — Ambulatory Visit (INDEPENDENT_AMBULATORY_CARE_PROVIDER_SITE_OTHER)
Admission: RE | Admit: 2017-06-05 | Discharge: 2017-06-05 | Disposition: A | Discharge status: Home / Self Care | Payer: Medicare Other | Source: Ambulatory Visit | Attending: Internal Medicine | Admitting: Internal Medicine

## 2017-06-05 VITALS — BP 132/80 | HR 83 | Ht 72.0 in | Wt 256.4 lb

## 2017-06-05 DIAGNOSIS — R918 Other nonspecific abnormal finding of lung field: Secondary | ICD-10-CM | POA: Diagnosis not present

## 2017-06-05 DIAGNOSIS — R05 Cough: Secondary | ICD-10-CM

## 2017-06-05 DIAGNOSIS — R0602 Shortness of breath: Secondary | ICD-10-CM | POA: Diagnosis not present

## 2017-06-05 DIAGNOSIS — R053 Chronic cough: Secondary | ICD-10-CM

## 2017-06-05 LAB — CBC WITH DIFFERENTIAL/PLATELET
BASOS ABS: 0.1 10*3/uL (ref 0.0–0.1)
Basophils Relative: 0.8 % (ref 0.0–3.0)
Eosinophils Absolute: 0.2 10*3/uL (ref 0.0–0.7)
Eosinophils Relative: 1.4 % (ref 0.0–5.0)
HCT: 40.1 % (ref 39.0–52.0)
Hemoglobin: 13.2 g/dL (ref 13.0–17.0)
LYMPHS ABS: 3.1 10*3/uL (ref 0.7–4.0)
Lymphocytes Relative: 18.6 % (ref 12.0–46.0)
MCHC: 33 g/dL (ref 30.0–36.0)
MCV: 89.7 fl (ref 78.0–100.0)
MONOS PCT: 7.2 % (ref 3.0–12.0)
Monocytes Absolute: 1.2 10*3/uL — ABNORMAL HIGH (ref 0.1–1.0)
NEUTROS ABS: 12 10*3/uL — AB (ref 1.4–7.7)
NEUTROS PCT: 72 % (ref 43.0–77.0)
PLATELETS: 255 10*3/uL (ref 150.0–400.0)
RBC: 4.47 Mil/uL (ref 4.22–5.81)
RDW: 14.1 % (ref 11.5–15.5)
WBC: 16.6 10*3/uL — ABNORMAL HIGH (ref 4.0–10.5)

## 2017-06-05 LAB — SEDIMENTATION RATE: Sed Rate: 53 mm/hr — ABNORMAL HIGH (ref 0–20)

## 2017-06-05 MED ORDER — PANTOPRAZOLE SODIUM 40 MG PO TBEC: DELAYED_RELEASE_TABLET | ORAL | 2 refills | Status: AC

## 2017-06-05 MED ORDER — TRAMADOL HCL 50 MG PO TABS: ORAL_TABLET | ORAL | 0 refills | Status: DC

## 2017-06-05 MED ORDER — MELOXICAM 7.5 MG PO TABS: 7.5000 mg | ORAL_TABLET | Freq: Every day | ORAL | 2 refills | Status: DC

## 2017-06-05 NOTE — Progress Notes (Signed)
Spoke with pt and notified of results per Dr. Wert. Pt verbalized understanding and denied any questions. 

## 2017-06-05 NOTE — Patient Instructions (Addendum)
Change protonix to Take 30- 60 min before your first and last meals of the day and continue pepcid 20 mg at bedtime  Start meloxicam 7.5 mg twice daily with meals   Prednisone 10 mg to 60 mg until better then go 5 mg every 3 days - take with breakfast   Take delsym two tsp every 12 hours and supplement if needed with  tramadol 50 mg up to 1-2 every 4 hours to suppress the urge to cough. Swallowing water or using ice chips/non mint and menthol containing candies (such as lifesavers or sugarless jolly ranchers) are also effective.  You should rest your voice and avoid activities that you know make you cough.  Once you have eliminated the cough for 3 straight days try reducing the tramadol first,  then the delsym as tolerated.    Please see patient coordinator before you leave today  to schedule rheumatology eval   Please remember to go to the lab and x-ray department downstairs in the basement  for your tests - we will call you with the results when they are available.

## 2017-06-05 NOTE — Progress Notes (Signed)
Subjective:     Patient ID: Alex Atkinson, male   DOB: 10-29-1950,     MRN: [id number]    Brief patient profile:   17 yowm never smoker Vet perfectly healthy baseline then severe cough  March 2015 rx pred/ alb/atrovent NS/ no abx resolved 100% then spring/summer of 2017 cough wax and waned since then  with neg w/u by ENT Lucia Gaskins) ? Worse with ex / better sleeping then a lot worse since early August 2018 during camping trip at [place] with  acute onset aches/fever/headache > [place] 04/02/17 rec augmentin /zpak > 30% better then  To ER 04/24/17 severe pain all over / hand swelling > [place] 04/24/17  rec doxy/prednisone and since then RMSF titers pos as well as RA/ANA (speckled) but flares with arthritis> cough every time he tapers pred so referred to pulmonary clinic 06/05/2017 by Dr   Hopper/ seen also with Dr Megan Salon who does not think this is an infectious process.    History of Present Illness  04/30/2017 1st [facility] office visit/ Junius Faucett   Chief Complaint  Patient presents with  . Pulmonary Consult    Referred by Dr. Marisue Humble for eval of PF.  Pt states he was dxed with PNA in mid Aug 2018.  He states has had a cough off and on for approx 1 yr.   no noct coughing/ some worse with exertion and feels located in suprasternal noctch where feels typical day > noct x one year HA/ body aches improving/ resp to nsaids and initiation of pred rx by [facility] 04/24/17  Not limited by breathing from desired activities   Rec Prednisone 10 mg take  4 each am x 2 days,   2 each am x 2 days,  1 each am x 2 days and stop  Doxycycline 100 mg twice daily before meals with large glass of water GERD (REFLUX)   Pantoprazole (protonix) 40 mg   Take  30-60 min before first meal of the day and Pepcid (famotidine)  20 mg one @  bedtime until return to office - this is the best way to tell whether stomach acid is contributing to your problem.      06/05/2017  Extended f/u ov/Sheyann Sulton re:  ? ILD /  On  prednisone consistently since 04/24/17  Chief Complaint  Patient presents with  . Follow-up    He states unable to taper the pred down without having cough and increased SOB. He is currently at 40 mg daily.   ? Which Joints hurt/which stiff   "every one except spine" better on pred at 60 mg and not using any nsaids but hurts so bad /so stiff "can barely move" when tapers to 30.  Assoc dry cough/ no sputum production at all   Suggested   on 05/21/17 he see rheum but not arranged yet   No obvious day to day or daytime variability or assoc excess/ purulent sputum or mucus plugs or hemoptysis or cp or chest tightness, subjective wheeze or overt sinus or hb symptoms. No unusual exp hx or h/o childhood pna/ asthma or knowledge of premature birth.    Also denies any obvious fluctuation of symptoms with weather or environmental changes or other aggravating or alleviating factors except as outlined above   Current Allergies, Complete Past Medical History, Past Surgical History, Family History, and Social History were reviewed in [employer] record.  ROS  The following are not active complaints unless bolded Hoarseness, sore throat, dysphagia,  dental problems, itching, sneezing,  nasal congestion or discharge of excess mucus or purulent secretions, ear ache,   fever, chills, sweats, unintended wt loss or wt gain, classically pleuritic or exertional cp,  orthopnea pnd or leg swelling, presyncope, palpitations, abdominal pain, anorexia, nausea, vomiting, diarrhea  or change in bowel habits or change in bladder habits, change in stools or change in urine, dysuria, hematuria,  rash, arthralgias/muscle aches, visual complaints, headache, numbness, weakness or ataxia or problems with walking or coordination,  change in mood/affect or memory.           Current Meds  Medication Sig  . famotidine (PEPCID) 20 MG tablet One at bedtime  . pantoprazole (PROTONIX) 40 MG tablet Take 30- 60 min  before your first and last meals of the day  . predniSONE (DELTASONE) 10 MG tablet Take 4 tablets (40 mg total) by mouth daily with breakfast.  . [  pantoprazole (PROTONIX) 40 MG tablet Take 1 tablet (40 mg total) by mouth daily. Take 30-60 min before first meal of the day            Objective:   Physical Exam  amb  Miserable appearing  wm nad   06/05/2017      256   04/30/17 259 lb (117.5 kg)  04/24/17 255 lb (115.7 kg)  09/22/14 266 lb (120.7 kg)    Vital signs reviewed - Note on arrival 02 sats  97% on RA    HEENT: nl dentition, turbinates bilaterally, and oropharynx. Nl external ear canals without cough reflex   NECK :  without JVD/Nodes/TM/ nl carotid upstrokes bilaterally   LUNGS: no acc muscle use,  Nl contour chest  With a few insp pops / squeaks  = both bases and cough on deep insp   CV:  RRR  no s3 or murmur or increase in P2, and no edema   ABD:  Obese soft and nontender with nl inspiratory excursion in the supine position. No bruits or organomegaly appreciated, bowel sounds nl  MS:  Nl gait/ ext warm without deformities, calf tenderness, cyanosis or clubbing No obvious joint restrictions   SKIN: warm and dry without lesions    NEURO:  alert, approp, nl sensorium with  no motor or cerebellar deficits apparent.    CXR PA and Lateral:   06/05/2017 :    I personally reviewed images and agree with radiology impression as follows:   Pulmonary fibrosis with areas of scarring bilaterally, stable. No edema or consolidation. Stable cardiac silhouette.     Labs ordered 06/05/2017  / reviewed Lab Results  Component Value Date   WBC 16.6 (H) 06/05/2017   HGB 13.2 06/05/2017   HCT 40.1 06/05/2017   MCV 89.7 06/05/2017   PLT 255.0 06/05/2017       EOS                       0       06/05/2017  Lab Results  Component Value Date   ESRSEDRATE 53 (H) 06/05/2017   ESRSEDRATE 57 (H) 04/24/2017        Assessment:

## 2017-06-06 ENCOUNTER — Encounter: Payer: Self-pay | Admitting: Internal Medicine

## 2017-06-07 NOTE — Assessment & Plan Note (Signed)
Of the three most common causes of  Sub-acute or recurrent or chronic cough, only one (GERD)  can actually contribute to/ trigger  the other two (asthma and post nasal drip syndrome)  and perpetuate the cylce of cough.  While not intuitively obvious, many patients with chronic low grade reflux do not cough until there is a primary insult that disturbs the protective epithelial barrier and exposes sensitive nerve endings.   This is typically viral but can be direct physical injury such as with an endotracheal tube.   The point is that once this occurs, it is difficult to eliminate the cycle  using anything but a maximally effective acid suppression regimen at least in the short run, accompanied by an appropriate diet to address non acid GERD and control / eliminate the cough itself for at least 3 days.   rec increase ppi to bid ac and use tramadol for cyclical cough  see avs for instructions unique to this ov

## 2017-06-07 NOTE — Assessment & Plan Note (Signed)
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 rheum eval not done  - 06/05/2017  Walked RA x 3 laps @ 185 ft each stopped due to  End of study, nl pace, no sob or desat  - ESR  06/05/2017 = 53 on pred 40 mg daily rec 60 mg daily unitl better then taper by 5 mg every 3 days and added melixicam for arthritis symptoms / referred to rheum officially   The most striking steroid responsive/ steroid dep symptoms aren't the cough or sob but the arthritis and "flu like symptoms" but note absence of fever, chills and 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  In meantime, The goal with a chronic steroid dependent illness is always arriving at the lowest effective dose that controls the disease/symptoms and not accepting a set "formula" which is based on statistics or guidelines that don't always take into account patient  variability or the natural hx of the dz in every individual patient, which may well vary over time.  For now therefore I recommend the patient maintain  60 mg pred until better and taper by 5 mg while using meloxicam to see if has same problem with taper of prednisone this time and if so at what dose pending formal rheum eval.  I had an extended discussion with the patient/wife  reviewing all relevant studies (extensive notes/ records in epic)  completed to date and  lasting 25 minutes of a 40  minute office  visit addressing  severe non-specific but potentially very serious refractory respiratory symptoms of uncertain and potentially multiple  etiologies.   Each maintenance medication was reviewed in detail including most importantly the difference between maintenance and prns and under what circumstances the prns are to be triggered using an action plan format that is not  reflected in the computer generated alphabetically organized AVS.    Please see AVS for specific instructions unique to this office visit that I personally wrote and verbalized to the the pt in detail and then reviewed with pt  by my nurse highlighting any changes in therapy/plan of care  recommended at today's visit.

## 2017-06-08 ENCOUNTER — Telehealth: Payer: Self-pay | Admitting: Internal Medicine

## 2017-06-08 NOTE — Telephone Encounter (Signed)
I spoke with Alex Atkinson last night. He is feeling a little bit better on his current prednisone dose. 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 He does not appear to have renal involvement. He is hoping to arrange a rheumatologic evaluation by Dr. Amil Amen this week.

## 2017-06-09 ENCOUNTER — Ambulatory Visit: Payer: Medicare Other | Admitting: Internal Medicine

## 2017-06-09 DIAGNOSIS — Z7952 Long term (current) use of systemic steroids: Secondary | ICD-10-CM | POA: Diagnosis not present

## 2017-06-09 DIAGNOSIS — Z6835 Body mass index (BMI) 35.0-35.9, adult: Secondary | ICD-10-CM | POA: Diagnosis not present

## 2017-06-09 DIAGNOSIS — E669 Obesity, unspecified: Secondary | ICD-10-CM | POA: Diagnosis not present

## 2017-06-09 DIAGNOSIS — M317 Microscopic polyangiitis: Secondary | ICD-10-CM | POA: Diagnosis not present

## 2017-06-09 DIAGNOSIS — M2550 Pain in unspecified joint: Secondary | ICD-10-CM | POA: Diagnosis not present

## 2017-06-10 ENCOUNTER — Ambulatory Visit: Payer: Medicare Other | Admitting: Internal Medicine

## 2017-06-15 ENCOUNTER — Telehealth: Payer: Self-pay | Admitting: *Deleted

## 2017-06-15 ENCOUNTER — Ambulatory Visit: Payer: Medicare Other | Admitting: Internal Medicine

## 2017-06-15 ENCOUNTER — Encounter: Payer: Self-pay | Admitting: Internal Medicine

## 2017-06-15 DIAGNOSIS — M317 Microscopic polyangiitis: Secondary | ICD-10-CM | POA: Diagnosis not present

## 2017-06-15 NOTE — Telephone Encounter (Signed)
Called the patient to let him know his letter is ready to pick up or he can call us to fax it somewhere. Had to leave a message.

## 2017-06-22 DIAGNOSIS — M317 Microscopic polyangiitis: Secondary | ICD-10-CM | POA: Diagnosis not present

## 2017-06-29 DIAGNOSIS — M317 Microscopic polyangiitis: Secondary | ICD-10-CM | POA: Diagnosis not present

## 2017-07-02 DIAGNOSIS — M317 Microscopic polyangiitis: Secondary | ICD-10-CM | POA: Diagnosis not present

## 2017-07-06 DIAGNOSIS — M317 Microscopic polyangiitis: Secondary | ICD-10-CM | POA: Diagnosis not present

## 2017-07-10 LAB — MYCOBACTERIA,CULT W/FLUOROCHROME SMEAR
MICRO NUMBER:: 81123336
SMEAR:: NONE SEEN
SPECIMEN QUALITY:: ADEQUATE

## 2017-07-30 DIAGNOSIS — M2550 Pain in unspecified joint: Secondary | ICD-10-CM | POA: Diagnosis not present

## 2017-07-30 DIAGNOSIS — M317 Microscopic polyangiitis: Secondary | ICD-10-CM | POA: Diagnosis not present

## 2017-07-30 DIAGNOSIS — Z6835 Body mass index (BMI) 35.0-35.9, adult: Secondary | ICD-10-CM | POA: Diagnosis not present

## 2017-07-30 DIAGNOSIS — E669 Obesity, unspecified: Secondary | ICD-10-CM | POA: Diagnosis not present

## 2017-07-30 DIAGNOSIS — Z7952 Long term (current) use of systemic steroids: Secondary | ICD-10-CM | POA: Diagnosis not present

## 2017-07-30 DIAGNOSIS — G5793 Unspecified mononeuropathy of bilateral lower limbs: Secondary | ICD-10-CM | POA: Diagnosis not present

## 2017-07-31 ENCOUNTER — Encounter: Payer: Self-pay | Admitting: Neurology

## 2017-08-07 ENCOUNTER — Telehealth: Payer: Self-pay | Admitting: Internal Medicine

## 2017-08-07 NOTE — Telephone Encounter (Signed)
Next consult available for MR is 09/21/2017.  MR please advise if this pt can be worked in sooner.  Thanks!

## 2017-08-10 NOTE — Telephone Encounter (Signed)
We are looking at making Tuesday PM  ILD clinic day 09/08/17. Check wit Raquel Sarna if there are any conflicts for 0/15/86 PM   Dr. Brand Males, M.D., [facility].C.P Pulmonary and Critical Care Medicine Staff Physician, [place] Director - Interstitial Lung Disease  Program  Pulmonary [facility] [place], [place], [address]  Pager: [phone], If no answer or between  15:00h - 7:00h: call [phone] Telephone: [phone]

## 2017-08-12 NOTE — Telephone Encounter (Signed)
MR, just looked at your schedule on Tuesday, 09/08/17 and we are only able to see your schedule for the morning due to it not being opened yet for the afternoon ILD clinic.  How can I go about scheduling pt for this day with you?

## 2017-08-14 NOTE — Telephone Encounter (Signed)
Talk to Alex Atkinson and open 09/08/17 PM for ILD only - 30 min slots. Future and other ILD clinics we will formally roll out

## 2017-08-17 ENCOUNTER — Telehealth: Payer: Self-pay | Admitting: Internal Medicine

## 2017-08-17 DIAGNOSIS — J849 Interstitial pulmonary disease, unspecified: Secondary | ICD-10-CM

## 2017-08-17 NOTE — Telephone Encounter (Signed)
MR,  Which day would work better for you out of 1/8 or 1/9? I will get pt scheduled for an appt as soon as you let me know which day will be better for pt to come in the afternoon to see you.  Thanks!

## 2017-08-17 NOTE — Telephone Encounter (Signed)
Dr Billy Fischer [facility] called him. Stated that patient Alex Atkinson is his a close friend and is progressing and has not heard from office about appt for 2nd opinion with me. Can you please   A) get patient to have HRCT without contrast  for ILD before he sees me  B) instead of 09/08/17 please gie him special appt for either 08/25/17 or 08/26/16 in PM - 30 min  Sending to [facility] and Triage for quick attention   Dr. Brand Males, M.D., [facility].C.P Pulmonary and Critical Care Medicine Staff Physician, [place] Director - Interstitial Lung Disease  Program  Pulmonary [place] at [place], [place], [address]  Pager: [phone], If no answer or between  15:00h - 7:00h: call [phone] Telephone: [phone]

## 2017-08-17 NOTE — Telephone Encounter (Signed)
Patient is calling about scheduling this appointment, patient states he is having some significant shortness of breath and has seen a significant change in the last week or so.  Please call to schedule at [phone].

## 2017-08-17 NOTE — Telephone Encounter (Signed)
Prefer 8th over 9th but both ok

## 2017-08-17 NOTE — Telephone Encounter (Signed)
Called pt letting him know we were going to get him to come in on 1/8. Pt stated that that day worked great for him to come in to see MR in the afternoon.  Told pt to come in at 12pm for his appt and that if we did not have a room avail at that time, I would bring the ILD packet to the lobby for him to fill out until we had a room available for him.  Pt expressed understanding and said that worked great.  Also told pt that we needed him to have a HRCT done before his visit with [place] on 1/8. Pt expressed understanding. Order placed for HRCT. Nothing further needed.

## 2017-08-17 NOTE — Telephone Encounter (Signed)
Called and left message for patient in regards to scheduling an appointment on 09/08/17 for afternoon ILD clinic with MR

## 2017-08-21 ENCOUNTER — Ambulatory Visit (INDEPENDENT_AMBULATORY_CARE_PROVIDER_SITE_OTHER)
Admission: RE | Admit: 2017-08-21 | Discharge: 2017-08-21 | Disposition: A | Discharge status: Home / Self Care | Payer: Medicare Other | Source: Ambulatory Visit | Attending: Internal Medicine | Admitting: Internal Medicine

## 2017-08-21 DIAGNOSIS — R911 Solitary pulmonary nodule: Secondary | ICD-10-CM | POA: Diagnosis not present

## 2017-08-21 DIAGNOSIS — J849 Interstitial pulmonary disease, unspecified: Secondary | ICD-10-CM | POA: Diagnosis not present

## 2017-08-24 ENCOUNTER — Inpatient Hospital Stay: Admission: RE | Admit: 2017-08-24 | Payer: Medicare Other | Source: Ambulatory Visit

## 2017-08-25 ENCOUNTER — Telehealth: Payer: Self-pay | Admitting: Internal Medicine

## 2017-08-25 ENCOUNTER — Institutional Professional Consult (permissible substitution): Payer: Medicare Other | Admitting: Internal Medicine

## 2017-08-25 NOTE — Telephone Encounter (Signed)
Pt is calling back trying to resched this apt. Current apt is for 12:00 today and Pt needs to know if he should still be coming to this apt or not. Cb is [phone].

## 2017-08-25 NOTE — Telephone Encounter (Signed)
Called patient 8:13 AM 08/25/2017 - asked about possibility of coming in 08/26/17 PM - which would be better due to PM meeting 08/25/2017 and also being double booked in current appt slot 08/25/2017 . He will look at his calendar and call me/office back. To be on safe side please call his cell around 9am/9.15am. Ideal is tomorrow .If not , keep appt at noon 08/25/2017   Dr. Brand Males, M.D., [facility].C.P Pulmonary and Critical Care Medicine Staff Physician, [place] Director - Interstitial Lung Disease  Program  Pulmonary [place] at [place], [place], [address]  Pager: [phone], If no answer or between  15:00h - 7:00h: call [phone] Telephone: [phone]

## 2017-08-25 NOTE — Telephone Encounter (Signed)
Pt calling to resched the apt for 1/7. Cb is [phone].

## 2017-08-25 NOTE — Telephone Encounter (Signed)
Spoke with pt, Alex Atkinson states MR wants to see him at 1:30. Pt understood and Alex Atkinson will add him to the schedule.. Nothing further is needed.

## 2017-08-26 ENCOUNTER — Ambulatory Visit (INDEPENDENT_AMBULATORY_CARE_PROVIDER_SITE_OTHER): Payer: Medicare Other | Admitting: Internal Medicine

## 2017-08-26 VITALS — BP 150/90 | HR 83

## 2017-08-26 DIAGNOSIS — J84112 Idiopathic pulmonary fibrosis: Secondary | ICD-10-CM | POA: Diagnosis not present

## 2017-08-26 DIAGNOSIS — R5383 Other fatigue: Secondary | ICD-10-CM

## 2017-08-26 DIAGNOSIS — R0602 Shortness of breath: Secondary | ICD-10-CM

## 2017-08-26 DIAGNOSIS — D849 Immunodeficiency, unspecified: Secondary | ICD-10-CM

## 2017-08-26 DIAGNOSIS — W6199XA Other contact with other birds, initial encounter: Secondary | ICD-10-CM | POA: Diagnosis not present

## 2017-08-26 DIAGNOSIS — J849 Interstitial pulmonary disease, unspecified: Secondary | ICD-10-CM | POA: Diagnosis not present

## 2017-08-26 DIAGNOSIS — D899 Disorder involving the immune mechanism, unspecified: Secondary | ICD-10-CM

## 2017-08-26 NOTE — Patient Instructions (Addendum)
ICD-10-CM   1. IPF (idiopathic pulmonary fibrosis) (HCC) J84.112 Pulse oximetry, overnight    Pulmonary function test  2. ILD (interstitial lung disease) ([place]) J84.9   3. Shortness of breath R06.02 ECHOCARDIOGRAM COMPLETE  4. Immunosuppressed status ([place]) D89.9   5. Other fatigue R53.83   6. Other contact with other birds, initial encounter W61.99XA    I am giving you diagnosis of IPF based on age, male gender, CT appearance of probable UIP pattern Whether you have independent vasculitis or not is not unclear; I think Is possible  Plan  - please get rid of all feathered pillows  - do ONO test on room air - start esbriet per prorotocol - come to [facility] on 09/03/17 after 3.30pm or after 2pm on 09/04/17 for video flexible bronchoscopy BAL rule out opportunistic infection -  Do Pre-bd spiro and dlco only. No lung volume or bd response. 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

## 2017-08-26 NOTE — Progress Notes (Signed)
Subjective:    Patient ID: Alex Atkinson, male    DOB: Dec 20, 1950, 67 y.o.   MRN: [id number]   HPI    Brief patient profile:   67 yowm never smoker Vet perfectly healthy baseline then severe cough  March 2015 rx pred/ alb/atrovent NS/ no abx resolved 100% then spring/summer of 2017 cough wax and waned since then  with neg w/u by ENT Lucia Gaskins) ? Worse with ex / better sleeping then a lot worse since early August 2018 during camping trip at [place] with  acute onset aches/fever/headache > Ehinger 04/02/17 rec augmentin /zpak > 30% better then  To ER 04/24/17 severe pain all over / hand swelling > Hopp 04/24/17  rec doxy/prednisone and since then RMSF titers pos as well as RA/ANA (speckled) but flares with arthritis> cough every time he tapers pred so referred to pulmonary clinic 06/05/2017 by Dr   Hopper/ seen also with Dr Megan Salon who does not think this is an infectious process.    History of Present Illness  04/30/2017 1st [facility] office visit/ W[facility]   Chief Complaint  Patient presents with  . Pulmonary Consult    Referred by Dr. Marisue Humble for eval of PF.  Pt states he was dxed with PNA in mid Aug 2018.  He states has had a cough off and on for approx 1 yr.   no noct coughing/ some worse with exertion and feels located in suprasternal noctch where feels typical day > noct x one year HA/ body aches improving/ resp to nsaids and initiation of pred rx by [facility] 04/24/17  Not limited by breathing from desired activities   Rec Prednisone 10 mg take  4 each am x 2 days,   2 each am x 2 days,  1 each am x 2 days and stop  Doxycycline 100 mg twice daily before meals with large glass of water GERD (REFLUX)   Pantoprazole (protonix) 40 mg   Take  30-60 min before first meal of the day and Pepcid (famotidine)  20 mg one @  bedtime until return to office - this is the best way to tell whether stomach acid is contributing to your problem.      06/05/2017  Extended f/u ov/Wert re:  ?  ILD /  On prednisone consistently since 04/24/17  Chief Complaint  Patient presents with  . Follow-up    He states unable to taper the pred down without having cough and increased SOB. He is currently at 40 mg daily.   ? Which Joints hurt/which stiff   "every one except spine" better on pred at 60 mg and not using any nsaids but hurts so bad /so stiff "can barely move" when tapers to 30.  Assoc dry cough/ no sputum production at all   Suggested   on 05/21/17 he see rheum but not arranged yet   No obvious day to day or daytime variability or assoc excess/ purulent sputum or mucus plugs or hemoptysis or cp or chest tightness, subjective wheeze or overt sinus or hb symptoms. No unusual exp hx or h/o childhood pna/ asthma or knowledge of premature birth.    Also denies any obvious fluctuation of symptoms with weather or environmental changes or other aggravating or alleviating factors except as outlined above    OV 08/26/2017  Chief Complaint  Patient presents with  . Advice Only    Referred by Dr. Roxan Hockey for ILD.  Pt is also a former [facility] pt. HRCT done  08/21/17.    Pt has been having troubles with his breathing. States he had a rituxan treatment but has been "huffing and puffing" x3-4 weeks now and also has an occ cough. Denies any CP.   Second opinion and referral to the interstitial lung disease program.  Referred by both Dr. Christinia Gully and Dr. 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  Patient presents with himself and his wife.  According to his history he has had cough of insidious onset for at least 3 years.  On and off and improved with prednisone.  Most recently in September 2018 when seen by Dr. Christinia Gully Protonix and Pepcid have helped the cough immensely.  Mostly dry cough  but does awaken him at night.  Occasionally he has phlegm.  Then it appears in July 2018 he went on a hike to the mountains with his wife and he could not keep up with her and he did worse than usual and got extremely short of breath.  After this in August 2018 he developed arthralgias and fatigue.  Was initially seen by the primary care physician and diagnosed as pneumonia and treated with doxycycline.  There was some concern about [facility] spotted fever because of antibodies.  But patient was seen by Dr. Michel Bickers and reassured this was not [facility] spotted fever.  Patient was then subject to antibody testing and apparently at this time the MPO antibody and P-ANCA antibody were positive.  By this time he developed myalgias he had a 15-20 pound weight loss he also started developing sensory peripheral neuropathy in September/October 2018.  He tells me all the symptoms are very symmetric.  He then saw Dr. Christinia Gully in September 2018 was given PPI and H2 blockade and his cough improved.  He was then referred to Dr. Amil Amen rheumatologist who based on the constellation of symptoms given a diagnosis of vasculitis [retrospectively his [place] vasculitis activity score is 9].  Patient at all points denied any mononeuritis multiplex or other CNS lesions of vasculitis, abdominal pain.  Review office chart both with Dr. Amil Amen and I will location urine analysis was normal without any casts or hematuria or proteinuria.  Creatinine is always been normal.  Respiratory wise he is not had any hemoptysis or infiltrates [he only has interstitial lung disease].  Denies any cardiac issues although echo is not been done yet.  Denies any mucous membrane involvement or serositis.  Denies any sinus issues or hearing loss issues and denies any skin lesions and denies any fevers.,  Given the clinical diagnosis of vasculitis patient was then subjected to Rituxan treatment [by this time he was already on some amount  of prednisone according to his history].  He got induction Rituxan therapy once a week with the last dose being July 04, 2017.  Since then he has been on prednisone and Bactrim for PCP prophylaxis.  His prednisone is now been tapered to 25 mg/day.  Further taper of prednisone is in the works.  However in the 6-8 weeks since the Rituxan last dose he is generally feeling fatigued.  His neuropathy persists [he has upcoming neurology appointment in a few months with Dr. Patel] in the also feels short of breath.  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  His main current review of symptoms is that of fatigue, bilateral peripheral neuropathy inability to stand and work.  In fact his peripheral neuropathy is so bad that he could not feel his vibrating cell phone near his feet.  At this point in time he is denying any weight loss.  In fact he is gained weight after the prednisone.  In need dysphagia.  He denies any dry eyes.  Denies any rash.  Denies any photophobia.  Denies any bruising.  Denies any hand ulcers, mouth ulcers chest pain or arthralgia.  Other parts of the interstitial lung disease questionnaire -Personal exposure history: Between 58 in 1975 he inhaled some marijuana.  He smokes cigarettes from the age of 28 in the year of 26 until he was 67 years old.  Quit in 1970. -Family history: Denies any family history of lung diseases such as COPD, asthma, sarcoidosis, cystic fibrosis, pulmonary fibrosis and hypersensitivity pneumonitis. -Home exposure history: He lives in a home construct in 1952.  He uses the same mattress for over 30 years.  He has used feathered pillows for ever which means like for many years including over a decade.  Recently has been using a sauna to feel better. 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  Specifically he denied using any medications for hypothyroidism, hydralazine, carbimazole.  He denied using any amiodarone, BCG, nitrofurantoin.  Currently he is on chronic prednisone and sulfonamides.  Autoimmune panel Results for ABASS, MISENER (MRN [id number]) as of 08/26/2017 14:01  Ref. Range 05/21/2017 17:01 05/21/2017 17:03 05/21/2017 17:04 05/26/2017 09:01  Anit Nuclear Antibody(ANA) Latest Ref Range: NEGATIVE     NEGATIVE  Cyclic Citrullin Peptide Ab Latest Units: UNITS   <16   ds DNA Ab Latest Units: IU/mL   <1   GBM Ab Latest Units: AI    <1.0  Ku Autoabs Latest Ref Range: Not detect   Not detected    Myeloperoxidase Abs Latest Units: AI   25.7 (H)   Serine Protease 3 Latest Units: AI   <1.0   C3 Complement Latest Ref Range: 82 - 185 mg/dL    126  C4 Complement Latest Ref Range: 15 - 53 mg/dL    23  Scleroderma (Scl-70) (ENA) Antibody, IgG Latest Ref Range: <1.0 NEG AI <1.0 NEG     Results for ZAK, GONDEK (MRN [id number]) as of 08/26/2017 14:01  Ref. Range 05/21/2017 16:56 05/21/2017 16:59 05/21/2017 17:03 05/21/2017 17:04  ANTI PM/SCL 100 AB (EIA) Latest Ref Range: <20 Units <20     EJ Autoabs Latest Ref Range: Not detect    Not detected   Jo-1 Autoabs Latest Ref Range: <1.0 NEG AI  <1.0 NEG    Mi-2 Autoabs Latest Ref Range: Not detect    Not detected   OJ Autoabs Latest Ref Range: Not detect    Not detected   P-ANCA Titer Latest Ref Range: <1:20 titer    1:320 (H)  PL-12 Autoabs Latest Ref Range: Not detect    Not detected   PL-7 Autoabs Latest Ref Range: Not detect    Not detected   SRP Autoabs Latest Ref Range: Not detect    Not detected  Results for MARSHAL, ESKEW (MRN [id number]) as of 08/26/2017 14:01  Ref. Range 04/24/2017 08:34 06/05/2017 10:33  Sed Rate Latest Ref Range: 0 -  20 mm/hr 57 (H) 53 (H)  Results for TYREKE, KAESER (MRN [id number]) as of 08/26/2017 14:01  Ref. Range 05/26/2017 09:04  Bacteria, UA Latest Ref Range: NONE SEEN /HPF NONE SEEN  Hyaline Cast Latest Ref Range: NONE SEEN /LPF NONE SEEN  RBC / HPF Latest Ref Range: 0 - 2 /HPF 0-2  Squamous Epithelial / LPF Latest Ref Range: < OR = 5 /HPF NONE SEEN  WBC, UA Latest Ref Range: 0 - 5 /HPF NONE SEEN    High-resolution CT chest done on August 21, 2017: I had a special interpretation of this done by Dr. Lorin Picket thoracic radiology who conferred with Dr. Vinnie Langton also thoracic radiology.  I also personally visualized this film.  The CT fits in American thoracic Society 2018 criteria of probable UIP which means is bilateral bibasilar subpleural disease with craniocaudal gradient with the basal area having more disease and there is traction bronchiectasis and reticulation.  There is no overt honeycombing.  The only immediate prior CT scan is in September 2018 and it was a non-high-resolution CT chest but according to the radiologist patient might have had early ILD even back in 2015 on a chest x-ray and appears to have progressed since then.  Review of Systems  Constitutional: Positive for unexpected weight change. Negative for fever.  HENT: Negative for congestion, dental problem, ear pain, nosebleeds, postnasal drip, rhinorrhea, sinus pressure, sneezing, sore throat and trouble swallowing.   Eyes: Negative for redness and itching.  Respiratory: Positive for cough, shortness of breath and wheezing. Negative for chest tightness.   Cardiovascular: Negative for palpitations and leg swelling.  Gastrointestinal: Negative for nausea and vomiting.  Genitourinary: Negative for dysuria.  Musculoskeletal: Negative for joint swelling.  Skin: Negative for rash.  Allergic/Immunologic: Negative.  Negative for environmental allergies, food allergies and immunocompromised state.  Neurological: Negative for  headaches.  Hematological: Bruises/bleeds easily.  Psychiatric/Behavioral: Negative for dysphoric mood. The patient is not nervous/anxious.        Objective:   Physical Exam  Vitals:   08/26/17 1343  BP: (!) 150/90  Pulse: 83  SpO2: 100%    Estimated body mass index is 34.77 kg/m as calculated from the following:   Height as of 06/05/17: 6' (1.829 m).   Weight as of 06/05/17: 256 lb 6.4 oz (116.3 kg).       Assessment & Plan:     ICD-10-CM   1. IPF (idiopathic pulmonary fibrosis) (HCC) J84.112 Pulse oximetry, overnight    Pulmonary function test  2. ILD (interstitial lung disease) ([place]) J84.9   3. Shortness of breath R06.02 ECHOCARDIOGRAM COMPLETE  4. Immunosuppressed status ([place]) D89.9   5. Other fatigue R53.83   6. Other contact with other birds, initial encounter W61.99XA     There are multiple issues here.  He does have interstitial lung disease but appears to have progressed.  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  UIP pathology is characteristic for a high risk of progression over time in an unpredictable fashion but also differential diagnosis of idiopathic primary fibrosis [IPF] and sometimes chronic hypersensitivity pneumonitis and rheumatoid arthritis interstitial lung disease and occasionally ANCA positive ILD which currently is considered IPF but most IPF clinicians.  Between these 3: The most likely differential diagnosis  is IPF,  And chronic hypersensitivity pneumonitis .  He has features of IPF because of male gender, age over 3, remote smoking, no specific etiology found.  Therefore the overwhelming odds is for this.  Many times these patients do have positive antibodies and low titers.ANCA it is unclear in the literature what this means and the current interstitial lung disease experts treat these patients as IPF.  Though recently there are some reports that these patients do progress to develop  vasculitis over time.  However in this particular patient he seems to have developed some features of vasculitis such as the myalgias, weight loss although he did not seem to have renal or sinus involvement. I do not think he has chronic hypersensitivity pneumonitis because of the basilar predominance of the disease on the CT chest but on occasion this is possible. Therefore in an ideal world he would require surgical lung biopsy to sort vasculitis versus IPF/UIP versus chronic hypersensitivity pneumonitis but at this point time he is fatigued and is also immunosuppressed with low B-cell population and therefore I want to avoid it  So my plan is to do: Bronchoscopy with lavage to rule out any opportunistic infections and look for cell count to help narrow the differential diagnosis.  I would also commit him to anti-fibrotic therapy for IPF giving him the clinical diagnosis of IPF; so his odds of disease stabilizationa re enhanced (discussed esbriet v ofev in detail). I would also get some overnight oxygen study and echocardiogram to complete the workup.  I have asked him to get rid of his feathered pillow so that the antigenic exposure can be removed. Meanwhile we will let the vasculitis management play out and at some point in time and is more stable we can entertain surgical lung biopsy.  I am okay with prednisone taper.  Other pillas of mmgmt such as rehab, weight loss, trials and support group at later  2h total time - of which 90 min face to face counseling   Dr. Brand Males, M.D., [facility].C.P Pulmonary and Critical Care Medicine Staff Physician, [place] Director - Interstitial Lung Disease  Program  Pulmonary [place] at [place], [place], [address]  Pager: [phone], If no answer or between  15:00h - 7:00h: call [phone] Telephone: [phone]

## 2017-08-27 ENCOUNTER — Telehealth (HOSPITAL_COMMUNITY): Payer: Self-pay | Admitting: Internal Medicine

## 2017-08-27 NOTE — Telephone Encounter (Signed)
User: Cherie Dark A Date/time: 08/27/17 9:21 AM  Comment: Called pt back and lmsg for him to CB to r/s echo appt to Thursday.  Context:  Outcome: Left Message  Phone number: [phone] Phone Type: Mobile  Comm. type: Telephone Call type: Outgoing  Contact: [place], Trinna Post Relation to patient: Self

## 2017-08-28 DIAGNOSIS — J8410 Pulmonary fibrosis, unspecified: Secondary | ICD-10-CM | POA: Diagnosis not present

## 2017-08-28 DIAGNOSIS — M317 Microscopic polyangiitis: Secondary | ICD-10-CM | POA: Diagnosis not present

## 2017-08-31 ENCOUNTER — Telehealth: Payer: Self-pay | Admitting: Internal Medicine

## 2017-08-31 NOTE — Telephone Encounter (Signed)
MR please contact Dr. Amil Amen regarding this patient.  Thanks!

## 2017-09-01 ENCOUNTER — Other Ambulatory Visit (HOSPITAL_COMMUNITY): Payer: Medicare Other

## 2017-09-02 ENCOUNTER — Telehealth: Payer: Self-pay | Admitting: Internal Medicine

## 2017-09-02 NOTE — Progress Notes (Signed)
[facility] Clinic Note - Initial Visit   Date: 09/03/17  Alex Atkinson MRN: [id number] DOB: 1951/06/12   Dear Dr. Amil Amen:  Thank you for your kind referral of Alex Atkinson for consultation of progressive peripheral neuropathy. Although his history is well known to you, please allow [place] to reiterate it for the purpose of our medical record. The patient was accompanied to the clinic by daughter, Alex Atkinson, who also provides collateral information.     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    Patient was in his usual state of health until several years ago and then developed a chronic cough.  In July 2018, he was hiking in [place], [place] and was unable to keep up with his wife while hiking because of fatigue and shortness of breath.  By August, he was having headaches, generalizes muscle aches, and joint pain.  Initially, there was treated for community acquired pneumonia as well as [facility] spotted fever, but this diagnosis was later retracted by [name], Infectious Disease who he saw in October.  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 CT chest showed ground glass attenuation.    By October, he had lost 20lb and started having tingling of the feet and numbness.  He has constant tingling/numbness over the toes, soles, and extending into the dorsum of the feet, worse on the left.  He has intermittent sharp pain and burning sensation. He denies any weakness, imbalance, or falls.  He tends to be very careful when walking because tactile stimuli can trigger tingling  pain of the feet.  On one occasion, he was unable to locate his phone which was ultimately found to be in his shoe on vibrate, but he could not feel it.  There is no severe nerve pain that preceded these symptoms.  He takes gabapentin '300mg'$  at bedtime which helps.  He also has very rare spells of numbness over the fingertips.  He denies any weakness of the hands.   He was evaluated by Dr. Amil Amen who diagnosed him with microscopic polyangiitis.  He completed weekly Rituxan treatments in November 2018 and remains of tapering dose of prednisone '40mg'$  with plans for '5mg'$  taper every 2 weeks.  His treatment has helped generalized myalgias and malaise.   Despite this, he continues to feel that his breathing and neuropathy has not stabilized, and continue to progress.     He denies any new headaches, vision changes, facial paresthesias, or dysphagia/dysarthria.    He drinks alcohol only occasionally.  No family history of neuropathy or personal history of diabetes.   Out-side paper records, electronic medical record, and images have been reviewed where available and summarized as:  Labs 05/21/2017:  p-ANCA 1:320*, MPO antibody 25.7*, aldolase 4.5, dsDNA neg, ANA neg, C3/4 neg,   CT chest 08/21/2017: 1. 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 Findings are technically indeterminate for usual interstitial pneumonia (UIP), although this is not strongly favored given the absence of a clear basilar gradient, the absence of frank honeycombing and the previously noted perilobular consolidation. Recommend a follow-up high-resolution chest CT  in 12 months to assess ongoing temporal pattern stability.  2. Stable subpleural 8 mm right middle lobe pulmonary nodule, for which 3 month stability has been demonstrated, probably benign. This nodule can be reassessed on follow-up chest CT in 12  months. 3. Two vessel coronary atherosclerosis.  Past Medical History:  Diagnosis Date  . Diverticulosis of colon 2005   Dr Fuller Plan  . Hemorrhoid 09/06/14   developed ? hemorrhoid  . Mononucleosis 1973  . PVC's (premature ventricular contractions)    caffeine induced; evaluated by Dr Aldona Bar    Past Surgical History:  Procedure Laterality Date  . atrthroscopy knee  1983 & 2007   R  . CHOLECYSTECTOMY N/A 07/11/2014   Procedure: LAPAROSCOPIC CHOLECYSTECTOMY ;  Surgeon: Ralene Ok, MD;  Location: [facility];  Service: General;  Laterality: N/A;  . colonoscopy with polypectomy  2005   polypoid colonic mucosa; Dr Fuller Plan  . ENDOSCOPIC RETROGRADE CHOLANGIOPANCREATOGRAPHY (ERCP) WITH PROPOFOL N/A 07/12/2014   Procedure: ENDOSCOPIC RETROGRADE CHOLANGIOPANCREATOGRAPHY (ERCP) WITH PROPOFOL;  Surgeon: Inda Castle, MD;  Location: [facility];  Service: Endoscopy;  Laterality: N/A;  . KNEE ARTHROSCOPY  1992   L  . Microscopic polyangiitis  2018  . TONSILLECTOMY AND ADENOIDECTOMY       Medications:  Outpatient Encounter Medications as of 09/03/2017  Medication Sig  . B Complex Vitamins (VITAMIN-B COMPLEX PO) Take 1 tablet by mouth daily.  . calcium citrate-vitamin D (CITRACAL+D) 315-200 MG-UNIT tablet Take 1 tablet by mouth 2 (two) times daily.  . famotidine (PEPCID) 20 MG tablet One at bedtime (Patient taking differently: Take 20 mg by mouth at bedtime. )  . gabapentin (NEURONTIN) 300 MG capsule Take 300 mg by mouth at bedtime.   . pantoprazole (PROTONIX) 40 MG tablet Take 30- 60 min before your first and last meals of the day (Patient taking differently: Take 40 mg by mouth daily before breakfast. Take 30- 60 min before your first and last meals of the day)  . predniSONE (DELTASONE) 10 MG tablet Take 4 tablets (40 mg total) by mouth daily with breakfast. (Patient taking differently: Take 25 mg by mouth daily with breakfast. 2.5 tablets.)  . sulfamethoxazole-trimethoprim (BACTRIM  DS,SEPTRA DS) 800-160 MG tablet Take 1 tablet by mouth every Tuesday, Thursday, and Saturday at 6 PM. In the morning.  . Turmeric 500 MG TABS Take 1,000 mg by mouth at bedtime.   . traMADol (ULTRAM) 50 MG tablet 1-2 every 4 hours as needed for cough or pain (Patient not taking: Reported on 08/26/2017)   No facility-administered encounter medications on file as of 09/03/2017.      Allergies: No Known Allergies  Family History: Family History  Problem Relation Age of Onset  . Lung cancer Father        cns metastases  . Dementia Mother   . Stroke Maternal Grandmother 87  . Leukemia Daughter        CML  . Diabetes Neg Hx   . Heart disease Neg Hx   . Colon cancer Neg Hx     Social History: Social History   Tobacco Use  . Smoking status: Never Smoker  . Smokeless tobacco: Never Used  Substance Use Topics  . Alcohol use: Yes    Alcohol/week: 0.0 oz    Comment: weekends occasional per pt.  . Drug use: No   Social History   Social History Narrative   Lives with wife in a one story home.  Has 3 children.     Works  as a Animal nutritionist.     Education: Vet school.      Review of Systems:  CONSTITUTIONAL: No fevers, chills, night sweats, or weight loss.   EYES: No visual changes or eye pain ENT: No hearing changes.  No history of nose bleeds.   RESPIRATORY: +cough, wheezing +shortness of breath.   CARDIOVASCULAR: Negative for chest pain, and palpitations.   GI: Negative for abdominal discomfort, blood in stools or black stools.  No recent change in bowel habits.   GU:  No history of incontinence.   MUSCLOSKELETAL: No history of joint pain or swelling.  No myalgias.   SKIN: Negative for lesions, rash, and itching.   HEMATOLOGY/ONCOLOGY: Negative for prolonged bleeding, bruising easily, and swollen nodes.  No history of cancer.   ENDOCRINE: Negative for cold or heat intolerance, polydipsia or goiter.   PSYCH:  No depression or anxiety symptoms.   NEURO: As Above.   Vital Signs:   BP (!) 150/84   Pulse 84   Ht 6' (1.829 m)   Wt 271 lb 6 oz (123.1 kg)   SpO2 95%   BMI 36.81 kg/m    General Medical Exam:   General:  Well appearing, comfortable.   Eyes/ENT: see cranial nerve examination.   Neck: No masses appreciated.  Full range of motion without tenderness.  No carotid bruits. Respiratory:  Clear to auscultation, good air entry bilaterally.   Cardiac:  Regular rate and rhythm, no murmur.   Extremities:  No deformities, edema, or skin discoloration.  Skin:  No rashes or lesions.  Neurological Exam: MENTAL STATUS including orientation to time, place, person, recent and remote memory, attention span and concentration, language, and fund of knowledge is normal.  Speech is not dysarthric.  CRANIAL NERVES: II:  No visual field defects.  Unremarkable fundi.   III-IV-VI: Pupils equal round and reactive to light.  Normal conjugate, extra-ocular eye movements in all directions of gaze.  No nystagmus.  No ptosis.   V:  Normal facial sensation.     VII:  Normal facial symmetry and movements.  No pathologic facial reflexes.  VIII:  Normal hearing and vestibular function.   IX-X:  Normal palatal movement.   XI:  Normal shoulder shrug and head rotation.   XII:  Normal tongue strength and range of motion, no deviation or fasciculation.  MOTOR:  No atrophy, fasciculations or abnormal movements.  No pronator drift.  Tone is normal.    Right Upper Extremity:    Left Upper Extremity:    Deltoid  5/5   Deltoid  5/5   Biceps  5/5   Biceps  5/5   Triceps  5/5   Triceps  5/5   Wrist extensors  5/5   Wrist extensors  5/5   Wrist flexors  5/5   Wrist flexors  5/5   Finger extensors  5/5   Finger extensors  5/5   Finger flexors  5/5   Finger flexors  5/5   Dorsal interossei  5/5   Dorsal interossei  5/5   Abductor pollicis  5/5   Abductor pollicis  5/5   Tone (Ashworth scale)  0  Tone (Ashworth scale)  0   Right Lower Extremity:    Left Lower Extremity:    Hip flexors  5/5    Hip flexors  5/5   Hip extensors  5/5   Hip extensors  5/5   Knee flexors  5/5   Knee flexors  5/5   Knee extensors  5/5  Knee extensors  5/5   Dorsiflexors  5/5   Dorsiflexors  5/5   Plantarflexors  5/5   Plantarflexors  5/5   Toe extensors  5/5   Toe extensors  5/5   Toe flexors  5-/5   Toe flexors  4+/5   Tone (Ashworth scale)  0  Tone (Ashworth scale)  0   MSRs:  Right                                                                 Left brachioradialis 2+  brachioradialis 2+  biceps 2+  biceps 2+  triceps 2+  triceps 2+  patellar 2+  patellar 2+  ankle jerk 1+  ankle jerk 0  Hoffman no  Hoffman no  plantar response down  plantar response down   SENSORY:  Vibration is 100% at the elbow and knees, reduced to 90% at the hands, 60% at the ankles, and absent at the toes.  There is a gradient pattern of sensory loss to temperature and vibration distal to the ankles. Proprioception is impaired on the left foot only.  Rhomberg sign is mildly positive.  COORDINATION/GAIT: Normal finger-to- nose-finger.  Intact rapid alternating movements bilaterally.  Able to rise from a chair without using arms.  Gait narrow based and stable. There is mild unsteadiness with tandem and stressed gait, but he is able to perform it.    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  He has completed 4 weekly treatments of Rituxan in November and now is on a tapering dose of prednisone currently at '40mg'$ /d.  Despite these treatments, he denies any improvement or stabilization of his neuropathy or shortness of breath.  His exam shows sensory > motor length-dependent peripheral neuropathy.  History and exam does not suggestive mononeuritis multiplex, as this presents with severe nerve pain due to nerve infarct.  Further, there are no findings of CNS vasculitis.   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  To better characterize the nature of his neuropathy and assess with prognosis, NCS/EMG will be ordered of the left side.  Based on these results and if his neuropathy continues to progress, despite optimal treatment of his vasculitis, nerve biopsy can be considered.  To be complete, I will check labs for other treatable causes of neuropathy as noted below.  PLAN/RECOMMENDATIONS:  1.  NCS/EMG of the left arm and leg 2.  Check vitamin B12, vitamin B1, folate, copper, SPEP with IFE, TSH 3.  Consider nerve biopsy, only if neuropathy progresses  4.  Continue gabapentin '300mg'$  at bedtime for paresthesias, this can be further titrated as needed 5.  Continue immunotherapy as per rheumatology   The duration of this appointment visit was 60 minutes of face-to-face time with the patient.  Greater than 50% of this time was spent in counseling, explanation of diagnosis, planning of further management, and coordination of care.   Thank you for allowing me to participate in patient's care.  If I can answer any additional questions, I would be pleased to do so.  Sincerely,    Donika K. Posey Pronto, DO

## 2017-09-02 NOTE — Telephone Encounter (Signed)
/  w Dr Amil Amen yesterday. Will Rx as IPF. Will see if DR Posey Pronto can do nerve biopsyt

## 2017-09-02 NOTE — Telephone Encounter (Signed)
PA has been started via [email or web address]. Key: [id number] Will check on status later on today.

## 2017-09-03 ENCOUNTER — Telehealth: Payer: Self-pay | Admitting: Internal Medicine

## 2017-09-03 ENCOUNTER — Ambulatory Visit (HOSPITAL_COMMUNITY): Payer: Medicare Other | Attending: Internal Medicine

## 2017-09-03 ENCOUNTER — Ambulatory Visit (INDEPENDENT_AMBULATORY_CARE_PROVIDER_SITE_OTHER): Payer: Medicare Other | Admitting: Neurology

## 2017-09-03 ENCOUNTER — Other Ambulatory Visit: Payer: Self-pay

## 2017-09-03 ENCOUNTER — Other Ambulatory Visit (INDEPENDENT_AMBULATORY_CARE_PROVIDER_SITE_OTHER): Payer: Medicare Other

## 2017-09-03 ENCOUNTER — Encounter: Payer: Self-pay | Admitting: Neurology

## 2017-09-03 VITALS — BP 150/84 | HR 84 | Ht 72.0 in | Wt 271.4 lb

## 2017-09-03 DIAGNOSIS — G609 Hereditary and idiopathic neuropathy, unspecified: Secondary | ICD-10-CM | POA: Diagnosis not present

## 2017-09-03 DIAGNOSIS — G619 Inflammatory polyneuropathy, unspecified: Secondary | ICD-10-CM | POA: Diagnosis not present

## 2017-09-03 DIAGNOSIS — R200 Anesthesia of skin: Secondary | ICD-10-CM | POA: Diagnosis not present

## 2017-09-03 DIAGNOSIS — R0602 Shortness of breath: Secondary | ICD-10-CM

## 2017-09-03 DIAGNOSIS — M317 Microscopic polyangiitis: Secondary | ICD-10-CM

## 2017-09-03 DIAGNOSIS — I420 Dilated cardiomyopathy: Secondary | ICD-10-CM | POA: Insufficient documentation

## 2017-09-03 DIAGNOSIS — I5030 Unspecified diastolic (congestive) heart failure: Secondary | ICD-10-CM | POA: Insufficient documentation

## 2017-09-03 LAB — TSH: TSH: 2.43 u[IU]/mL (ref 0.35–4.50)

## 2017-09-03 LAB — VITAMIN B12: VITAMIN B 12: 412 pg/mL (ref 211–911)

## 2017-09-03 LAB — FOLATE: Folate: >23.6 ng/mL (ref >5.9)

## 2017-09-03 NOTE — Telephone Encounter (Signed)
Will route message to MR to make him aware. 

## 2017-09-03 NOTE — Patient Instructions (Signed)
1.  NCS/EMG of the left arm and leg 2.  Check labs 3.  Continue gabapentin 300mg  at bedtime

## 2017-09-03 NOTE — Telephone Encounter (Signed)
PA has been approved for Esbriet through 09/02/2018.   Left detailed message on Magie's named vm at [facility].  Nothing further needed.

## 2017-09-03 NOTE — Telephone Encounter (Signed)
Discussed case with Dr. Chase Caller on the phone.  Patient is seeking 2nd opinion at [facility] for his diagnosis tomorrow.  He will be having NCS/EMG to characterize the nature of his neuropathy and help guide prognosis.  At this time, recommend continued treatment as outlined by rheumatology. We can consider nerve biopsy, only if needed.  See clinic note for detailed summary.  Alex Stubblefield K. Posey Pronto, DO

## 2017-09-03 NOTE — Telephone Encounter (Signed)
Hi Donnika  You saw Alex Atkinson 09/03/2017 . Would be nice to know if that neuropathy is vasculitis related thoguh after Rituxan not sure if will show up. If biopsy is a consideration (nerve, muscle) let me know. Also, please let me know your thoughts. My cell [phone]  Thanks  Dr. Brand Males, M.D., [facility].C.P Pulmonary and Critical Care Medicine Staff Physician, [place] Director - Interstitial Lung Disease  Program  Pulmonary [place] at [place], [place], [address]  Pager: [phone], If no answer or between  15:00h - 7:00h: call [phone] Telephone: [phone]

## 2017-09-04 ENCOUNTER — Ambulatory Visit (HOSPITAL_COMMUNITY): Admit: 2017-09-04 | Payer: Medicare Other | Admitting: Internal Medicine

## 2017-09-04 ENCOUNTER — Encounter (HOSPITAL_COMMUNITY): Payer: Medicare Other

## 2017-09-04 ENCOUNTER — Encounter (HOSPITAL_COMMUNITY): Payer: Self-pay

## 2017-09-04 DIAGNOSIS — I776 Arteritis, unspecified: Secondary | ICD-10-CM | POA: Diagnosis not present

## 2017-09-04 DIAGNOSIS — E669 Obesity, unspecified: Secondary | ICD-10-CM | POA: Diagnosis not present

## 2017-09-04 DIAGNOSIS — Z79899 Other long term (current) drug therapy: Secondary | ICD-10-CM | POA: Diagnosis not present

## 2017-09-04 DIAGNOSIS — R0600 Dyspnea, unspecified: Secondary | ICD-10-CM | POA: Diagnosis not present

## 2017-09-04 DIAGNOSIS — Z6837 Body mass index (BMI) 37.0-37.9, adult: Secondary | ICD-10-CM | POA: Diagnosis not present

## 2017-09-04 DIAGNOSIS — Z792 Long term (current) use of antibiotics: Secondary | ICD-10-CM | POA: Diagnosis not present

## 2017-09-04 DIAGNOSIS — M317 Microscopic polyangiitis: Secondary | ICD-10-CM | POA: Diagnosis not present

## 2017-09-04 DIAGNOSIS — J849 Interstitial pulmonary disease, unspecified: Secondary | ICD-10-CM | POA: Diagnosis not present

## 2017-09-04 DIAGNOSIS — K219 Gastro-esophageal reflux disease without esophagitis: Secondary | ICD-10-CM | POA: Diagnosis not present

## 2017-09-04 DIAGNOSIS — Z7952 Long term (current) use of systemic steroids: Secondary | ICD-10-CM | POA: Diagnosis not present

## 2017-09-04 DIAGNOSIS — G629 Polyneuropathy, unspecified: Secondary | ICD-10-CM | POA: Diagnosis not present

## 2017-09-04 DIAGNOSIS — Z6836 Body mass index (BMI) 36.0-36.9, adult: Secondary | ICD-10-CM | POA: Diagnosis not present

## 2017-09-04 DIAGNOSIS — R768 Other specified abnormal immunological findings in serum: Secondary | ICD-10-CM | POA: Diagnosis not present

## 2017-09-04 SURGERY — VIDEO BRONCHOSCOPY WITHOUT FLUORO
Anesthesia: Moderate Sedation | Laterality: Bilateral

## 2017-09-07 ENCOUNTER — Encounter: Payer: Self-pay | Admitting: Internal Medicine

## 2017-09-08 LAB — PROTEIN ELECTROPHORESIS, SERUM
ALBUMIN ELP: 4.2 g/dL (ref 3.8–4.8)
Alpha 1: 0.3 g/dL (ref 0.2–0.3)
Alpha 2: 1 g/dL — ABNORMAL HIGH (ref 0.5–0.9)
Beta 2: 0.4 g/dL (ref 0.2–0.5)
Beta Globulin: 0.5 g/dL (ref 0.4–0.6)
Gamma Globulin: 0.8 g/dL (ref 0.8–1.7)
Total Protein: 7.2 g/dL (ref 6.1–8.1)

## 2017-09-08 LAB — IMMUNOFIXATION ELECTROPHORESIS
IGG (IMMUNOGLOBIN G), SERUM: 847 mg/dL (ref 694–1618)
IMMUNOGLOBULIN A: 247 mg/dL (ref 81–463)
IgM, Serum: 77 mg/dL (ref 48–271)

## 2017-09-08 LAB — COPPER, SERUM: COPPER: 103 ug/dL (ref 70–175)

## 2017-09-08 LAB — VITAMIN B1: VITAMIN B1 (THIAMINE): 13 nmol/L (ref 8–30)

## 2017-09-09 NOTE — Telephone Encounter (Signed)
Please call daughter Raquel Sarna and  [place] tthe following  1. I did speak to Dr Roxan Hockey and get an update 2. I fully support going to [facility] 3. . Respect them canceling bronch 4. Question: does she want me to call the [facility]? If so I can. Alternatively, you can give her my cone email and cell phone to stay in touch with me and/or give it to the [facility] so they can call 5. Do they want to keep the feb 2019 followup or cancel altogether? 6. Do they want [place] to proceed with esbriet or hold off on that?  Thanks  Dr. Brand Males, M.D., [facility].C.P Pulmonary and Critical Care Medicine Staff Physician, [place] Director - Interstitial Lung Disease  Program  Pulmonary [place] at [place], [place], [address]  Pager: [phone], If no answer or between  15:00h - 7:00h: call [phone] Telephone: [phone]

## 2017-09-10 ENCOUNTER — Telehealth: Payer: Self-pay | Admitting: *Deleted

## 2017-09-10 NOTE — Telephone Encounter (Signed)
-----   Message from Cameron Sprang, MD sent at 09/08/2017 11:28 AM EST ----- Pls let him know that there is only one more blood test results pending, the B1, but so far all of them were normal except for one test which showed mild elevation in the production of his body proteins. When we see these results, Dr. Posey Pronto usually refers to Hematology for further evaluation. Can you pls send referral, thanks!

## 2017-09-10 NOTE — Telephone Encounter (Signed)
Patient has been given results and further instructions but he would rather talk to Dr. Posey Pronto at his return visit on Tuesday.

## 2017-09-10 NOTE — Telephone Encounter (Signed)
Left message for patient to call me back. 

## 2017-09-15 ENCOUNTER — Ambulatory Visit (INDEPENDENT_AMBULATORY_CARE_PROVIDER_SITE_OTHER): Payer: Medicare Other | Admitting: Neurology

## 2017-09-15 DIAGNOSIS — M317 Microscopic polyangiitis: Secondary | ICD-10-CM

## 2017-09-15 DIAGNOSIS — G619 Inflammatory polyneuropathy, unspecified: Secondary | ICD-10-CM

## 2017-09-15 NOTE — Telephone Encounter (Signed)
From: [name]  Sent: 09/15/2017 10:22 AM EST  To: Brand Males, MD Subject: RE: Visit Follow-Up Question  Carmelina Noun,  Thank you this message.We appreciate the support & well wishes for my Dad.Of course Dr. Chase Caller may reach out to Dr. Posey Pronto at [facility] if he feels that would be helpful.Dr. Serita Grit office number is [phone].For now, the Esbriet is on hold, Dr. Chase Caller is welcome to discuss that with Dr. Posey Pronto should he reach out.  Thank you,Dr. Chase Caller for sharing your contact info - we hope to be able to share a positive progress note sometime in the future.Our hope is to get his condition to a place of management & at that point work with providers at [facility] as well as locally; we are grateful for your office being open to working with my Dad in the future if needed.  We did cancel the 2/8 appointment and PFTs.I have linked my Dad's Olivia Lopez de Gutierrez accounts; Dr. Chase Caller is welcome to review his PFT results & visit notes from 1/18 at [facility]; these will be repeated on 3/1.   MR please advise. Thanks.

## 2017-09-15 NOTE — Progress Notes (Signed)
    Follow-up Visit   Date: 09/15/17    JAHFARI AMBERS MRN: [id number] DOB: November 01, 1950   Interim History: SEELEY HISSONG is a 67 y.o. man with recently diagnosed microscopic polyangiitis referred for evaluation of progressive bilateral feet paresthesias, who is here for electrodiagnostic testing of the legs.  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  There are no signs of neuropathy or entrapment neuropathy in the left upper extremity.   Labs 61/17/2019: Vitamin B12 412, copper 103, folate >23.6, SPEP with IFE no M protein, TSH 2.43, vitamin B1 13   The above results was discussed with patient in detail. There are no signs of active denervation or demyelinating findings. Given the severity of his sensory loss, he will most likely have permanent deficits with respect to sensation. However, with continued treatment of underlying microscopic polyangiitis, there will be stability if not improvement of his neuropathy.  Continue gabapentin 300 mg at bedtime. All questions were answered.  Return to clinic in 4 months, or sooner as needed.  Greater than 90% of this 15 minute visit was spent in counseling, explanation of diagnosis, planning of further management, and coordination of care (excluding procedure time).   Thank you for allowing me to participate in patient's care.  If I can answer any additional questions, I would be pleased to do so.    Sincerely,    Mishawn Hemann K. Posey Pronto, DO

## 2017-09-15 NOTE — Telephone Encounter (Signed)
Please advise [name] family  1. Have reviewed [facility] notes and fully support their findings and Rx plan with [employer]cept 2. Respect cancelling esbriet 3. Respect cancelling pft and followup 4. Available anytime they need my resource if they so desire 5. Best wishes for speedy recovery   Dr. Brand Males, M.D., [facility].C.P Pulmonary and Critical Care Medicine Staff Physician, [place] Director - Interstitial Lung Disease  Program  Pulmonary [place] at [place], [place], [address]  Pager: [phone], If no answer or between  15:00h - 7:00h: call [phone] Telephone: [phone]

## 2017-09-15 NOTE — Procedures (Signed)
[facility]  [place], [place]  [place], [place] [address] Tel: [phone] Fax:  [phone] Test Date:  09/15/2017  Patient: Alex Atkinson DOB: March 27, 1951 Physician: Narda Amber, DO  Sex: Male Height: 6\' 0"  Ref Phys: Narda Amber, DO  ID#: [id number] Temp: 34.6C Technician:    Patient Complaints: This is a 67 year old man with newly diagnosed microscopic polyangiitis referred for evaluation of progressive bilateral feet paresthesias.   NCV & EMG Findings: Extensive electrodiagnostic testing of the left upper and lower extremity shows: 1. Left median, ulnar, and mixed palmer sensory responses are within normal limits. Left median and ulnar motor responses are within normal limits. 2. Left sural and superficial peroneal sensory responses are absent. Left peroneal (EDB) and tibial motor responses are absent. Left peroneal motor response at the tibialis anterior is within normal limits. 3. Left tibial H reflex study shows prolonged latency. 4. There is no active or chronic motor axon loss changes affecting any of the tested muscles in the left upper extremity. Motor unit configuration and recruitment pattern is within normal limits. 5. In the left lower extremity, chronic motor axon loss changes are isolated to the muscles below the knee, including tibialis anterior, flexor digitorum longus, and medial gastrocnemius muscles. There is no evidence of accompanied active denervation.  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  There is no evidence of carpal tunnel syndrome, sensorimotor polyneuropathy, or cervical radiculopathy in the left upper extremity.    ___________________________ Narda Amber, DO    Nerve Conduction Studies Anti Sensory Summary Table   Site NR Peak (ms) Norm Peak (ms) P-T Amp (V) Norm P-T Amp  Left Median Anti  Sensory (2nd Digit)  34.6C  Wrist    3.6 <3.8 16.2 >10  Left Sup Peroneal Anti Sensory (Ant Lat Mall)  34.6C  12 cm NR  <4.6  >3  Left Sural Anti Sensory (Lat Mall)  34.6C  Calf NR  <4.6  >3  Left Ulnar Anti Sensory (5th Digit)  34.6C  Wrist    3.1 <3.2 16.0 >5   Motor Summary Table   Site NR Onset (ms) Norm Onset (ms) O-P Amp (mV) Norm O-P Amp Site1 Site2 Delta-0 (ms) Dist (cm) Vel (m/s) Norm Vel (m/s)  Left Median Motor (Abd Poll Brev)  34.6C  Wrist    3.7 <4.0 7.7 >5 Elbow Wrist 5.4 32.0 59 >50  Elbow    9.1  7.0         Left Peroneal Motor (Ext Dig Brev)  34.6C  Ankle NR  <6.0  >2.5 B Fib Ankle  0.0  >40  B Fib NR     Poplt B Fib  0.0  >40  Poplt NR            Left Peroneal TA Motor (Tib Ant)  34.6C  Fib Head    3.9 <4.5 4.0 >3 Poplit Fib Head 1.8 10.0 56 >40  Poplit    5.7  3.7         Left Tibial Motor (Abd Hall Brev)  34.6C  Ankle NR  <6.0  >4 Knee Ankle  0.0  >40  Knee NR            Left Ulnar Motor (Abd Dig Minimi)  34.6C  Wrist    2.9 <3.1 8.6 >7 B Elbow Wrist 4.2 27.0 64 >50  B Elbow    7.1  8.3  A  Elbow B Elbow 1.7 10.0 59 >50  A Elbow    8.8  8.3          Comparison Summary Table   Site NR Peak (ms) Norm Peak (ms) P-T Amp (V) Site1 Site2 Delta-P (ms) Norm Delta (ms)  Left Median/Ulnar Palm Comparison (Wrist - 8cm)  34.6C  Median Palm    1.9 <2.2 26.9 Median Palm Ulnar Palm 0.1   Ulnar Palm    1.8 <2.2 12.6       H Reflex Studies   NR H-Lat (ms) Lat Norm (ms) L-R H-Lat (ms)  Left Tibial (Gastroc)  34.6C     40.00 <35    EMG   Side Muscle Ins Act Fibs Psw Fasc Number Recrt Dur Dur. Amp Amp. Poly Poly. Comment  Left 1stDorInt Nml Nml Nml Nml Nml Nml Nml Nml Nml Nml Nml Nml N/A  Left AntTibialis Nml Nml Nml Nml 1- Rapid Some 1+ Some 1+ Nml Nml N/A  Left Biceps Nml Nml Nml Nml Nml Nml Nml Nml Nml Nml Nml Nml N/A  Left BicepsFemS Nml Nml Nml Nml Nml Nml Nml Nml Nml Nml Nml Nml N/A  Left Deltoid Nml Nml Nml Nml Nml Nml Nml Nml Nml Nml Nml Nml N/A    Left Ext Indicis Nml Nml Nml Nml Nml Nml Nml Nml Nml Nml Nml Nml N/A  Left Flex Dig Long Nml Nml Nml Nml 2- Rapid Some 1+ Some 1+ Nml Nml N/A  Left Gastroc Nml Nml Nml Nml 1- Rapid Some 1+ Some 1+ Nml Nml N/A  Left GluteusMed Nml Nml Nml Nml Nml Nml Nml Nml Nml Nml Nml Nml N/A  Left PronatorTeres Nml Nml Nml Nml Nml Nml Nml Nml Nml Nml Nml Nml N/A  Left RectFemoris Nml Nml Nml Nml Nml Nml Nml Nml Nml Nml Nml Nml N/A  Left Triceps Nml Nml Nml Nml Nml Nml Nml Nml Nml Nml Nml Nml N/A      Waveforms:

## 2017-09-16 ENCOUNTER — Telehealth: Payer: Self-pay | Admitting: Internal Medicine

## 2017-09-16 NOTE — Telephone Encounter (Signed)
MR, here is information from [place], Dr. Susie Cassette daughter that I wanted you to know as an [place].  Thank you this message.We appreciate the support & well wishes for my Dad.Of course Dr. Chase Caller may reach out to Dr. Posey Pronto at [facility] if he feels that would be helpful.Dr. Serita Grit office number is [phone].For now, the [facility] is on hold, Dr. Chase Caller is welcome to discuss that with Dr. Posey Pronto should he reach out.  Thank you,Dr. Chase Caller for sharing your contact info - we hope to be able to share a positive progress note sometime in the future.Our hope is to get his condition to a place of management & at that point work with providers at [facility] as well as locally; we are grateful for your office being open to working with my Dad in the future if needed.  We did cancel the 2/8 appointment and PFTs.I have linked my Dad's [facility] accounts; Dr. Chase Caller is welcome to review his PFT results & visit notes from 1/18 at [facility]; these will be repeated on 3/1

## 2017-09-16 NOTE — Telephone Encounter (Signed)
It appears that pt has been made aware of echo results.  lmtcb to see if anything further is needed.    Notes recorded by Eula Listen, RN on 09/16/2017 at 2:59 PM EST Pt called and given results and recommendations. Pt verbalized understanding and all questions answered. Nothing further needed.   ------    Notes recorded by Eula Listen, RN on 09/16/2017 at 9:02 AM EST LMT[facility] 1/30 ------  Notes recorded by Eula Listen, RN on 09/15/2017 at 2:07 PM EST [facility] 1/29 ------  Notes recorded by Brand Males, MD on 09/15/2017 at 1:47 PM EST essentually normal echo but for very mild stiffness in heart muscle  Study Conclusions  - Left ventricle: The cavity size was normal. Wall thickness was increased in a pattern of mild LVH. Systolic function was normal. The estimated ejection fraction was in the range of 55% to 60%. Wall motion was normal; there were no regional wall motion abnormalities. Doppler parameters are consistent with abnormal left ventricular relaxation (grade 1 diastolic dysfunction). - Aortic valve: There was trivial regurgitation. - Left atrium: The atrium was moderately dilated.

## 2017-09-17 ENCOUNTER — Telehealth (HOSPITAL_COMMUNITY): Payer: Self-pay

## 2017-09-17 NOTE — Telephone Encounter (Signed)
Portia called patient to schedule. Orientation is on 09/28/2017 at 9:15am. Patient will attend the 6:45am exc class.

## 2017-09-17 NOTE — Telephone Encounter (Signed)
Patients insurance is active through Medicare Part A & B - Patient is responsible for 20% co-insurance.   Patients insurance is active through [employer] at 100%.

## 2017-09-21 DIAGNOSIS — Z6837 Body mass index (BMI) 37.0-37.9, adult: Secondary | ICD-10-CM | POA: Diagnosis not present

## 2017-09-21 DIAGNOSIS — E669 Obesity, unspecified: Secondary | ICD-10-CM | POA: Diagnosis not present

## 2017-09-21 DIAGNOSIS — Z7952 Long term (current) use of systemic steroids: Secondary | ICD-10-CM | POA: Diagnosis not present

## 2017-09-21 DIAGNOSIS — M317 Microscopic polyangiitis: Secondary | ICD-10-CM | POA: Diagnosis not present

## 2017-09-21 DIAGNOSIS — G5793 Unspecified mononeuropathy of bilateral lower limbs: Secondary | ICD-10-CM | POA: Diagnosis not present

## 2017-09-21 DIAGNOSIS — M2550 Pain in unspecified joint: Secondary | ICD-10-CM | POA: Diagnosis not present

## 2017-09-25 ENCOUNTER — Ambulatory Visit: Payer: Medicare Other | Admitting: Internal Medicine

## 2017-09-28 ENCOUNTER — Encounter (HOSPITAL_COMMUNITY)
Admission: RE | Admit: 2017-09-28 | Discharge: 2017-09-28 | Disposition: A | Discharge status: Home / Self Care | Payer: Medicare Other | Source: Ambulatory Visit | Attending: Pulmonary Disease | Admitting: Pulmonary Disease

## 2017-09-28 ENCOUNTER — Ambulatory Visit (HOSPITAL_COMMUNITY): Payer: Medicare Other

## 2017-09-28 ENCOUNTER — Encounter (HOSPITAL_COMMUNITY): Payer: Self-pay

## 2017-09-28 VITALS — BP 143/83 | HR 70 | Resp 18 | Ht 70.5 in | Wt 273.6 lb

## 2017-09-28 DIAGNOSIS — J8410 Pulmonary fibrosis, unspecified: Secondary | ICD-10-CM | POA: Diagnosis not present

## 2017-09-28 DIAGNOSIS — R0602 Shortness of breath: Secondary | ICD-10-CM | POA: Diagnosis not present

## 2017-09-28 NOTE — Progress Notes (Signed)
Alex Atkinson 67 y.o. male Pulmonary Rehab Orientation Note Patient arrived today in Cardiac and Pul[facility] Rehab for orientation to Pulmonary Rehab. He was transported from [employer] via wheel chair. He does not carry portable oxygen. Per pt, he has not been prescribed oxygen for home use. Color good, skin warm and dry. Patient is oriented to time and place. Patient's medical history, psychosocial health, and medications reviewed. Psychosocial assessment reveals pt lives with their spouse. 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 Pt hobbies include woodworking and gardening. Pt reports his stress level is low. Areas of stress/anxiety include Health. Pt does not exhibit signs of depression. PHQ2/9 score 0/na. Pt shows good  coping skills with positive outlook. 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 He is offered emotional support and reassurance. Will continue to monitor and evaluate progress toward psychosocial goal(s) of remaining positive about his ability to loose weight and make needed changes to prepare for possible lung transplant. Physical assessment reveals heart rate is normal. Course crackles noted in lungs bilat from mid to lower. Grip strength equal, strong. Distal pulses palpable. Patient reports he does take medications as prescribed. Patient states he follows a Regular diet. The patient reports no specific efforts to gain or lose weight. He will begin healthy eating changes to begin his weight loss for transplant. Patient's weight will be monitored closely. Demonstration and practice of PLB using pulse oximeter. Patient able to return demonstration satisfactorily. Safety and hand hygiene in the exercise area reviewed with patient. Patient voices understanding of the information reviewed. Department expectations discussed with patient and achievable goals were set. The patient shows enthusiasm about attending the program  and we look forward to working with this nice gentleman. The patient is scheduled for a 6 min walk test on 09/29/17 and to begin exercise on 10/06/17 at 6:45.   45 minutes was spent on a variety of activities such as assessment of the patient, obtaining baseline data including height, weight, BMI, and grip strength, verifying medical history, allergies, and current medications, and teaching patient strategies for performing tasks with less respiratory effort with emphasis on pursed lip breathing.

## 2017-09-29 ENCOUNTER — Encounter (HOSPITAL_COMMUNITY)
Admission: RE | Admit: 2017-09-29 | Discharge: 2017-09-29 | Disposition: A | Discharge status: Home / Self Care | Payer: Medicare Other | Source: Ambulatory Visit | Attending: Pulmonary Disease | Admitting: Pulmonary Disease

## 2017-09-29 DIAGNOSIS — J8410 Pulmonary fibrosis, unspecified: Secondary | ICD-10-CM

## 2017-09-30 ENCOUNTER — Encounter (HOSPITAL_COMMUNITY): Payer: Self-pay | Admitting: *Deleted

## 2017-10-01 NOTE — Progress Notes (Signed)
Pulmonary Individual Treatment Plan  Patient Details  Name: Alex Atkinson MRN: [id number] Date of Birth: [birth year] Referring Provider:     Pulmonary Rehab Walk Test from 09/29/2017 in [place]  Referring Provider  Dr. Nelda Marseille      Initial Encounter Date:    Pulmonary Rehab Walk Test from 09/29/2017 in [place]  Date  10/01/17  Referring Provider  Dr. Nelda Marseille      Visit Diagnosis: Pulmonary fibrosis ([place])  Patient's Home Medications on Admission:   Current Outpatient Medications:  .  B Complex Vitamins (VITAMIN-B COMPLEX PO), Take 1 tablet by mouth daily., Disp: , Rfl:  .  calcium citrate-vitamin D (CITRACAL+D) 315-200 MG-UNIT tablet, Take 1 tablet by mouth 2 (two) times daily., Disp: , Rfl:  .  famotidine (PEPCID) 20 MG tablet, One at bedtime (Patient taking differently: Take 20 mg by mouth at bedtime. ), Disp: 30 tablet, Rfl: 11 .  gabapentin (NEURONTIN) 300 MG capsule, Take 300 mg by mouth at bedtime. , Disp: , Rfl:  .  mycophenolate (CELLCEPT) 500 MG tablet, Take 1,000 mg by mouth., Disp: , Rfl:  .  pantoprazole (PROTONIX) 40 MG tablet, Take 30- 60 min before your first and last meals of the day (Patient taking differently: Take 40 mg by mouth daily before breakfast. Take 30- 60 min before your first and last meals of the day), Disp: 60 tablet, Rfl: 2 .  predniSONE (DELTASONE) 10 MG tablet, Take 4 tablets (40 mg total) by mouth daily with breakfast. (Patient taking differently: Take 25 mg by mouth daily with breakfast. 2.5 tablets.), Disp: 120 tablet, Rfl: 1 .  sulfamethoxazole-trimethoprim (BACTRIM DS,SEPTRA DS) 800-160 MG tablet, Take 1 tablet by mouth every Tuesday, Thursday, and Saturday at 6 PM. In the morning., Disp: , Rfl:  .  traMADol (ULTRAM) 50 MG tablet, 1-2 every 4 hours as needed for cough or pain, Disp: 40 tablet, Rfl: 0 .  Turmeric 500 MG TABS, Take 1,000 mg by mouth at bedtime. , Disp: , Rfl:    Past Medical History: Past Medical History:  Diagnosis Date  . Diverticulosis of colon 2005   Dr Fuller Plan  . Hemorrhoid 09/06/14   developed ? hemorrhoid  . Mononucleosis 1973  . PVC's (premature ventricular contractions)    caffeine induced; evaluated by Dr Aldona Bar    Tobacco Use: Social History   Tobacco Use  Smoking Status Never Smoker  Smokeless Tobacco Never Used    Labs: Recent Review Flowsheet Data    Labs for ITP Cardiac and Pulmonary Rehab Latest Ref Rng & Units 05/09/2011 07/23/2012 07/26/2012   Cholestrol 0 - 200 mg/dL 204(H) 198 -   LDLCALC 0 - 99 mg/dL - 138(H) -   LDLDIRECT mg/dL 144.8 - -   HDL >39.00 mg/dL 41.00 35.30(L) -   Trlycerides 0.0 - 149.0 mg/dL 114.0 125.0 -   Hemoglobin A1c 4.6 - 6.5 % - - 5.5      Capillary Blood Glucose: No results found for: GLUCAP   Pulmonary Assessment Scores: Pulmonary Assessment Scores    Row Name 09/30/17 1422 10/01/17 0738       ADL UCSD   ADL Phase  Entry  Entry    SOB Score total  15  -      CAT Score   CAT Score  5 Entry  -      mMRC Score   mMRC Score  -  1  Pulmonary Function Assessment: Pulmonary Function Assessment - 09/28/17 1111      Breath   Bilateral Breath Sounds  Other    Other  course crackles mid to lower lobes bilat    Shortness of Breath  Limiting activity;Yes       Exercise Target Goals: Date: 10/01/17  Exercise Program Goal: Individual exercise prescription set using results from initial 6 min walk test and THRR while considering  patient's activity barriers and safety.    Exercise Prescription Goal: Initial exercise prescription builds to 30-45 minutes a day of aerobic activity, 2-3 days per week.  Home exercise guidelines will be given to patient during program as part of exercise prescription that the participant will acknowledge.  Activity Barriers & Risk Stratification:   6 Minute Walk: 6 Minute Walk    Row Name 10/01/17 0739         6 Minute Walk   Phase   Initial     Distance  1700 feet     Walk Time  6 minutes     # of Rest Breaks  0     MPH  3.21     METS  3.45     RPE  13     Perceived Dyspnea   1.5     Symptoms  No     Resting HR  86 bpm     Resting BP  128/90     Resting Oxygen Saturation   94 %     Exercise Oxygen Saturation  during 6 min walk  86 %     Max Ex. HR  110 bpm     Max Ex. BP  180/88     2 Minute Post BP  140/92       Interval HR   1 Minute HR  98     2 Minute HR  108     3 Minute HR  111     4 Minute HR  108     5 Minute HR  108     6 Minute HR  110     2 Minute Post HR  92     Interval Heart Rate?  Yes       Interval Oxygen   Interval Oxygen?  Yes     Baseline Oxygen Saturation %  94 %     1 Minute Oxygen Saturation %  93 %     1 Minute Liters of Oxygen  0 L     2 Minute Oxygen Saturation %  88 %     2 Minute Liters of Oxygen  0 L     3 Minute Oxygen Saturation %  87 %     3 Minute Liters of Oxygen  0 L     4 Minute Oxygen Saturation %  87 %     4 Minute Liters of Oxygen  0 L     5 Minute Oxygen Saturation %  86 %     5 Minute Liters of Oxygen  0 L     6 Minute Oxygen Saturation %  87 %     6 Minute Liters of Oxygen  0 L     2 Minute Post Oxygen Saturation %  94 %     2 Minute Post Liters of Oxygen  0 L        Oxygen Initial Assessment: Oxygen Initial Assessment - 10/01/17 0737      Initial 6 min Walk   Oxygen Used  None      Program Oxygen Prescription   Program Oxygen Prescription  None Patient's lowest saturation was 86% during 33mwt-will see how he does on first day and re-evaluate oxygen needs       Oxygen Re-Evaluation:   Oxygen Discharge (Final Oxygen Re-Evaluation):   Initial Exercise Prescription: Initial Exercise Prescription - 10/01/17 0700      Date of Initial Exercise RX and Referring Provider   Date  10/01/17    Referring Provider  Dr. Nelda Marseille      Treadmill   MPH  1.7    Grade  0    Minutes  17      Bike   Level  0.8    Minutes  17      Rower   Level  2     Watts  30    Minutes  17      Prescription Details   Frequency (times per week)  2    Duration  Progress to 45 minutes of aerobic exercise without signs/symptoms of physical distress      Intensity   THRR 40-80% of Max Heartrate  62-123    Ratings of Perceived Exertion  11-13    Perceived Dyspnea  0-4      Progression   Progression  Continue to progress workloads to maintain intensity without signs/symptoms of physical distress.      Resistance Training   Training Prescription  Yes    Weight  8    Reps  10-15       Perform Capillary Blood Glucose checks as needed.  Exercise Prescription Changes:   Exercise Comments:   Exercise Goals and Review: Exercise Goals    Row Name 09/28/17 1030             Exercise Goals   Increase Physical Activity  Yes       Intervention  Provide advice, education, support and counseling about physical activity/exercise needs.;Develop an individualized exercise prescription for aerobic and resistive training based on initial evaluation findings, risk stratification, comorbidities and participant's personal goals.       Expected Outcomes  Short Term: Attend rehab on a regular basis to increase amount of physical activity.       Increase Strength and Stamina  Yes       Intervention  Provide advice, education, support and counseling about physical activity/exercise needs.;Develop an individualized exercise prescription for aerobic and resistive training based on initial evaluation findings, risk stratification, comorbidities and participant's personal goals.       Expected Outcomes  Short Term: Increase workloads from initial exercise prescription for resistance, speed, and METs.       Able to understand and use rate of perceived exertion (RPE) scale  Yes       Intervention  Provide education and explanation on how to use RPE scale       Expected Outcomes  Short Term: Able to use RPE daily in rehab to express subjective intensity level;Long  Term:  Able to use RPE to guide intensity level when exercising independently       Able to understand and use Dyspnea scale  Yes       Intervention  Provide education and explanation on how to use Dyspnea scale       Expected Outcomes  Short Term: Able to use Dyspnea scale daily in rehab to express subjective sense of shortness of breath during exertion;Long Term: Able to use Dyspnea scale to guide intensity level when exercising  independently       Knowledge and understanding of Target Heart Rate Range (THRR)  Yes       Intervention  Provide education and explanation of THRR including how the numbers were predicted and where they are located for reference       Expected Outcomes  Short Term: Able to state/look up THRR;Long Term: Able to use THRR to govern intensity when exercising independently;Short Term: Able to use daily as guideline for intensity in rehab       Understanding of Exercise Prescription  Yes       Intervention  Provide education, explanation, and written materials on patient's individual exercise prescription       Expected Outcomes  Short Term: Able to explain program exercise prescription;Long Term: Able to explain home exercise prescription to exercise independently          Exercise Goals Re-Evaluation :   Discharge Exercise Prescription (Final Exercise Prescription Changes):   Nutrition:  Target Goals: Understanding of nutrition guidelines, daily intake of sodium 1500mg , cholesterol 200mg , calories 30% from fat and 7% or less from saturated fats, daily to have 5 or more servings of fruits and vegetables.  Biometrics: Pre Biometrics - 09/28/17 1135      Pre Biometrics   Grip Strength  50 kg        Nutrition Therapy Plan and Nutrition Goals:   Nutrition Assessments:   Nutrition Goals Re-Evaluation:   Nutrition Goals Discharge (Final Nutrition Goals Re-Evaluation):   Psychosocial: Target Goals: Acknowledge presence or absence of significant  depression and/or stress, maximize coping skills, provide positive support system. Participant is able to verbalize types and ability to use techniques and skills needed for reducing stress and depression.  Initial Review & Psychosocial Screening: Initial Psych Review & Screening - 09/28/17 1117      Initial Review   Current issues with  None Identified      Family Dynamics   Good Support System?  Yes      Barriers   Psychosocial barriers to participate in program  There are no identifiable barriers or psychosocial needs.;The patient should benefit from training in stress management and relaxation.      Screening Interventions   Interventions  Encouraged to exercise       Quality of Life Scores:  Scores of 19 and below usually indicate a poorer quality of life in these areas.  A difference of  2-3 points is a clinically meaningful difference.  A difference of 2-3 points in the total score of the Quality of Life Index has been associated with significant improvement in overall quality of life, self-image, physical symptoms, and general health in studies assessing change in quality of life.   PHQ-9: Recent Review Flowsheet Data    Depression screen [facility] 2/9 09/28/2017 05/21/2017   Decreased Interest 0 0   Down, Depressed, Hopeless 0 0   PHQ - 2 Score 0 0     Interpretation of Total Score  Total Score Depression Severity:  1-4 = Minimal depression, 5-9 = Mild depression, 10-14 = Moderate depression, 15-19 = Moderately severe depression, 20-27 = Severe depression   Psychosocial Evaluation and Intervention:   Psychosocial Re-Evaluation: Psychosocial Re-Evaluation    [place] Name 09/28/17 1120             Psychosocial Re-Evaluation   Current issues with  None Identified       Expected Outcomes  patient will remain free from psychosocial barriers to participation in pulmonary rehab  Continue Psychosocial Services   Follow up required by staff          Psychosocial  Discharge (Final Psychosocial Re-Evaluation): Psychosocial Re-Evaluation - 09/28/17 1120      Psychosocial Re-Evaluation   Current issues with  None Identified    Expected Outcomes  patient will remain free from psychosocial barriers to participation in pulmonary rehab    Continue Psychosocial Services   Follow up required by staff       Education: Education Goals: Education classes will be provided on a weekly basis, covering required topics. Participant will state understanding/return demonstration of topics presented.  Learning Barriers/Preferences: Learning Barriers/Preferences - 09/28/17 1110      Learning Barriers/Preferences   Learning Barriers  None    Learning Preferences  Written Material;Computer/Internet;Group Instruction       Education Topics: Risk Factor Reduction:  -Group instruction that is supported by a PowerPoint presentation. Instructor discusses the definition of a risk factor, different risk factors for pulmonary disease, and how the heart and lungs work together.     Nutrition for Pulmonary Patient:  -Group instruction provided by PowerPoint slides, verbal discussion, and written materials to support subject matter. The instructor gives an explanation and review of healthy diet recommendations, which includes a discussion on weight management, recommendations for fruit and vegetable consumption, as well as protein, fluid, caffeine, fiber, sodium, sugar, and alcohol. Tips for eating when patients are short of breath are discussed.   Pursed Lip Breathing:  -Group instruction that is supported by demonstration and informational handouts. Instructor discusses the benefits of pursed lip and diaphragmatic breathing and detailed demonstration on how to preform both.     Oxygen Safety:  -Group instruction provided by PowerPoint, verbal discussion, and written material to support subject matter. There is an overview of "What is Oxygen" and "Why do we need it".   Instructor also reviews how to create a safe environment for oxygen use, the importance of using oxygen as prescribed, and the risks of noncompliance. There is a brief discussion on traveling with oxygen and resources the patient may utilize.   Oxygen Equipment:  -Group instruction provided by [facility] Staff utilizing handouts, written materials, and equipment demonstrations.   Signs and Symptoms:  -Group instruction provided by written material and verbal discussion to support subject matter. Warning signs and symptoms of infection, stroke, and heart attack are reviewed and when to call the physician/911 reinforced. Tips for preventing the spread of infection discussed.   Advanced Directives:  -Group instruction provided by verbal instruction and written material to support subject matter. Instructor reviews Advanced Directive laws and proper instruction for filling out document.   Pulmonary Video:  -Group video education that reviews the importance of medication and oxygen compliance, exercise, good nutrition, pulmonary hygiene, and pursed lip and diaphragmatic breathing for the pulmonary patient.   Exercise for the Pulmonary Patient:  -Group instruction that is supported by a PowerPoint presentation. Instructor discusses benefits of exercise, core components of exercise, frequency, duration, and intensity of an exercise routine, importance of utilizing pulse oximetry during exercise, safety while exercising, and options of places to exercise outside of rehab.     Pulmonary Medications:  -Verbally interactive group education provided by instructor with focus on inhaled medications and proper administration.   Anatomy and Physiology of the Respiratory System and Intimacy:  -Group instruction provided by PowerPoint, verbal discussion, and written material to support subject matter. Instructor reviews respiratory cycle and anatomical components of the respiratory system and their  functions. Instructor also reviews differences in obstructive and restrictive respiratory diseases with examples of each. Intimacy, Sex, and Sexuality differences are reviewed with a discussion on how relationships can change when diagnosed with pulmonary disease. Common sexual concerns are reviewed.   MD DAY -A group question and answer session with a medical doctor that allows participants to ask questions that relate to their pulmonary disease state.   OTHER EDUCATION -Group or individual verbal, written, or video instructions that support the educational goals of the pulmonary rehab program.   Holiday Eating Survival Tips:  -Group instruction provided by PowerPoint slides, verbal discussion, and written materials to support subject matter. The instructor gives patients tips, tricks, and techniques to help them not only survive but enjoy the holidays despite the onslaught of food that accompanies the holidays.   Knowledge Questionnaire Score: Knowledge Questionnaire Score - 09/30/17 1422      Knowledge Questionnaire Score   Pre Score  16/18       Core Components/Risk Factors/Patient Goals at Admission: Personal Goals and Risk Factors at Admission - 09/28/17 1117      Core Components/Risk Factors/Patient Goals on Admission    Weight Management  Yes;Obesity    Intervention  Obesity: Provide education and appropriate resources to help participant work on and attain dietary goals.;Weight Management/Obesity: Establish reasonable short term and long term weight goals.;Weight Management: Provide education and appropriate resources to help participant work on and attain dietary goals.;Weight Management: Develop a combined nutrition and exercise program designed to reach desired caloric intake, while maintaining appropriate intake of nutrient and fiber, sodium and fats, and appropriate energy expenditure required for the weight goal.    Expected Outcomes  Understanding of distribution of  calorie intake throughout the day with the consumption of 4-5 meals/snacks;Understanding recommendations for meals to include 15-35% energy as protein, 25-35% energy from fat, 35-60% energy from carbohydrates, less than 200mg  of dietary cholesterol, 20-35 gm of total fiber daily;Weight Loss: Understanding of general recommendations for a balanced deficit meal plan, which promotes 1-2 lb weight loss per week and includes a negative energy balance of [phone] kcal/d    Improve shortness of breath with ADL's  Yes    Intervention  Provide education, individualized exercise plan and daily activity instruction to help decrease symptoms of SOB with activities of daily living.    Expected Outcomes  Short Term: Improve cardiorespiratory fitness to achieve a reduction of symptoms when performing ADLs;Long Term: Be able to perform more ADLs without symptoms or delay the onset of symptoms       Core Components/Risk Factors/Patient Goals Review:    Core Components/Risk Factors/Patient Goals at Discharge (Final Review):    ITP Comments:   Comments:

## 2017-10-06 ENCOUNTER — Encounter (HOSPITAL_COMMUNITY)
Admission: RE | Admit: 2017-10-06 | Discharge: 2017-10-06 | Disposition: A | Discharge status: Home / Self Care | Payer: Medicare Other | Source: Ambulatory Visit | Attending: Pulmonary Disease | Admitting: Pulmonary Disease

## 2017-10-06 VITALS — Wt 268.7 lb

## 2017-10-06 DIAGNOSIS — J8410 Pulmonary fibrosis, unspecified: Secondary | ICD-10-CM | POA: Diagnosis not present

## 2017-10-06 NOTE — Progress Notes (Deleted)
Daily Session Note  Patient Details  Name: Alex Atkinson MRN: [id number] Date of Birth: January 17, 1951 Referring Provider:     Pulmonary Rehab Walk Test from 09/29/2017 in [place]  Referring Provider  Dr. Nelda Marseille      Encounter Date: 10/06/2017  Check In: Session Check In - 10/06/17 0645      Check-In   Location  [facility]    Staff Present  Trish Fountain, RN, BSN;Molly diVincenzo, MS, ACSM RCEP, Exercise Physiologist    Supervising physician immediately available to respond to emergencies  [facility] immediately available    Physician(s)  Dr. Tyrell Antonio    Medication changes reported      No    Fall or balance concerns reported     No    Comments  neuropathy left foot/lack of feeling    Tobacco Cessation  No Change    Warm-up and Cool-down  Performed as group-led instruction    Resistance Training Performed  Yes    VAD Patient?  No      Pain Assessment   Currently in Pain?  No/denies    Multiple Pain Sites  No       Capillary Blood Glucose: No results found for this or any previous visit (from the past 24 hour(s)).  Exercise Prescription Changes - 10/06/17 0809      Response to Exercise   Blood Pressure (Admit)  146/90    Blood Pressure (Exercise)  172/100    Blood Pressure (Exit)  114/84    Heart Rate (Admit)  65 bpm    Heart Rate (Exercise)  120 bpm    Heart Rate (Exit)  98 bpm    Oxygen Saturation (Admit)  95 %    Oxygen Saturation (Exercise)  88 %    Oxygen Saturation (Exit)  94 %    Rating of Perceived Exertion (Exercise)  13    Perceived Dyspnea (Exercise)  1    Duration  Progress to 45 minutes of aerobic exercise without signs/symptoms of physical distress    Intensity  -- 40-80%HRR      Progression   Progression  Continue to progress workloads to maintain intensity without signs/symptoms of physical distress.      Resistance Training   Training Prescription  Yes    Weight  8    Reps  10-15    Time  0 Minutes      Treadmill   MPH  2.8    Grade  3    Minutes  17      Bike   Level  1.3    Minutes  17      Rower   Level  3    Watts  30    Minutes  17       Social History   Tobacco Use  Smoking Status Never Smoker  Smokeless Tobacco Never Used    Goals Met:  Exercise tolerated well Queuing for purse lip breathing No report of cardiac concerns or symptoms Strength training completed today  Goals Unmet:  Not Applicable  Comments: Service time is from 0645 to 0800. [phone] was time RN spent with patient while he was exercising on the rower demonstrating to patient pursed lip breathing and having patient return demonstration. RN worked with patient on PLB timing/rhythem with exertion. Patient able to effectively use PLB with coaching.    Dr. Rush Farmer is Medical Director for Pulmonary Rehab at [facility].

## 2017-10-08 ENCOUNTER — Encounter (HOSPITAL_COMMUNITY)
Admission: RE | Admit: 2017-10-08 | Discharge: 2017-10-08 | Disposition: A | Discharge status: Home / Self Care | Payer: Medicare Other | Source: Ambulatory Visit | Attending: Pulmonary Disease | Admitting: Pulmonary Disease

## 2017-10-08 ENCOUNTER — Telehealth (HOSPITAL_COMMUNITY): Payer: Self-pay | Admitting: *Deleted

## 2017-10-08 VITALS — Wt 265.2 lb

## 2017-10-08 DIAGNOSIS — J8410 Pulmonary fibrosis, unspecified: Secondary | ICD-10-CM

## 2017-10-08 NOTE — Progress Notes (Signed)
Daily Session Note  Patient Details  Name: Alex Atkinson MRN: [id number] Date of Birth: 11/29/1950 Referring Provider:     Pulmonary Rehab Walk Test from 09/29/2017 in [place]  Referring Provider  Dr. Nelda Marseille      Encounter Date: 10/08/2017  Check In: Session Check In - 10/08/17 0738      Check-In   Resistance Training Performed  No no resistance training performed       Capillary Blood Glucose: No results found for this or any previous visit (from the past 24 hour(s)).  Exercise Prescription Changes - 10/08/17 0800      Response to Exercise   Blood Pressure (Admit)  142/90    Blood Pressure (Exercise)  184/96    Blood Pressure (Exit)  118/84    Heart Rate (Admit)  90 bpm    Heart Rate (Exercise)  120 bpm    Heart Rate (Exit)  107 bpm    Oxygen Saturation (Admit)  91 %    Oxygen Saturation (Exercise)  87 %    Oxygen Saturation (Exit)  93 %    Rating of Perceived Exertion (Exercise)  13    Perceived Dyspnea (Exercise)  1    Duration  Progress to 45 minutes of aerobic exercise without signs/symptoms of physical distress    Intensity  -- 40-80%HRR      Progression   Progression  Continue to progress workloads to maintain intensity without signs/symptoms of physical distress.      Treadmill   MPH  2.8 decreased to 2.5 due to BP    Grade  3    Minutes  17      Bike   Level  1.3    Minutes  17      Rower   Level  3    Watts  41    Minutes  17       Social History   Tobacco Use  Smoking Status Never Smoker  Smokeless Tobacco Never Used    Goals Met:  Exercise tolerated well No report of cardiac concerns or symptoms Strength training completed today  Goals Unmet:  Not Applicable  Comments: Service time is from 6:45a to 8:00a    Dr. Rush Farmer is Medical Director for Pulmonary Rehab at [facility].

## 2017-10-09 NOTE — Progress Notes (Signed)
DELWIN RACZKOWSKI 67 y.o. male  DOB: 1951/07/09 MRN: [id number]           Nutrition Brief Note 1. Pulmonary fibrosis ([place])    Past Medical History:  Diagnosis Date  . Diverticulosis of colon 2005   Dr Fuller Plan  . Hemorrhoid 09/06/14   developed ? hemorrhoid  . Mononucleosis 1973  . PVC's (premature ventricular contractions)    caffeine induced; evaluated by Dr Aldona Bar   Meds reviewed. Deltasone noted  Ht: Ht Readings from Last 1 Encounters:  09/28/17 5' 10.5" (1.791 m)    Wt:  Wt Readings from Last 3 Encounters:  10/08/17 265 lb 3.4 oz (120.3 kg)  10/06/17 268 lb 11.9 oz (121.9 kg)  09/28/17 273 lb 9.5 oz (124.1 kg)    BMI: 36.8    Current tobacco use? No  Labs: no recent labs noted  Nutrition Diagnosis ? Food-and nutrition-related knowledge deficit related to lack of exposure to information as related to diagnosis of pulmonary disease ? Obesity related to excessive energy intake as evidenced by a BMI of 36.8  Goal(s) 1. Identify food quantities necessary to achieve wt loss of  -2# per week to a goal wt loss of 2.7-10.9 kg (6-24 lb) at graduation from pulmonary rehab.  Plan:  Pt to attend Pulmonary Nutrition class Will provide client-centered nutrition education as part of interdisciplinary care.   Monitor and evaluate progress toward nutrition goal with team.  Monitor and Evaluate progress toward nutrition goal with team.   Derek Mound, M.Ed, RD, LDN, CDE 10/09/2017 2:04 PM

## 2017-10-13 ENCOUNTER — Encounter (HOSPITAL_COMMUNITY)
Admission: RE | Admit: 2017-10-13 | Discharge: 2017-10-13 | Disposition: A | Discharge status: Home / Self Care | Payer: Medicare Other | Source: Ambulatory Visit | Attending: Pulmonary Disease | Admitting: Pulmonary Disease

## 2017-10-13 DIAGNOSIS — J8410 Pulmonary fibrosis, unspecified: Secondary | ICD-10-CM

## 2017-10-13 NOTE — Progress Notes (Signed)
Daily Session Note  Patient Details  Name: Alex Atkinson MRN: [id number] Date of Birth: [birth year] Referring Provider:     Pulmonary Rehab Walk Test from 09/29/2017 in [place]  Referring Provider  Dr. Nelda Marseille      Encounter Date: 10/13/2017  Check In: Session Check In - 10/13/17 0657      Check-In   Location  [facility]    Staff Present  Su Hilt, MS, ACSM RCEP, Exercise Physiologist;Mahika Vanvoorhis Rollene Rotunda, RN, BSN    Supervising physician immediately available to respond to emergencies  [facility] immediately available    Physician(s)  Dr. Horris Latino    Medication changes reported      No    Fall or balance concerns reported     No    Tobacco Cessation  No Change    Warm-up and Cool-down  Performed as group-led instruction    Resistance Training Performed  No    VAD Patient?  No      Pain Assessment   Currently in Pain?  No/denies    Multiple Pain Sites  No       Capillary Blood Glucose: No results found for this or any previous visit (from the past 24 hour(s)).  Exercise Prescription Changes - 10/13/17 0700      Home Exercise Plan   Plans to continue exercise at  [facility] (comment)    Frequency  Add 3 additional days to program exercise sessions.       Social History   Tobacco Use  Smoking Status Never Smoker  Smokeless Tobacco Never Used    Goals Met:  Exercise tolerated well Queuing for purse lip breathing No report of cardiac concerns or symptoms Strength training completed today  Goals Unmet:  BP Patients resting BP, treadmill, and rower BP much improved on amlodipine 64m. BP remains elevated on [employer] bike 162/96. Pt unable to maintain workload of 1.8. Sustained WL of 1.4. Will continue to monitor BP during exertion and report to MD as needed.  Comments: Service time is from 0645 to 056  Dr. WRush Farmeris Medical Director [facility]

## 2017-10-13 NOTE — Progress Notes (Signed)
I have reviewed a Home Exercise Prescription with [name] . Alex Atkinson is currently exercising at home.  The patient was advised to walk 3 days a week for 30 minutes.  [name] and I discussed how to progress their exercise prescription.  The patient stated that their goals were to lose 10 lbs at a time, be able to go golfing and climb stairs without shortness of breath.  The patient stated that they understand the exercise prescription.  We reviewed exercise guidelines, target heart rate during exercise, oxygen use, weather, home pulse oximeter, endpoints for exercise, and goals.  Patient is encouraged to come to me with any questions. I will continue to follow up with the patient to assist them with progression and safety.

## 2017-10-15 ENCOUNTER — Encounter (HOSPITAL_COMMUNITY)
Admission: RE | Admit: 2017-10-15 | Discharge: 2017-10-15 | Disposition: A | Discharge status: Home / Self Care | Payer: Medicare Other | Source: Ambulatory Visit | Attending: Pulmonary Disease | Admitting: Pulmonary Disease

## 2017-10-15 VITALS — Wt 266.1 lb

## 2017-10-15 DIAGNOSIS — J8410 Pulmonary fibrosis, unspecified: Secondary | ICD-10-CM | POA: Diagnosis not present

## 2017-10-15 NOTE — Progress Notes (Signed)
Daily Session Note  Patient Details  Name: JAIVYN GULLA MRN: [id number] Date of Birth: 05/25/1951 Referring Provider:     Pulmonary Rehab Walk Test from 09/29/2017 in [place]  Referring Provider  Dr. Nelda Marseille      Encounter Date: 10/15/2017  Check In: Session Check In - 10/15/17 0657      Check-In   Location  [facility]    Staff Present  Su Hilt, MS, ACSM RCEP, Exercise Physiologist;Khadeem Rockett Rollene Rotunda, RN, BSN    Supervising physician immediately available to respond to emergencies  [facility] immediately available    Physician(s)  Dr. Loleta Books    Medication changes reported      No    Fall or balance concerns reported     No    Tobacco Cessation  No Change    Warm-up and Cool-down  Performed as group-led instruction    Resistance Training Performed  Yes    VAD Patient?  No      Pain Assessment   Currently in Pain?  No/denies    Multiple Pain Sites  No       Capillary Blood Glucose: No results found for this or any previous visit (from the past 24 hour(s)).    Social History   Tobacco Use  Smoking Status Never Smoker  Smokeless Tobacco Never Used    Goals Met:  Independence with exercise equipment Using PLB without cueing & demonstrates good technique Exercise tolerated well No report of cardiac concerns or symptoms Strength training completed today  Goals Unmet:  O2 Sat, patient placed on O2 at 2lpm for O2 saturations of 86% on treadmill at last session.  Comments: Service time is from 0645 to 0800   Dr. Rush Farmer is Medical Director for Pulmonary Rehab at [facility].

## 2017-10-15 NOTE — Progress Notes (Signed)
Alex Atkinson 67 y.o. male  DOB: [birth year] MRN: [id number]           Nutrition Brief Note 1. Pulmonary fibrosis ([place])   Note Spoke with pt and pt's wife,[name]. Pt is obese. Pt eats 3 meals a day plus snacks; most prepared at home. Making healthy food choices the majority of the time. Pt wants to lose wt. Per discussion, pt has lost ~8 lb since the Super Bowl (~1 month ago). Rate of weight loss appears safe. Pt and pt wife educated re: 1800 kcal, anti-inflammatory, wt loss diet. Barriers to wt loss include pt likes to snack and junk food snacks are readily available at pt's office. Low calorie snack options discussed. Pt expressed understanding of the information reviewed via feedback method.      Nutrition Diagnosis ? Food-and nutrition-related knowledge deficit related to lack of exposure to information as related to diagnosis of pulmonary disease ? Obesity related to excessive energy intake as evidenced by a BMI of 36.8  Nutrition Intervention ? Pt's individual nutrition plan and goals reviewed with pt. ? Handouts given for: 1800 kcal, 5-day menu ideas, Anti-inflammatory foods ? Pt to attend the Nutrition and Lung Disease class  Goal(s) 1. Identify food quantities necessary to achieve wt loss of  -2# per week to a goal wt loss of 2.7-10.9 kg (6-24 lb) at graduation from pulmonary rehab.  Plan:  Pt to attend Pulmonary Nutrition class Will provide client-centered nutrition education as part of interdisciplinary care.   Monitor and evaluate progress toward nutrition goal with team.  Monitor and Evaluate progress toward nutrition goal with team.   Derek Mound, M.Ed, RD, LDN, CDE 10/15/2017 3:53 PM

## 2017-10-16 DIAGNOSIS — Z8489 Family history of other specified conditions: Secondary | ICD-10-CM | POA: Diagnosis not present

## 2017-10-16 DIAGNOSIS — Z808 Family history of malignant neoplasm of other organs or systems: Secondary | ICD-10-CM | POA: Diagnosis not present

## 2017-10-16 DIAGNOSIS — Z806 Family history of leukemia: Secondary | ICD-10-CM | POA: Diagnosis not present

## 2017-10-16 DIAGNOSIS — Z801 Family history of malignant neoplasm of trachea, bronchus and lung: Secondary | ICD-10-CM | POA: Diagnosis not present

## 2017-10-16 DIAGNOSIS — I10 Essential (primary) hypertension: Secondary | ICD-10-CM | POA: Diagnosis not present

## 2017-10-16 DIAGNOSIS — Z79899 Other long term (current) drug therapy: Secondary | ICD-10-CM | POA: Diagnosis not present

## 2017-10-16 DIAGNOSIS — J849 Interstitial pulmonary disease, unspecified: Secondary | ICD-10-CM | POA: Diagnosis not present

## 2017-10-16 DIAGNOSIS — G629 Polyneuropathy, unspecified: Secondary | ICD-10-CM | POA: Diagnosis not present

## 2017-10-16 DIAGNOSIS — Z7952 Long term (current) use of systemic steroids: Secondary | ICD-10-CM | POA: Diagnosis not present

## 2017-10-16 DIAGNOSIS — Z820 Family history of epilepsy and other diseases of the nervous system: Secondary | ICD-10-CM | POA: Diagnosis not present

## 2017-10-16 DIAGNOSIS — Z6836 Body mass index (BMI) 36.0-36.9, adult: Secondary | ICD-10-CM | POA: Diagnosis not present

## 2017-10-16 DIAGNOSIS — K219 Gastro-esophageal reflux disease without esophagitis: Secondary | ICD-10-CM | POA: Diagnosis not present

## 2017-10-16 DIAGNOSIS — I776 Arteritis, unspecified: Secondary | ICD-10-CM | POA: Diagnosis not present

## 2017-10-16 DIAGNOSIS — R0600 Dyspnea, unspecified: Secondary | ICD-10-CM | POA: Diagnosis not present

## 2017-10-16 DIAGNOSIS — E669 Obesity, unspecified: Secondary | ICD-10-CM | POA: Diagnosis not present

## 2017-10-16 DIAGNOSIS — G4736 Sleep related hypoventilation in conditions classified elsewhere: Secondary | ICD-10-CM | POA: Diagnosis not present

## 2017-10-20 ENCOUNTER — Encounter (HOSPITAL_COMMUNITY): Payer: Medicare Other

## 2017-10-20 NOTE — Progress Notes (Signed)
Daily Session Note  Patient Details  Name: Alex Atkinson MRN: [id number] Date of Birth: 03-05-1951 Referring Provider:     Pulmonary Rehab Walk Test from 09/29/2017 in [place]  Referring Provider  Dr. Nelda Marseille      Encounter Date: 10/15/2017  Check In:   Capillary Blood Glucose: No results found for this or any previous visit (from the past 24 hour(s)).    Social History   Tobacco Use  Smoking Status Never Smoker  Smokeless Tobacco Never Used    Goals Met:  Exercise tolerated well Strength training completed today  Goals Unmet:  Not Applicable  Comments: Service time is [id number]  to 0800    Dr. Rush Farmer is Medical Director for Pulmonary Rehab at [facility].

## 2017-10-22 ENCOUNTER — Encounter (HOSPITAL_COMMUNITY): Payer: Medicare Other

## 2017-10-22 DIAGNOSIS — Z23 Encounter for immunization: Secondary | ICD-10-CM | POA: Diagnosis not present

## 2017-10-22 DIAGNOSIS — J8410 Pulmonary fibrosis, unspecified: Secondary | ICD-10-CM | POA: Diagnosis not present

## 2017-10-26 NOTE — Progress Notes (Addendum)
Pulmonary Individual Treatment Plan  Patient Details  Name: Alex Atkinson MRN: [id number] Date of Birth: [birth year] Referring Provider:     Pulmonary Rehab Walk Test from 09/29/2017 in [place]  Referring Provider  Dr. Nelda Marseille      Initial Encounter Date:    Pulmonary Rehab Walk Test from 09/29/2017 in [place]  Date  10/01/17  Referring Provider  Dr. Nelda Marseille      Visit Diagnosis: Pulmonary fibrosis ([place])  Patient's Home Medications on Admission:   Current Outpatient Medications:  .  B Complex Vitamins (VITAMIN-B COMPLEX PO), Take 1 tablet by mouth daily., Disp: , Rfl:  .  calcium citrate-vitamin D (CITRACAL+D) 315-200 MG-UNIT tablet, Take 1 tablet by mouth 2 (two) times daily., Disp: , Rfl:  .  famotidine (PEPCID) 20 MG tablet, One at bedtime (Patient taking differently: Take 20 mg by mouth at bedtime. ), Disp: 30 tablet, Rfl: 11 .  gabapentin (NEURONTIN) 300 MG capsule, Take 300 mg by mouth at bedtime. , Disp: , Rfl:  .  mycophenolate (CELLCEPT) 500 MG tablet, Take 1,000 mg by mouth., Disp: , Rfl:  .  pantoprazole (PROTONIX) 40 MG tablet, Take 30- 60 min before your first and last meals of the day (Patient taking differently: Take 40 mg by mouth daily before breakfast. Take 30- 60 min before your first and last meals of the day), Disp: 60 tablet, Rfl: 2 .  predniSONE (DELTASONE) 10 MG tablet, Take 4 tablets (40 mg total) by mouth daily with breakfast. (Patient taking differently: Take 25 mg by mouth daily with breakfast. 2.5 tablets.), Disp: 120 tablet, Rfl: 1 .  sulfamethoxazole-trimethoprim (BACTRIM DS,SEPTRA DS) 800-160 MG tablet, Take 1 tablet by mouth every Tuesday, Thursday, and Saturday at 6 PM. In the morning., Disp: , Rfl:  .  traMADol (ULTRAM) 50 MG tablet, 1-2 every 4 hours as needed for cough or pain, Disp: 40 tablet, Rfl: 0 .  Turmeric 500 MG TABS, Take 1,000 mg by mouth at bedtime. , Disp: , Rfl:    Past Medical History: Past Medical History:  Diagnosis Date  . Diverticulosis of colon 2005   Dr Fuller Plan  . Hemorrhoid 09/06/14   developed ? hemorrhoid  . Mononucleosis 1973  . PVC's (premature ventricular contractions)    caffeine induced; evaluated by Dr Aldona Bar    Tobacco Use: Social History   Tobacco Use  Smoking Status Never Smoker  Smokeless Tobacco Never Used    Labs: Recent Review Flowsheet Data    Labs for ITP Cardiac and Pulmonary Rehab Latest Ref Rng & Units 05/09/2011 07/23/2012 07/26/2012   Cholestrol 0 - 200 mg/dL 204(H) 198 -   LDLCALC 0 - 99 mg/dL - 138(H) -   LDLDIRECT mg/dL 144.8 - -   HDL >39.00 mg/dL 41.00 35.30(L) -   Trlycerides 0.0 - 149.0 mg/dL 114.0 125.0 -   Hemoglobin A1c 4.6 - 6.5 % - - 5.5      Capillary Blood Glucose: No results found for: GLUCAP   Pulmonary Assessment Scores: Pulmonary Assessment Scores    Row Name 09/30/17 1422 10/01/17 0738       ADL UCSD   ADL Phase  Entry  Entry    SOB Score total  15  -      CAT Score   CAT Score  5 Entry  -      mMRC Score   mMRC Score  -  1  Pulmonary Function Assessment: Pulmonary Function Assessment - 09/28/17 1111      Breath   Bilateral Breath Sounds  Other    Other  course crackles mid to lower lobes bilat    Shortness of Breath  Limiting activity;Yes       Exercise Target Goals:    Exercise Program Goal: Individual exercise prescription set using results from initial 6 min walk test and THRR while considering  patient's activity barriers and safety.    Exercise Prescription Goal: Initial exercise prescription builds to 30-45 minutes a day of aerobic activity, 2-3 days per week.  Home exercise guidelines will be given to patient during program as part of exercise prescription that the participant will acknowledge.  Activity Barriers & Risk Stratification:   6 Minute Walk: 6 Minute Walk    Row Name 10/01/17 0739         6 Minute Walk   Phase  Initial      Distance  1700 feet     Walk Time  6 minutes     # of Rest Breaks  0     MPH  3.21     METS  3.45     RPE  13     Perceived Dyspnea   1.5     Symptoms  No     Resting HR  86 bpm     Resting BP  128/90     Resting Oxygen Saturation   94 %     Exercise Oxygen Saturation  during 6 min walk  86 %     Max Ex. HR  110 bpm     Max Ex. BP  180/88     2 Minute Post BP  140/92       Interval HR   1 Minute HR  98     2 Minute HR  108     3 Minute HR  111     4 Minute HR  108     5 Minute HR  108     6 Minute HR  110     2 Minute Post HR  92     Interval Heart Rate?  Yes       Interval Oxygen   Interval Oxygen?  Yes     Baseline Oxygen Saturation %  94 %     1 Minute Oxygen Saturation %  93 %     1 Minute Liters of Oxygen  0 L     2 Minute Oxygen Saturation %  88 %     2 Minute Liters of Oxygen  0 L     3 Minute Oxygen Saturation %  87 %     3 Minute Liters of Oxygen  0 L     4 Minute Oxygen Saturation %  87 %     4 Minute Liters of Oxygen  0 L     5 Minute Oxygen Saturation %  86 %     5 Minute Liters of Oxygen  0 L     6 Minute Oxygen Saturation %  87 %     6 Minute Liters of Oxygen  0 L     2 Minute Post Oxygen Saturation %  94 %     2 Minute Post Liters of Oxygen  0 L        Oxygen Initial Assessment: Oxygen Initial Assessment - 10/01/17 0737      Initial 6 min Walk   Oxygen Used  None      Program Oxygen Prescription   Program Oxygen Prescription  None Patient's lowest saturation was 86% during 52mt-will see how he does on first day and re-evaluate oxygen needs       Oxygen Re-Evaluation: Oxygen Re-Evaluation    Row Name 10/26/17 0705             Program Oxygen Prescription   Program Oxygen Prescription  Continuous;E-Tanks       Liters per minute  2       Comments  only on treadmill         Home Oxygen   Home Oxygen Device  None no home oxygen at this time-will get it provided at home for exercise       Sleep Oxygen Prescription  Continuous        Liters per minute  2       Home Exercise Oxygen Prescription  Continuous       Liters per minute  2       Home at Rest Exercise Oxygen Prescription  None       Compliance with Home Oxygen Use  Yes         Goals/Expected Outcomes   Short Term Goals  To learn and exhibit compliance with exercise, home and travel O2 prescription;To learn and understand importance of monitoring SPO2 with pulse oximeter and demonstrate accurate use of the pulse oximeter.;To learn and understand importance of maintaining oxygen saturations>88%;To learn and demonstrate proper pursed lip breathing techniques or other breathing techniques.;To learn and demonstrate proper use of respiratory medications       Long  Term Goals  Exhibits compliance with exercise, home and travel O2 prescription;Verbalizes importance of monitoring SPO2 with pulse oximeter and return demonstration;Maintenance of O2 saturations>88%;Exhibits proper breathing techniques, such as pursed lip breathing or other method taught during program session;Compliance with respiratory medication;Demonstrates proper use of MDI's       Goals/Expected Outcomes  Patient will cont. to exhibit compliance with night oxygen and when patient receives home oxygen for exercise he will use appropriately.           Oxygen Discharge (Final Oxygen Re-Evaluation): Oxygen Re-Evaluation - 10/26/17 0705      Program Oxygen Prescription   Program Oxygen Prescription  Continuous;E-Tanks    Liters per minute  2    Comments  only on treadmill      Home Oxygen   Home Oxygen Device  None no home oxygen at this time-will get it provided at home for exercise    Sleep Oxygen Prescription  Continuous    Liters per minute  2    Home Exercise Oxygen Prescription  Continuous    Liters per minute  2    Home at Rest Exercise Oxygen Prescription  None    Compliance with Home Oxygen Use  Yes      Goals/Expected Outcomes   Short Term Goals  To learn and exhibit compliance with  exercise, home and travel O2 prescription;To learn and understand importance of monitoring SPO2 with pulse oximeter and demonstrate accurate use of the pulse oximeter.;To learn and understand importance of maintaining oxygen saturations>88%;To learn and demonstrate proper pursed lip breathing techniques or other breathing techniques.;To learn and demonstrate proper use of respiratory medications    Long  Term Goals  Exhibits compliance with exercise, home and travel O2 prescription;Verbalizes importance of monitoring SPO2 with pulse oximeter and return demonstration;Maintenance of O2 saturations>88%;Exhibits proper breathing techniques, such as pursed lip breathing or  other method taught during program session;Compliance with respiratory medication;Demonstrates proper use of MDI's    Goals/Expected Outcomes  Patient will cont. to exhibit compliance with night oxygen and when patient receives home oxygen for exercise he will use appropriately.        Initial Exercise Prescription: Initial Exercise Prescription - 10/01/17 0700      Date of Initial Exercise RX and Referring Provider   Date  10/01/17    Referring Provider  Dr. Nelda Marseille      Treadmill   MPH  1.7    Grade  0    Minutes  17      Bike   Level  0.8    Minutes  17      Rower   Level  2    Watts  30    Minutes  17      Prescription Details   Frequency (times per week)  2    Duration  Progress to 45 minutes of aerobic exercise without signs/symptoms of physical distress      Intensity   THRR 40-80% of Max Heartrate  62-123    Ratings of Perceived Exertion  11-13    Perceived Dyspnea  0-4      Progression   Progression  Continue to progress workloads to maintain intensity without signs/symptoms of physical distress.      Resistance Training   Training Prescription  Yes    Weight  8    Reps  10-15       Perform Capillary Blood Glucose checks as needed.  Exercise Prescription Changes: Exercise Prescription Changes     Row Name 10/06/17 0809 10/08/17 0800 10/13/17 0700 10/15/17 0830       Response to Exercise   Blood Pressure (Admit)  146/90  142/90  -  140/78    Blood Pressure (Exercise)  172/100  184/96  -  156/80    Blood Pressure (Exit)  114/84  118/84  -  104/70    Heart Rate (Admit)  65 bpm  90 bpm  -  68 bpm    Heart Rate (Exercise)  120 bpm  120 bpm  -  114 bpm    Heart Rate (Exit)  98 bpm  107 bpm  -  102 bpm    Oxygen Saturation (Admit)  95 %  91 %  -  97 %    Oxygen Saturation (Exercise)  88 %  87 %  -  91 %    Oxygen Saturation (Exit)  94 %  93 %  -  95 %    Rating of Perceived Exertion (Exercise)  13  13  -  13    Perceived Dyspnea (Exercise)  1  1  -  2    Duration  Progress to 45 minutes of aerobic exercise without signs/symptoms of physical distress  Progress to 45 minutes of aerobic exercise without signs/symptoms of physical distress  -  Continue with 45 min of aerobic exercise without signs/symptoms of physical distress.    Intensity  - 40-80%HRR  - 40-80%HRR  -  -      Progression   Progression  Continue to progress workloads to maintain intensity without signs/symptoms of physical distress.  Continue to progress workloads to maintain intensity without signs/symptoms of physical distress.  -  Continue to progress workloads to maintain intensity without signs/symptoms of physical distress.      Resistance Training   Training Prescription  -  -  -  -  Weight  -  -  -  -    Reps  -  -  -  -    Time  -  -  -  -      Interval Training   Interval Training  -  -  -  No      Treadmill   MPH  2.8  2.8 decreased to 2.5 due to BP  -  2.8    Grade  3  3  -  3    Minutes  17  17  -  17      Bike   Level  1.3  1.3  -  1.3    Minutes  17  17  -  17      Rower   Level  3  3  -  4    Watts  30  41  -  54    Minutes  17  17  -  17      Home Exercise Plan   Plans to continue exercise at  -  -  [employer] (comment)  -    Frequency  -  -  Add 3 additional days to program  exercise sessions.  -       Exercise Comments: Exercise Comments    Row Name 10/13/17 0722           Exercise Comments  Home exercise completed          Exercise Goals and Review: Exercise Goals    [place] Name 09/28/17 1030             Exercise Goals   Increase Physical Activity  Yes       Intervention  Provide advice, education, support and counseling about physical activity/exercise needs.;Develop an individualized exercise prescription for aerobic and resistive training based on initial evaluation findings, risk stratification, comorbidities and participant's personal goals.       Expected Outcomes  Short Term: Attend rehab on a regular basis to increase amount of physical activity.       Increase Strength and Stamina  Yes       Intervention  Provide advice, education, support and counseling about physical activity/exercise needs.;Develop an individualized exercise prescription for aerobic and resistive training based on initial evaluation findings, risk stratification, comorbidities and participant's personal goals.       Expected Outcomes  Short Term: Increase workloads from initial exercise prescription for resistance, speed, and METs.       Able to understand and use rate of perceived exertion (RPE) scale  Yes       Intervention  Provide education and explanation on how to use RPE scale       Expected Outcomes  Short Term: Able to use RPE daily in rehab to express subjective intensity level;Long Term:  Able to use RPE to guide intensity level when exercising independently       Able to understand and use Dyspnea scale  Yes       Intervention  Provide education and explanation on how to use Dyspnea scale       Expected Outcomes  Short Term: Able to use Dyspnea scale daily in rehab to express subjective sense of shortness of breath during exertion;Long Term: Able to use Dyspnea scale to guide intensity level when exercising independently       Knowledge and understanding of  Target Heart Rate Range (THRR)  Yes       Intervention  Provide  education and explanation of THRR including how the numbers were predicted and where they are located for reference       Expected Outcomes  Short Term: Able to state/look up THRR;Long Term: Able to use THRR to govern intensity when exercising independently;Short Term: Able to use daily as guideline for intensity in rehab       Understanding of Exercise Prescription  Yes       Intervention  Provide education, explanation, and written materials on patient's individual exercise prescription       Expected Outcomes  Short Term: Able to explain program exercise prescription;Long Term: Able to explain home exercise prescription to exercise independently          Exercise Goals Re-Evaluation : Exercise Goals Re-Evaluation    Row Name 10/26/17 0713 10/26/17 0724           Exercise Goal Re-Evaluation   Exercise Goals Review  Increase Physical Activity;Able to understand and use rate of perceived exertion (RPE) scale;Knowledge and understanding of Target Heart Rate Range (THRR);Understanding of Exercise Prescription;Increase Strength and Stamina;Able to understand and use Dyspnea scale  -      Comments  Patient has attended 4 rehab sessions. Blood pressure was an issue in the beginning which has now been rectified by adding a new medication. Patient is able to work at an RPE of medium to somewhat hard. On the treadmill in particular, the patient needs 2 liters of oxygen. Patient has been out for the last week with the flu. Will monitor and progress as able when the patient returns to rehab.  -      Expected Outcomes  -  Through exercise at rehab and at home, patient will increase strength and stamina. The patient will also be able to meet his weight loss goal with more exercise days during the week.          Discharge Exercise Prescription (Final Exercise Prescription Changes): Exercise Prescription Changes - 10/15/17 0830      Response  to Exercise   Blood Pressure (Admit)  140/78    Blood Pressure (Exercise)  156/80    Blood Pressure (Exit)  104/70    Heart Rate (Admit)  68 bpm    Heart Rate (Exercise)  114 bpm    Heart Rate (Exit)  102 bpm    Oxygen Saturation (Admit)  97 %    Oxygen Saturation (Exercise)  91 %    Oxygen Saturation (Exit)  95 %    Rating of Perceived Exertion (Exercise)  13    Perceived Dyspnea (Exercise)  2    Duration  Continue with 45 min of aerobic exercise without signs/symptoms of physical distress.      Progression   Progression  Continue to progress workloads to maintain intensity without signs/symptoms of physical distress.      Resistance Training   Training Prescription  --    Weight  --    Reps  --    Time  --      Interval Training   Interval Training  No      Treadmill   MPH  2.8    Grade  3    Minutes  17      Bike   Level  1.3    Minutes  17      Rower   Level  4    Watts  54    Minutes  17       Nutrition:  Target Goals: Understanding of  nutrition guidelines, daily intake of sodium <1570m, cholesterol <2060m calories 30% from fat and 7% or less from saturated fats, daily to have 5 or more servings of fruits and vegetables.  Biometrics: Pre Biometrics - 09/28/17 1135      Pre Biometrics   Grip Strength  50 kg        Nutrition Therapy Plan and Nutrition Goals: Nutrition Therapy & Goals - 10/09/17 1406      Nutrition Therapy   Diet  General, healthful      Personal Nutrition Goals   Nutrition Goal  Identify food quantities necessary to achieve wt loss of  -2# per week to a goal wt loss of 2.7-10.9 kg (6-24 lb) at graduation from pulmonary rehab.      Intervention Plan   Intervention  Prescribe, educate and counsel regarding individualized specific dietary modifications aiming towards targeted core components such as weight, hypertension, lipid management, diabetes, heart failure and other comorbidities.    Expected Outcomes  Short Term Goal:  Understand basic principles of dietary content, such as calories, fat, sodium, cholesterol and nutrients.;Long Term Goal: Adherence to prescribed nutrition plan.       Nutrition Assessments: Nutrition Assessments - 10/09/17 1403      Rate Your Plate Scores   Pre Score  60       Nutrition Goals Re-Evaluation:   Nutrition Goals Discharge (Final Nutrition Goals Re-Evaluation):   Psychosocial: Target Goals: Acknowledge presence or absence of significant depression and/or stress, maximize coping skills, provide positive support system. Participant is able to verbalize types and ability to use techniques and skills needed for reducing stress and depression.  Initial Review & Psychosocial Screening: Initial Psych Review & Screening - 09/28/17 1117      Initial Review   Current issues with  None Identified      Family Dynamics   Good Support System?  Yes      Barriers   Psychosocial barriers to participate in program  There are no identifiable barriers or psychosocial needs.;The patient should benefit from training in stress management and relaxation.      Screening Interventions   Interventions  Encouraged to exercise       Quality of Life Scores:  Scores of 19 and below usually indicate a poorer quality of life in these areas.  A difference of  2-3 points is a clinically meaningful difference.  A difference of 2-3 points in the total score of the Quality of Life Index has been associated with significant improvement in overall quality of life, self-image, physical symptoms, and general health in studies assessing change in quality of life.   PHQ-9: Recent Review Flowsheet Data    [facility]2017 05/21/2017   Decreased Interest 0 0   Down, Depressed, Hopeless 0 0   PHQ - 2 Score 0 0     Interpretation of Total Score  Total Score Depression Severity:  1-4 = Minimal depression, 5-9 = Mild depression, 10-14 = Moderate depression, 15-19 = Moderately severe  depression, 20-27 = Severe depression   Psychosocial Evaluation and Intervention:   Psychosocial Re-Evaluation: Psychosocial Re-Evaluation    Row Name 09/28/17 1120 10/26/17 0852           Psychosocial Re-Evaluation   Current issues with  None Identified  Current Stress Concerns;Current Sleep Concerns      Comments  -  patient was not sleeping well with his oxygen but that has been resolved with humidification. he continues to have multiple stress issues  at work that he shares with the pulmonary rehab staff but feels he is able to deal with the stressors in a healthy way currently.      Expected Outcomes  patient will remain free from psychosocial barriers to participation in pulmonary rehab  patient will remain free from psychosocial barriers to participation in pulmonary rehab      Interventions  -  Encouraged to attend Pulmonary Rehabilitation for the exercise      Continue Psychosocial Services   Follow up required by staff  Follow up required by staff        Initial Review   Source of Stress Concerns  -  Occupation         Psychosocial Discharge (Final Psychosocial Re-Evaluation): Psychosocial Re-Evaluation - 10/26/17 6761      Psychosocial Re-Evaluation   Current issues with  Current Stress Concerns;Current Sleep Concerns    Comments  patient was not sleeping well with his oxygen but that has been resolved with humidification. he continues to have multiple stress issues at work that he shares with the pulmonary rehab staff but feels he is able to deal with the stressors in a healthy way currently.    Expected Outcomes  patient will remain free from psychosocial barriers to participation in pulmonary rehab    Interventions  Encouraged to attend Pulmonary Rehabilitation for the exercise    Continue Psychosocial Services   Follow up required by staff      Initial Review   Source of Stress Concerns  Occupation       Education: Education Goals: Education classes will be  provided on a weekly basis, covering required topics. Participant will state understanding/return demonstration of topics presented.  Learning Barriers/Preferences: Learning Barriers/Preferences - 09/28/17 1110      Learning Barriers/Preferences   Learning Barriers  None    Learning Preferences  Written Material;Computer/Internet;Group Instruction       Education Topics: Risk Factor Reduction:  -Group instruction that is supported by a PowerPoint presentation. Instructor discusses the definition of a risk factor, different risk factors for pulmonary disease, and how the heart and lungs work together.     Nutrition for Pulmonary Patient:  -Group instruction provided by PowerPoint slides, verbal discussion, and written materials to support subject matter. The instructor gives an explanation and review of healthy diet recommendations, which includes a discussion on weight management, recommendations for fruit and vegetable consumption, as well as protein, fluid, caffeine, fiber, sodium, sugar, and alcohol. Tips for eating when patients are short of breath are discussed.   Pursed Lip Breathing:  -Group instruction that is supported by demonstration and informational handouts. Instructor discusses the benefits of pursed lip and diaphragmatic breathing and detailed demonstration on how to preform both.     Oxygen Safety:  -Group instruction provided by PowerPoint, verbal discussion, and written material to support subject matter. There is an overview of "What is Oxygen" and "Why do we need it".  Instructor also reviews how to create a safe environment for oxygen use, the importance of using oxygen as prescribed, and the risks of noncompliance. There is a brief discussion on traveling with oxygen and resources the patient may utilize.   Oxygen Equipment:  -Group instruction provided by [facility] Staff utilizing handouts, written materials, and equipment demonstrations.   Signs and  Symptoms:  -Group instruction provided by written material and verbal discussion to support subject matter. Warning signs and symptoms of infection, stroke, and heart attack are reviewed and when to call the  physician/911 reinforced. Tips for preventing the spread of infection discussed.   Advanced Directives:  -Group instruction provided by verbal instruction and written material to support subject matter. Instructor reviews Advanced Directive laws and proper instruction for filling out document.   Pulmonary Video:  -Group video education that reviews the importance of medication and oxygen compliance, exercise, good nutrition, pulmonary hygiene, and pursed lip and diaphragmatic breathing for the pulmonary patient.   Exercise for the Pulmonary Patient:  -Group instruction that is supported by a PowerPoint presentation. Instructor discusses benefits of exercise, core components of exercise, frequency, duration, and intensity of an exercise routine, importance of utilizing pulse oximetry during exercise, safety while exercising, and options of places to exercise outside of rehab.     Pulmonary Medications:  -Verbally interactive group education provided by instructor with focus on inhaled medications and proper administration.   Anatomy and Physiology of the Respiratory System and Intimacy:  -Group instruction provided by PowerPoint, verbal discussion, and written material to support subject matter. Instructor reviews respiratory cycle and anatomical components of the respiratory system and their functions. Instructor also reviews differences in obstructive and restrictive respiratory diseases with examples of each. Intimacy, Sex, and Sexuality differences are reviewed with a discussion on how relationships can change when diagnosed with pulmonary disease. Common sexual concerns are reviewed.   MD DAY -A group question and answer session with a medical doctor that allows participants to ask  questions that relate to their pulmonary disease state.   OTHER EDUCATION -Group or individual verbal, written, or video instructions that support the educational goals of the pulmonary rehab program.   Holiday Eating Survival Tips:  -Group instruction provided by PowerPoint slides, verbal discussion, and written materials to support subject matter. The instructor gives patients tips, tricks, and techniques to help them not only survive but enjoy the holidays despite the onslaught of food that accompanies the holidays.   Knowledge Questionnaire Score: Knowledge Questionnaire Score - 09/30/17 1422      Knowledge Questionnaire Score   Pre Score  16/18       Core Components/Risk Factors/Patient Goals at Admission: Personal Goals and Risk Factors at Admission - 09/28/17 1117      Core Components/Risk Factors/Patient Goals on Admission    Weight Management  Yes;Obesity    Intervention  Obesity: Provide education and appropriate resources to help participant work on and attain dietary goals.;Weight Management/Obesity: Establish reasonable short term and long term weight goals.;Weight Management: Provide education and appropriate resources to help participant work on and attain dietary goals.;Weight Management: Develop a combined nutrition and exercise program designed to reach desired caloric intake, while maintaining appropriate intake of nutrient and fiber, sodium and fats, and appropriate energy expenditure required for the weight goal.    Expected Outcomes  Understanding of distribution of calorie intake throughout the day with the consumption of 4-5 meals/snacks;Understanding recommendations for meals to include 15-35% energy as protein, 25-35% energy from fat, 35-60% energy from carbohydrates, less than 271m of dietary cholesterol, 20-35 gm of total fiber daily;Weight Loss: Understanding of general recommendations for a balanced deficit meal plan, which promotes 1-2 lb weight loss per week  and includes a negative energy balance of [phone] kcal/d    Improve shortness of breath with ADL's  Yes    Intervention  Provide education, individualized exercise plan and daily activity instruction to help decrease symptoms of SOB with activities of daily living.    Expected Outcomes  Short Term: Improve cardiorespiratory fitness to achieve a reduction of  symptoms when performing ADLs;Long Term: Be able to perform more ADLs without symptoms or delay the onset of symptoms       Core Components/Risk Factors/Patient Goals Review:  Goals and Risk Factor Review    Row Name 10/23/17 0825             Core Components/Risk Factors/Patient Goals Review   Personal Goals Review  Weight Management/Obesity;Improve shortness of breath with ADL's;Develop more efficient breathing techniques such as purse lipped breathing and diaphragmatic breathing and practicing self-pacing with activity.;Hypertension;Stress       Review  Patient has attended 4 sessions since admission. He has been absent the last two session related to diagnosis of the flu. It is too soon in the program for him to see improvement in his shortness of breath wiith ADLs however there have been several barriers to progression identified. During his first 2 sessions it was identified that he was hypertensive at rest and with exercise. 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 He continues to be coached on [place] however he most recently was observed using the breathing technique without promptimg. He has met with the department RD to discuss weightloss in preparation with meeting the lung transplant team. Admission weight 121.9, weight 10/15/17 120.7       Expected Outcomes  see admission expected  outcomes.          Core Components/Risk Factors/Patient Goals at Discharge (Final Review):  Goals and Risk Factor Review - 10/23/17 0825      Core Components/Risk Factors/Patient Goals Review   Personal Goals Review  Weight Management/Obesity;Improve shortness of breath with ADL's;Develop more efficient breathing techniques such as purse lipped breathing and diaphragmatic breathing and practicing self-pacing with activity.;Hypertension;Stress    Review  Patient has attended 4 sessions since admission. He has been absent the last two session related to diagnosis of the flu. It is too soon in the program for him to see improvement in his shortness of breath wiith ADLs however there have been several barriers to progression identified. During his first 2 sessions it was identified that he was hypertensive at rest and with exercise. 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 He continues to be coached on [place] however he most recently was observed using the breathing technique without promptimg. He has met with the department RD to discuss weightloss in preparation with meeting the lung transplant team. Admission weight 121.9, weight 10/15/17 120.7    Expected Outcomes  see admission expected outcomes.       ITP Comments:   Resistance training has been discontinued from patient exercise prescription related to patient doing resistance training with personal trainer 2 days a week. Pulmonary rehab will continue to focus on cardiovascular and pulmonary reconditioning. Comments: patient has attended 4 sessions since admission.

## 2017-10-27 ENCOUNTER — Encounter (HOSPITAL_COMMUNITY)
Admission: RE | Admit: 2017-10-27 | Discharge: 2017-10-27 | Disposition: A | Discharge status: Home / Self Care | Payer: Medicare Other | Source: Ambulatory Visit | Attending: Pulmonary Disease | Admitting: Pulmonary Disease

## 2017-10-27 DIAGNOSIS — J8410 Pulmonary fibrosis, unspecified: Secondary | ICD-10-CM | POA: Diagnosis not present

## 2017-10-27 NOTE — Progress Notes (Signed)
Daily Session Note  Patient Details  Name: Alex Atkinson MRN: [id number] Date of Birth: Feb 04, 1951 Referring Provider:     Pulmonary Rehab Walk Test from 09/29/2017 in [place]  Referring Provider  Dr. Nelda Marseille      Encounter Date: 10/27/2017  Check In: Session Check In - 10/27/17 0645      Check-In   Staff Present  Su Hilt, MS, ACSM RCEP, Exercise Physiologist;Pattijo Juste Rollene Rotunda, RN, BSN    Supervising physician immediately available to respond to emergencies  [facility] immediately available    Physician(s)  Dr. Horris Latino    Medication changes reported      Yes    Comments  prednisone decreased to 46m    Fall or balance concerns reported     No    Comments  neuropathy left foot/lack of feeling    Tobacco Cessation  No Change    Warm-up and Cool-down  Performed as group-led instruction    Resistance Training Performed  No    VAD Patient?  No      Pain Assessment   Currently in Pain?  No/denies    Multiple Pain Sites  No       Capillary Blood Glucose: No results found for this or any previous visit (from the past 24 hour(s)).    Social History   Tobacco Use  Smoking Status Never Smoker  Smokeless Tobacco Never Used    Goals Met:  Independence with exercise equipment Exercise tolerated well Queuing for purse lip breathing No report of cardiac concerns or symptoms  Goals Unmet:  O2 Sat patient required an increase of oxygen from 2 liters to 4 liters on the track and required 2 liters on the rower and airdyne. This may be related to his decrease in prednisone.  Comments: Service time is from 0645 to 031  Dr. WRush Farmeris [facility]

## 2017-10-29 ENCOUNTER — Encounter (HOSPITAL_COMMUNITY): Payer: Medicare Other

## 2017-10-29 DIAGNOSIS — M359 Systemic involvement of connective tissue, unspecified: Secondary | ICD-10-CM | POA: Diagnosis not present

## 2017-10-29 DIAGNOSIS — Z9981 Dependence on supplemental oxygen: Secondary | ICD-10-CM | POA: Diagnosis not present

## 2017-10-29 DIAGNOSIS — I776 Arteritis, unspecified: Secondary | ICD-10-CM | POA: Diagnosis not present

## 2017-10-29 DIAGNOSIS — Z792 Long term (current) use of antibiotics: Secondary | ICD-10-CM | POA: Diagnosis not present

## 2017-10-29 DIAGNOSIS — Z7952 Long term (current) use of systemic steroids: Secondary | ICD-10-CM | POA: Diagnosis not present

## 2017-10-29 DIAGNOSIS — M791 Myalgia, unspecified site: Secondary | ICD-10-CM | POA: Diagnosis not present

## 2017-10-29 DIAGNOSIS — R509 Fever, unspecified: Secondary | ICD-10-CM | POA: Diagnosis not present

## 2017-10-29 DIAGNOSIS — R5383 Other fatigue: Secondary | ICD-10-CM | POA: Diagnosis not present

## 2017-10-29 DIAGNOSIS — J849 Interstitial pulmonary disease, unspecified: Secondary | ICD-10-CM | POA: Diagnosis not present

## 2017-10-29 DIAGNOSIS — R208 Other disturbances of skin sensation: Secondary | ICD-10-CM | POA: Diagnosis not present

## 2017-10-29 DIAGNOSIS — Z79899 Other long term (current) drug therapy: Secondary | ICD-10-CM | POA: Diagnosis not present

## 2017-10-29 DIAGNOSIS — R768 Other specified abnormal immunological findings in serum: Secondary | ICD-10-CM | POA: Diagnosis not present

## 2017-10-30 NOTE — Progress Notes (Signed)
Alex Atkinson 67 y.o. male  DOB: April 10, 1951 MRN: [id number]           Nutrition Brief Note 1. Pulmonary fibrosis ([place])   Note Spoke with pt and pt's wife,Alex Atkinson. Pt is doing well with his wt loss. Pt's wife is preparing meals and snacks. Pt wt this am reportedly 254.6 this morning and 260 lb yesterday at his MD appt. Pt wt is down 6 lb over the past 2+ weeks. Pt wt is down approximately 12-13 lb from his wt 6 weeks ago. Rate of wt loss appears safe at this time. Pt denies c/o of hunger and is satified with his plan at this time. Pt plans on traveling over the next month. Potential eating challenges discussed.   Pt expressed understanding of the information reviewed via feedback method.      Wt Readings from Last 3 Encounters:  10/15/17 266 lb 1.5 oz (120.7 kg)  10/08/17 265 lb 3.4 oz (120.3 kg)  10/06/17 268 lb 11.9 oz (121.9 kg)    Nutrition Diagnosis ? Food-and nutrition-related knowledge deficit related to lack of exposure to information as related to diagnosis of pulmonary disease ? Obesity related to excessive energy intake as evidenced by a BMI of 36.8  Nutrition Intervention ? Pt's individual nutrition plan and goals reviewed with pt. ? Handouts given for: Nutrition II class ? Pt to attend the Nutrition and Lung Disease class  Goal(s) 1. Identify food quantities necessary to achieve wt loss of  -2# per week to a goal wt loss of 2.7-10.9 kg (6-24 lb) at graduation from pulmonary rehab.  Plan:  Pt to attend Pulmonary Nutrition class Will provide client-centered nutrition education as part of interdisciplinary care.   Monitor and evaluate progress toward nutrition goal with team.  Monitor and Evaluate progress toward nutrition goal with team.   Derek Mound, M.Ed, RD, LDN, CDE 10/30/2017 3:09 PM

## 2017-11-03 ENCOUNTER — Encounter (HOSPITAL_COMMUNITY)
Admission: RE | Admit: 2017-11-03 | Discharge: 2017-11-03 | Disposition: A | Discharge status: Home / Self Care | Payer: Medicare Other | Source: Ambulatory Visit | Attending: Pulmonary Disease | Admitting: Pulmonary Disease

## 2017-11-03 VITALS — Wt 256.2 lb

## 2017-11-03 DIAGNOSIS — J8410 Pulmonary fibrosis, unspecified: Secondary | ICD-10-CM

## 2017-11-03 NOTE — Progress Notes (Signed)
Daily Session Note  Patient Details  Name: Alex Atkinson MRN: [id number] Date of Birth: May 22, 1951 Referring Provider:     Pulmonary Rehab Walk Test from 09/29/2017 in [place]  Referring Provider  Dr. Nelda Marseille      Encounter Date: 11/03/2017  Check In: Session Check In - 11/03/17 0711      Check-In   Location  [facility]    Staff Present  Su Hilt, MS, ACSM RCEP, Exercise Physiologist;Tianne Plott Rollene Rotunda, RN, BSN    Supervising physician immediately available to respond to emergencies  [facility] immediately available    Physician(s)  Dr. Wendee Beavers    Medication changes reported      No    Fall or balance concerns reported     No    Tobacco Cessation  No Change    Warm-up and Cool-down  Performed as group-led instruction    Resistance Training Performed  Yes    VAD Patient?  No      Pain Assessment   Currently in Pain?  No/denies    Multiple Pain Sites  No       Capillary Blood Glucose: No results found for this or any previous visit (from the past 24 hour(s)).  Exercise Prescription Changes - 11/03/17 0852      Response to Exercise   Blood Pressure (Admit)  124/78    Blood Pressure (Exercise)  130/80    Blood Pressure (Exit)  100/70    Heart Rate (Admit)  65 bpm    Heart Rate (Exercise)  101 bpm    Heart Rate (Exit)  100 bpm    Oxygen Saturation (Admit)  96 %    Oxygen Saturation (Exercise)  91 %    Oxygen Saturation (Exit)  94 %    Rating of Perceived Exertion (Exercise)  12    Perceived Dyspnea (Exercise)  1    Duration  Continue with 45 min of aerobic exercise without signs/symptoms of physical distress.      Progression   Progression  Continue to progress workloads to maintain intensity without signs/symptoms of physical distress.      Interval Training   Interval Training  No      Treadmill   MPH  2.8    Grade  3    Minutes  17      Bike   Level  1.4    Minutes  17      Rower   Level  4     Watts  55    Minutes  17       Social History   Tobacco Use  Smoking Status Never Smoker  Smokeless Tobacco Never Used    Goals Met:  Independence with exercise equipment Exercise tolerated well Personal goals reviewed Queuing for purse lip breathing No report of cardiac concerns or symptoms  Goals Unmet:  Not Applicable  Comments: Service time is from 0645 to 17   Dr. Rush Farmer is Medical Director for Pulmonary Rehab at [facility].

## 2017-11-05 ENCOUNTER — Encounter (HOSPITAL_COMMUNITY)
Admission: RE | Admit: 2017-11-05 | Discharge: 2017-11-05 | Disposition: A | Discharge status: Home / Self Care | Payer: Medicare Other | Source: Ambulatory Visit | Attending: Pulmonary Disease | Admitting: Pulmonary Disease

## 2017-11-05 VITALS — Wt 253.3 lb

## 2017-11-05 DIAGNOSIS — J8410 Pulmonary fibrosis, unspecified: Secondary | ICD-10-CM

## 2017-11-05 NOTE — Progress Notes (Signed)
Daily Session Note  Patient Details  Name: Alex Atkinson MRN: [id number] Date of Birth: June 04, 1951 Referring Provider:     Pulmonary Rehab Walk Test from 09/29/2017 in [place]  Referring Provider  Dr. Nelda Marseille      Encounter Date: 11/05/2017  Check In: Session Check In - 11/05/17 0654      Check-In   Location  [facility]    Staff Present  Su Hilt, MS, ACSM RCEP, Exercise Physiologist;Portia Rollene Rotunda, RN, BSN    Supervising physician immediately available to respond to emergencies  [facility] immediately available    Physician(s)  Dr. Lonny Prude    Medication changes reported      No    Tobacco Cessation  No Change    Warm-up and Cool-down  Performed as group-led instruction    Resistance Training Performed  Yes    VAD Patient?  No      Pain Assessment   Currently in Pain?  No/denies    Multiple Pain Sites  No       Capillary Blood Glucose: No results found for this or any previous visit (from the past 24 hour(s)).  Exercise Prescription Changes - 11/05/17 0900      Response to Exercise   Blood Pressure (Admit)  112/80    Blood Pressure (Exercise)  148/84    Blood Pressure (Exit)  100/68    Heart Rate (Admit)  70 bpm    Heart Rate (Exercise)  101 bpm    Heart Rate (Exit)  105 bpm    Oxygen Saturation (Admit)  95 %    Oxygen Saturation (Exercise)  92 %    Oxygen Saturation (Exit)  95 %    Rating of Perceived Exertion (Exercise)  13    Perceived Dyspnea (Exercise)  1    Duration  Continue with 45 min of aerobic exercise without signs/symptoms of physical distress.      Progression   Progression  Continue to progress workloads to maintain intensity without signs/symptoms of physical distress.      Interval Training   Interval Training  No      Treadmill   MPH  2.8    Grade  3    Minutes  17      Bike   Level  1.4    Minutes  17      Rower   Level  4    Watts  49    Minutes  17        Social History   Tobacco Use  Smoking Status Never Smoker  Smokeless Tobacco Never Used    Goals Met:  Exercise tolerated well No report of cardiac concerns or symptoms  Goals Unmet:  Not Applicable  Comments: Service time is from 6:45a to 8:00a    Dr. Rush Farmer is Medical Director for Pulmonary Rehab at [facility].

## 2017-11-06 ENCOUNTER — Ambulatory Visit: Payer: Medicare Other | Admitting: Neurology

## 2017-11-10 ENCOUNTER — Encounter (HOSPITAL_COMMUNITY)
Admission: RE | Admit: 2017-11-10 | Discharge: 2017-11-10 | Disposition: A | Discharge status: Home / Self Care | Payer: Medicare Other | Source: Ambulatory Visit | Attending: Pulmonary Disease | Admitting: Pulmonary Disease

## 2017-11-10 DIAGNOSIS — J8410 Pulmonary fibrosis, unspecified: Secondary | ICD-10-CM | POA: Diagnosis not present

## 2017-11-10 NOTE — Progress Notes (Signed)
Daily Session Note  Patient Details  Name: Alex Atkinson MRN: [id number] Date of Birth: [birth year] Referring Provider:     Pulmonary Rehab Walk Test from 09/29/2017 in [place]  Referring Provider  Dr. Nelda Marseille      Encounter Date: 11/10/2017  Check In: Session Check In - 11/10/17 0700      Check-In   Location  [facility]    Staff Present  Su Hilt, MS, ACSM RCEP, Exercise Physiologist;Laderius Valbuena Rollene Rotunda, RN, BSN    Supervising physician immediately available to respond to emergencies  [facility] immediately available    Physician(s)  Dr. Lonny Prude    Medication changes reported      No    Fall or balance concerns reported     No    Tobacco Cessation  No Change    Warm-up and Cool-down  Performed as group-led instruction    Resistance Training Performed  No    VAD Patient?  No      Pain Assessment   Currently in Pain?  No/denies    Multiple Pain Sites  No       Capillary Blood Glucose: No results found for this or any previous visit (from the past 24 hour(s)).    Social History   Tobacco Use  Smoking Status Never Smoker  Smokeless Tobacco Never Used    Goals Met:  Independence with exercise equipment Improved SOB with ADL's Using PLB without cueing & demonstrates good technique Achieving weight loss Changing diet to healthy choices, watching portion sizes Exercise tolerated well No report of cardiac concerns or symptoms  Goals Unmet:  Not Applicable  Comments: Service time is from 0645 to 0800   Dr. Rush Farmer is Medical Director for Pulmonary Rehab at [facility].

## 2017-11-12 ENCOUNTER — Encounter (HOSPITAL_COMMUNITY)
Admission: RE | Admit: 2017-11-12 | Discharge: 2017-11-12 | Disposition: A | Discharge status: Home / Self Care | Payer: Medicare Other | Source: Ambulatory Visit | Attending: Pulmonary Disease | Admitting: Pulmonary Disease

## 2017-11-12 DIAGNOSIS — J8410 Pulmonary fibrosis, unspecified: Secondary | ICD-10-CM

## 2017-11-12 NOTE — Progress Notes (Signed)
Daily Session Note  Patient Details  Name: Martell W Bultman MRN: [id number] Date of Birth: 04/30/1951 Referring Provider:     Pulmonary Rehab Walk Test from 09/29/2017 in [facility]  Referring Provider  Dr. Yacoub      Encounter Date: 11/12/2017  Check In: Session Check In - 11/12/17 0656      Check-In   Location  [facility]    Staff Present  Molly diVincenzo, MS, ACSM RCEP, Exercise Physiologist;Portia Payne, RN, BSN    Supervising physician immediately available to respond to emergencies  [facility] immediately available    Physician(s)  Dr. Patel    Medication changes reported      No    Fall or balance concerns reported     No    Tobacco Cessation  No Change    Warm-up and Cool-down  Performed as group-led instruction    Resistance Training Performed  Yes    VAD Patient?  No      Pain Assessment   Currently in Pain?  No/denies    Multiple Pain Sites  No       Capillary Blood Glucose: No results found for this or any previous visit (from the past 24 hour(s)).    Social History   Tobacco Use  Smoking Status Never Smoker  Smokeless Tobacco Never Used    Goals Met:  Exercise tolerated well No report of cardiac concerns or symptoms Strength training completed today  Goals Unmet:  Not Applicable  Comments: Service time is from 6:45a to 8:00A    Dr. Wesam G. Yacoub is Medical Director for Pulmonary Rehab at [facility]. 

## 2017-11-17 ENCOUNTER — Encounter (HOSPITAL_COMMUNITY)
Admission: RE | Admit: 2017-11-17 | Discharge: 2017-11-17 | Disposition: A | Discharge status: Home / Self Care | Payer: Medicare Other | Source: Ambulatory Visit | Attending: Pulmonary Disease | Admitting: Pulmonary Disease

## 2017-11-17 VITALS — Wt 250.4 lb

## 2017-11-17 DIAGNOSIS — J8410 Pulmonary fibrosis, unspecified: Secondary | ICD-10-CM | POA: Diagnosis not present

## 2017-11-17 NOTE — Progress Notes (Signed)
Daily Session Note  Patient Details  Name: Alex Atkinson MRN: [id number] Date of Birth: 04-06-1951 Referring Provider:     Pulmonary Rehab Walk Test from 09/29/2017 in [place]  Referring Provider  Dr. Nelda Marseille      Encounter Date: 11/17/2017  Check In: Session Check In - 11/17/17 0645      Check-In   Location  [facility]    Staff Present  Su Hilt, MS, ACSM RCEP, Exercise Physiologist;Portia Rollene Rotunda, RN, BSN    Supervising physician immediately available to respond to emergencies  [facility] immediately available    Physician(s)  Dr. Posey Pronto    Medication changes reported      No    Fall or balance concerns reported     No    Tobacco Cessation  No Change    Warm-up and Cool-down  Performed as group-led instruction    Resistance Training Performed  No    VAD Patient?  No      Pain Assessment   Currently in Pain?  No/denies    Multiple Pain Sites  No       Capillary Blood Glucose: No results found for this or any previous visit (from the past 24 hour(s)).  Exercise Prescription Changes - 11/17/17 0800      Response to Exercise   Blood Pressure (Admit)  118/80    Blood Pressure (Exercise)  168/54    Blood Pressure (Exit)  100/68    Heart Rate (Admit)  85 bpm    Heart Rate (Exercise)  123 bpm    Heart Rate (Exit)  112 bpm    Oxygen Saturation (Admit)  93 %    Oxygen Saturation (Exercise)  91 %    Oxygen Saturation (Exit)  93 %    Rating of Perceived Exertion (Exercise)  14    Perceived Dyspnea (Exercise)  2    Duration  Continue with 45 min of aerobic exercise without signs/symptoms of physical distress.      Progression   Progression  Continue to progress workloads to maintain intensity without signs/symptoms of physical distress.      Interval Training   Interval Training  No      Oxygen   Oxygen  Continuous 2-3 liters    Liters  -- 2-3 liters      Treadmill   MPH  3    Grade  4    Minutes   17      Bike   Level  --    Minutes  --      Elliptical   Level  2    Speed  1    Minutes  17      Rower   Level  5    Watts  62    Minutes  17       Social History   Tobacco Use  Smoking Status Never Smoker  Smokeless Tobacco Never Used    Goals Met:  Exercise tolerated well No report of cardiac concerns or symptoms Strength training completed today  Goals Unmet:  Not Applicable  Comments: Service time is from 6:45a to 8:00a    Dr. Rush Farmer is Medical Director for Pulmonary Rehab at [facility].

## 2017-11-19 ENCOUNTER — Encounter (HOSPITAL_COMMUNITY)
Admission: RE | Admit: 2017-11-19 | Discharge: 2017-11-19 | Disposition: A | Discharge status: Home / Self Care | Payer: Medicare Other | Source: Ambulatory Visit | Attending: Pulmonary Disease | Admitting: Pulmonary Disease

## 2017-11-19 DIAGNOSIS — J8410 Pulmonary fibrosis, unspecified: Secondary | ICD-10-CM

## 2017-11-19 NOTE — Progress Notes (Signed)
Daily Session Note  Patient Details  Name: Alex Atkinson MRN: [id number] Date of Birth: 21-Jul-1951 Referring Provider:     Pulmonary Rehab Walk Test from 09/29/2017 in [place]  Referring Provider  Dr. Nelda Marseille      Encounter Date: 11/19/2017  Check In: Session Check In - 11/19/17 0700      Check-In   Location  [facility]    Staff Present  Su Hilt, MS, ACSM RCEP, Exercise Physiologist;Portia Rollene Rotunda, RN, BSN    Supervising physician immediately available to respond to emergencies  [facility] immediately available    Physician(s)  Dr. Lonny Prude    Medication changes reported      No    Fall or balance concerns reported     No    Tobacco Cessation  No Change    Warm-up and Cool-down  Performed as group-led instruction    Resistance Training Performed  Yes    VAD Patient?  No      Pain Assessment   Currently in Pain?  No/denies    Multiple Pain Sites  No       Capillary Blood Glucose: No results found for this or any previous visit (from the past 24 hour(s)).    Social History   Tobacco Use  Smoking Status Never Smoker  Smokeless Tobacco Never Used    Goals Met:  Exercise tolerated well No report of cardiac concerns or symptoms Strength training completed today  Goals Unmet:  Not Applicable  Comments: Service time is from 6:45a to 8:00a    Dr. Rush Farmer is Medical Director for Pulmonary Rehab at [facility].

## 2017-11-23 NOTE — Progress Notes (Addendum)
Pulmonary Individual Treatment Plan  Patient Details  Name: Alex Atkinson MRN: [id number] Date of Birth: [birth year] Referring Provider:     Pulmonary Rehab Walk Test from 09/29/2017 in [place]  Referring Provider  Dr. Nelda Marseille      Initial Encounter Date:    Pulmonary Rehab Walk Test from 09/29/2017 in [place]  Date  10/01/17  Referring Provider  Dr. Nelda Marseille      Visit Diagnosis: Pulmonary fibrosis ([place])  Patient's Home Medications on Admission:   Current Outpatient Medications:  .  B Complex Vitamins (VITAMIN-B COMPLEX PO), Take 1 tablet by mouth daily., Disp: , Rfl:  .  calcium citrate-vitamin D (CITRACAL+D) 315-200 MG-UNIT tablet, Take 1 tablet by mouth 2 (two) times daily., Disp: , Rfl:  .  famotidine (PEPCID) 20 MG tablet, One at bedtime (Patient taking differently: Take 20 mg by mouth at bedtime. ), Disp: 30 tablet, Rfl: 11 .  gabapentin (NEURONTIN) 300 MG capsule, Take 300 mg by mouth at bedtime. , Disp: , Rfl:  .  mycophenolate (CELLCEPT) 500 MG tablet, Take 1,000 mg by mouth., Disp: , Rfl:  .  pantoprazole (PROTONIX) 40 MG tablet, Take 30- 60 min before your first and last meals of the day (Patient taking differently: Take 40 mg by mouth daily before breakfast. Take 30- 60 min before your first and last meals of the day), Disp: 60 tablet, Rfl: 2 .  predniSONE (DELTASONE) 10 MG tablet, Take 4 tablets (40 mg total) by mouth daily with breakfast. (Patient taking differently: Take 25 mg by mouth daily with breakfast. 2.5 tablets.), Disp: 120 tablet, Rfl: 1 .  sulfamethoxazole-trimethoprim (BACTRIM DS,SEPTRA DS) 800-160 MG tablet, Take 1 tablet by mouth every Tuesday, Thursday, and Saturday at 6 PM. In the morning., Disp: , Rfl:  .  traMADol (ULTRAM) 50 MG tablet, 1-2 every 4 hours as needed for cough or pain, Disp: 40 tablet, Rfl: 0 .  Turmeric 500 MG TABS, Take 1,000 mg by mouth at bedtime. , Disp: , Rfl:    Past Medical History: Past Medical History:  Diagnosis Date  . Diverticulosis of colon 2005   Dr Fuller Plan  . Hemorrhoid 09/06/14   developed ? hemorrhoid  . Mononucleosis 1973  . PVC's (premature ventricular contractions)    caffeine induced; evaluated by Dr Aldona Bar    Tobacco Use: Social History   Tobacco Use  Smoking Status Never Smoker  Smokeless Tobacco Never Used    Labs: Recent Review Flowsheet Data    Labs for ITP Cardiac and Pulmonary Rehab Latest Ref Rng & Units 05/09/2011 07/23/2012 07/26/2012   Cholestrol 0 - 200 mg/dL 204(H) 198 -   LDLCALC 0 - 99 mg/dL - 138(H) -   LDLDIRECT mg/dL 144.8 - -   HDL >39.00 mg/dL 41.00 35.30(L) -   Trlycerides 0.0 - 149.0 mg/dL 114.0 125.0 -   Hemoglobin A1c 4.6 - 6.5 % - - 5.5      Capillary Blood Glucose: No results found for: GLUCAP   Pulmonary Assessment Scores: Pulmonary Assessment Scores    Row Name 09/30/17 1422 10/01/17 0738       ADL UCSD   ADL Phase  Entry  Entry    SOB Score total  15  -      CAT Score   CAT Score  5 Entry  -      mMRC Score   mMRC Score  -  1  Pulmonary Function Assessment: Pulmonary Function Assessment - 09/28/17 1111      Breath   Bilateral Breath Sounds  Other    Other  course crackles mid to lower lobes bilat    Shortness of Breath  Limiting activity;Yes       Exercise Target Goals:    Exercise Program Goal: Individual exercise prescription set using results from initial 6 min walk test and THRR while considering  patient's activity barriers and safety.    Exercise Prescription Goal: Initial exercise prescription builds to 30-45 minutes a day of aerobic activity, 2-3 days per week.  Home exercise guidelines will be given to patient during program as part of exercise prescription that the participant will acknowledge.  Activity Barriers & Risk Stratification:   6 Minute Walk: 6 Minute Walk    Row Name 10/01/17 0739 11/10/17 0849       6 Minute Walk   Phase   Initial  Mid Program    Distance  1700 feet  1400 feet    Walk Time  6 minutes  6 minutes    # of Rest Breaks  0  0    MPH  3.21  2.65    METS  3.45  2.99    RPE  13  13    Perceived Dyspnea   1.5  2    Symptoms  No  No    Resting HR  86 bpm  91 bpm    Resting BP  128/90  114/81    Resting Oxygen Saturation   94 %  97 %    Exercise Oxygen Saturation  during 6 min walk  86 %  87 %    Max Ex. HR  110 bpm  113 bpm    Max Ex. BP  180/88  112/73    2 Minute Post BP  140/92  -      Interval HR   1 Minute HR  98  97    2 Minute HR  108  102    3 Minute HR  111  102    4 Minute HR  108  102    5 Minute HR  108  102    6 Minute HR  110  113    2 Minute Post HR  92  101    Interval Heart Rate?  Yes  Yes      Interval Oxygen   Interval Oxygen?  Yes  Yes    Baseline Oxygen Saturation %  94 %  97 %    1 Minute Oxygen Saturation %  93 %  93 %    1 Minute Liters of Oxygen  0 L  0 L    2 Minute Oxygen Saturation %  88 %  90 %    2 Minute Liters of Oxygen  0 L  0 L    3 Minute Oxygen Saturation %  87 %  88 %    3 Minute Liters of Oxygen  0 L  0 L    4 Minute Oxygen Saturation %  87 %  87 %    4 Minute Liters of Oxygen  0 L  0 L    5 Minute Oxygen Saturation %  86 %  87 %    5 Minute Liters of Oxygen  0 L  2 L    6 Minute Oxygen Saturation %  87 %  89 %    6 Minute Liters of Oxygen  0  L  2 L    2 Minute Post Oxygen Saturation %  94 %  95 %    2 Minute Post Liters of Oxygen  0 L  2 L       Oxygen Initial Assessment: Oxygen Initial Assessment - 10/01/17 0737      Initial 6 min Walk   Oxygen Used  None      Program Oxygen Prescription   Program Oxygen Prescription  None Patient's lowest saturation was 86% during 9mt-will see how he does on first day and re-evaluate oxygen needs       Oxygen Re-Evaluation: Oxygen Re-Evaluation    Row Name 10/26/17 0705 11/20/17 1013           Program Oxygen Prescription   Program Oxygen Prescription  Continuous;E-Tanks  None      Liters  per minute  2  - 4 liters on treadmill-3 liters on bike, elliptical, and rower      Comments  only on treadmill  -        Home Oxygen   Home Oxygen Device  None no home oxygen at this time-will get it provided at home for exercise  E-Tanks Dr. PPosey Prontois going to place order for POC      Sleep Oxygen Prescription  Continuous  Continuous      Liters per minute  2  2      Home Exercise Oxygen Prescription  Continuous  Continuous      Liters per minute  2  - 3-4      Home at Rest Exercise Oxygen Prescription  None  None      Compliance with Home Oxygen Use  Yes  Yes        Goals/Expected Outcomes   Short Term Goals  To learn and exhibit compliance with exercise, home and travel O2 prescription;To learn and understand importance of monitoring SPO2 with pulse oximeter and demonstrate accurate use of the pulse oximeter.;To learn and understand importance of maintaining oxygen saturations>88%;To learn and demonstrate proper pursed lip breathing techniques or other breathing techniques.;To learn and demonstrate proper use of respiratory medications  To learn and exhibit compliance with exercise, home and travel O2 prescription;To learn and understand importance of monitoring SPO2 with pulse oximeter and demonstrate accurate use of the pulse oximeter.;To learn and understand importance of maintaining oxygen saturations>88%;To learn and demonstrate proper pursed lip breathing techniques or other breathing techniques.;To learn and demonstrate proper use of respiratory medications      Long  Term Goals  Exhibits compliance with exercise, home and travel O2 prescription;Verbalizes importance of monitoring SPO2 with pulse oximeter and return demonstration;Maintenance of O2 saturations>88%;Exhibits proper breathing techniques, such as pursed lip breathing or other method taught during program session;Compliance with respiratory medication;Demonstrates proper use of MDI's  Exhibits compliance with exercise, home and  travel O2 prescription;Verbalizes importance of monitoring SPO2 with pulse oximeter and return demonstration;Maintenance of O2 saturations>88%;Exhibits proper breathing techniques, such as pursed lip breathing or other method taught during program session;Compliance with respiratory medication;Demonstrates proper use of MDI's      Goals/Expected Outcomes  Patient will cont. to exhibit compliance with night oxygen and when patient receives home oxygen for exercise he will use appropriately.   Patient will cont. to exhibit compliance with night oxygen and when patient receives home oxygen for exercise he will use appropriately.          Oxygen Discharge (Final Oxygen Re-Evaluation): Oxygen Re-Evaluation - 11/20/17 1013  Program Oxygen Prescription   Program Oxygen Prescription  None    Liters per minute  -- 4 liters on treadmill-3 liters on bike, elliptical, and rower      Home Oxygen   Home Oxygen Device  E-Tanks Dr. Posey Pronto is going to place order for POC    Sleep Oxygen Prescription  Continuous    Liters per minute  2    Home Exercise Oxygen Prescription  Continuous    Liters per minute  -- 3-4    Home at Rest Exercise Oxygen Prescription  None    Compliance with Home Oxygen Use  Yes      Goals/Expected Outcomes   Short Term Goals  To learn and exhibit compliance with exercise, home and travel O2 prescription;To learn and understand importance of monitoring SPO2 with pulse oximeter and demonstrate accurate use of the pulse oximeter.;To learn and understand importance of maintaining oxygen saturations>88%;To learn and demonstrate proper pursed lip breathing techniques or other breathing techniques.;To learn and demonstrate proper use of respiratory medications    Long  Term Goals  Exhibits compliance with exercise, home and travel O2 prescription;Verbalizes importance of monitoring SPO2 with pulse oximeter and return demonstration;Maintenance of O2 saturations>88%;Exhibits proper breathing  techniques, such as pursed lip breathing or other method taught during program session;Compliance with respiratory medication;Demonstrates proper use of MDI's    Goals/Expected Outcomes  Patient will cont. to exhibit compliance with night oxygen and when patient receives home oxygen for exercise he will use appropriately.        Initial Exercise Prescription: Initial Exercise Prescription - 10/01/17 0700      Date of Initial Exercise RX and Referring Provider   Date  10/01/17    Referring Provider  Dr. Nelda Marseille      Treadmill   MPH  1.7    Grade  0    Minutes  17      Bike   Level  0.8    Minutes  17      Rower   Level  2    Watts  30    Minutes  17      Prescription Details   Frequency (times per week)  2    Duration  Progress to 45 minutes of aerobic exercise without signs/symptoms of physical distress      Intensity   THRR 40-80% of Max Heartrate  62-123    Ratings of Perceived Exertion  11-13    Perceived Dyspnea  0-4      Progression   Progression  Continue to progress workloads to maintain intensity without signs/symptoms of physical distress.      Resistance Training   Training Prescription  Yes    Weight  8    Reps  10-15       Perform Capillary Blood Glucose checks as needed.  Exercise Prescription Changes: Exercise Prescription Changes    Row Name 10/06/17 0809 10/08/17 0800 10/13/17 0700 10/15/17 0830 11/03/17 0852     Response to Exercise   Blood Pressure (Admit)  146/90  142/90  -  140/78  124/78   Blood Pressure (Exercise)  172/100  184/96  -  156/80  130/80   Blood Pressure (Exit)  114/84  118/84  -  104/70  100/70   Heart Rate (Admit)  65 bpm  90 bpm  -  68 bpm  65 bpm   Heart Rate (Exercise)  120 bpm  120 bpm  -  114 bpm  101 bpm   Heart  Rate (Exit)  98 bpm  107 bpm  -  102 bpm  100 bpm   Oxygen Saturation (Admit)  95 %  91 %  -  97 %  96 %   Oxygen Saturation (Exercise)  88 %  87 %  -  91 %  91 %   Oxygen Saturation (Exit)  94 %  93 %  -  95  %  94 %   Rating of Perceived Exertion (Exercise)  13  13  -  13  12   Perceived Dyspnea (Exercise)  1  1  -  2  1   Duration  Progress to 45 minutes of aerobic exercise without signs/symptoms of physical distress  Progress to 45 minutes of aerobic exercise without signs/symptoms of physical distress  -  Continue with 45 min of aerobic exercise without signs/symptoms of physical distress.  Continue with 45 min of aerobic exercise without signs/symptoms of physical distress.   Intensity  - 40-80%HRR  - 40-80%HRR  -  -  -     Progression   Progression  Continue to progress workloads to maintain intensity without signs/symptoms of physical distress.  Continue to progress workloads to maintain intensity without signs/symptoms of physical distress.  -  Continue to progress workloads to maintain intensity without signs/symptoms of physical distress.  Continue to progress workloads to maintain intensity without signs/symptoms of physical distress.     Resistance Training   Training Prescription  -  -  -  -  -   Weight  -  -  -  -  -   Reps  -  -  -  -  -   Time  -  -  -  -  -     Interval Training   Interval Training  -  -  -  No  No     Treadmill   MPH  2.8  2.8 decreased to 2.5 due to BP  -  2.8  2.8   Grade  3  3  -  3  3   Minutes  17  17  -  17  17     Bike   Level  1.3  1.3  -  1.3  1.4   Minutes  17  17  -  17  17     Rower   Level  3  3  -  4  4   Watts  30  41  -  51  55   Minutes  17  17  -  17  17     Home Exercise Plan   Plans to continue exercise at  -  -  [employer] (comment)  -  -   Frequency  -  -  Add 3 additional days to program exercise sessions.  -  -   Row Name 11/05/17 0900 11/17/17 0800           Response to Exercise   Blood Pressure (Admit)  112/80  118/80      Blood Pressure (Exercise)  148/84  168/54      Blood Pressure (Exit)  100/68  100/68      Heart Rate (Admit)  70 bpm  85 bpm      Heart Rate (Exercise)  101 bpm  123 bpm      Heart Rate  (Exit)  105 bpm  112 bpm      Oxygen Saturation (Admit)  95 %  93 %  Oxygen Saturation (Exercise)  92 %  91 %      Oxygen Saturation (Exit)  95 %  93 %      Rating of Perceived Exertion (Exercise)  13  14      Perceived Dyspnea (Exercise)  1  2      Duration  Continue with 45 min of aerobic exercise without signs/symptoms of physical distress.  Continue with 45 min of aerobic exercise without signs/symptoms of physical distress.        Progression   Progression  Continue to progress workloads to maintain intensity without signs/symptoms of physical distress.  Continue to progress workloads to maintain intensity without signs/symptoms of physical distress.        Interval Training   Interval Training  No  No        Oxygen   Oxygen  -  Continuous 2-3 liters      Liters  -  - 2-3 liters        Treadmill   MPH  2.8  3      Grade  3  4      Minutes  17  17        Bike   Level  1.4  -      Minutes  17  -        Elliptical   Level  -  2      Speed  -  1      Minutes  -  17        Rower   Level  4  5      Watts  49  62      Minutes  17  17         Exercise Comments: Exercise Comments    Row Name 10/13/17 0722           Exercise Comments  Home exercise completed          Exercise Goals and Review: Exercise Goals    [place] Name 09/28/17 1030             Exercise Goals   Increase Physical Activity  Yes       Intervention  Provide advice, education, support and counseling about physical activity/exercise needs.;Develop an individualized exercise prescription for aerobic and resistive training based on initial evaluation findings, risk stratification, comorbidities and participant's personal goals.       Expected Outcomes  Short Term: Attend rehab on a regular basis to increase amount of physical activity.       Increase Strength and Stamina  Yes       Intervention  Provide advice, education, support and counseling about physical activity/exercise needs.;Develop an  individualized exercise prescription for aerobic and resistive training based on initial evaluation findings, risk stratification, comorbidities and participant's personal goals.       Expected Outcomes  Short Term: Increase workloads from initial exercise prescription for resistance, speed, and METs.       Able to understand and use rate of perceived exertion (RPE) scale  Yes       Intervention  Provide education and explanation on how to use RPE scale       Expected Outcomes  Short Term: Able to use RPE daily in rehab to express subjective intensity level;Long Term:  Able to use RPE to guide intensity level when exercising independently       Able to understand and use Dyspnea scale  Yes  Intervention  Provide education and explanation on how to use Dyspnea scale       Expected Outcomes  Short Term: Able to use Dyspnea scale daily in rehab to express subjective sense of shortness of breath during exertion;Long Term: Able to use Dyspnea scale to guide intensity level when exercising independently       Knowledge and understanding of Target Heart Rate Range (THRR)  Yes       Intervention  Provide education and explanation of THRR including how the numbers were predicted and where they are located for reference       Expected Outcomes  Short Term: Able to state/look up THRR;Long Term: Able to use THRR to govern intensity when exercising independently;Short Term: Able to use daily as guideline for intensity in rehab       Understanding of Exercise Prescription  Yes       Intervention  Provide education, explanation, and written materials on patient's individual exercise prescription       Expected Outcomes  Short Term: Able to explain program exercise prescription;Long Term: Able to explain home exercise prescription to exercise independently          Exercise Goals Re-Evaluation : Exercise Goals Re-Evaluation    Row Name 10/26/17 0713 10/26/17 0724 11/20/17 1015         Exercise Goal  Re-Evaluation   Exercise Goals Review  Increase Physical Activity;Able to understand and use rate of perceived exertion (RPE) scale;Knowledge and understanding of Target Heart Rate Range (THRR);Understanding of Exercise Prescription;Increase Strength and Stamina;Able to understand and use Dyspnea scale  -  Increase Physical Activity;Able to understand and use rate of perceived exertion (RPE) scale;Knowledge and understanding of Target Heart Rate Range (THRR);Understanding of Exercise Prescription;Increase Strength and Stamina;Able to understand and use Dyspnea scale     Comments  Patient has attended 4 rehab sessions. Blood pressure was an issue in the beginning which has now been rectified by adding a new medication. Patient is able to work at an RPE of medium to somewhat hard. On the treadmill in particular, the patient needs 2 liters of oxygen. Patient has been out for the last week with the flu. Will monitor and progress as able when the patient returns to rehab.  -  Patient is progressing well in program. Dr. Posey Pronto has approved patient to work on High Intensity Interval Protocol. Has advanced to elliptical-progressing his time. Has lost 20 pounds since 10/06/17.     Expected Outcomes  -  Through exercise at rehab and at home, patient will increase strength and stamina. The patient will also be able to meet his weight loss goal with more exercise days during the week.   Through exercise at rehab and at home, patient will increase strength and stamina. The patient will also be able to meet his weight loss goal with more exercise days during the week.         Discharge Exercise Prescription (Final Exercise Prescription Changes): Exercise Prescription Changes - 11/17/17 0800      Response to Exercise   Blood Pressure (Admit)  118/80    Blood Pressure (Exercise)  168/54    Blood Pressure (Exit)  100/68    Heart Rate (Admit)  85 bpm    Heart Rate (Exercise)  123 bpm    Heart Rate (Exit)  112 bpm     Oxygen Saturation (Admit)  93 %    Oxygen Saturation (Exercise)  91 %    Oxygen Saturation (Exit)  93 %  Rating of Perceived Exertion (Exercise)  14    Perceived Dyspnea (Exercise)  2    Duration  Continue with 45 min of aerobic exercise without signs/symptoms of physical distress.      Progression   Progression  Continue to progress workloads to maintain intensity without signs/symptoms of physical distress.      Interval Training   Interval Training  No      Oxygen   Oxygen  Continuous 2-3 liters    Liters  -- 2-3 liters      Treadmill   MPH  3    Grade  4    Minutes  17      Bike   Level  --    Minutes  --      Elliptical   Level  2    Speed  1    Minutes  17      Rower   Level  5    Watts  62    Minutes  17       Nutrition:  Target Goals: Understanding of nutrition guidelines, daily intake of sodium '1500mg'$ , cholesterol '200mg'$ , calories 30% from fat and 7% or less from saturated fats, daily to have 5 or more servings of fruits and vegetables.  Biometrics: Pre Biometrics - 09/28/17 1135      Pre Biometrics   Grip Strength  50 kg        Nutrition Therapy Plan and Nutrition Goals: Nutrition Therapy & Goals - 10/09/17 1406      Nutrition Therapy   Diet  General, healthful      Personal Nutrition Goals   Nutrition Goal  Identify food quantities necessary to achieve wt loss of  -2# per week to a goal wt loss of 2.7-10.9 kg (6-24 lb) at graduation from pulmonary rehab.      Intervention Plan   Intervention  Prescribe, educate and counsel regarding individualized specific dietary modifications aiming towards targeted core components such as weight, hypertension, lipid management, diabetes, heart failure and other comorbidities.    Expected Outcomes  Short Term Goal: Understand basic principles of dietary content, such as calories, fat, sodium, cholesterol and nutrients.;Long Term Goal: Adherence to prescribed nutrition plan.       Nutrition  Assessments: Nutrition Assessments - 10/09/17 1403      Rate Your Plate Scores   Pre Score  60       Nutrition Goals Re-Evaluation:   Nutrition Goals Discharge (Final Nutrition Goals Re-Evaluation):   Psychosocial: Target Goals: Acknowledge presence or absence of significant depression and/or stress, maximize coping skills, provide positive support system. Participant is able to verbalize types and ability to use techniques and skills needed for reducing stress and depression.  Initial Review & Psychosocial Screening: Initial Psych Review & Screening - 09/28/17 1117      Initial Review   Current issues with  None Identified      Family Dynamics   Good Support System?  Yes      Barriers   Psychosocial barriers to participate in program  There are no identifiable barriers or psychosocial needs.;The patient should benefit from training in stress management and relaxation.      Screening Interventions   Interventions  Encouraged to exercise       Quality of Life Scores:  Scores of 19 and below usually indicate a poorer quality of life in these areas.  A difference of  2-3 points is a clinically meaningful difference.  A difference of 2-3 points  in the total score of the Quality of Life Index has been associated with significant improvement in overall quality of life, self-image, physical symptoms, and general health in studies assessing change in quality of life.   PHQ-9: Recent Review Flowsheet Data    Depression screen [facility] 2/9 09/28/2017 05/21/2017   Decreased Interest 0 0   Down, Depressed, Hopeless 0 0   PHQ - 2 Score 0 0     Interpretation of Total Score  Total Score Depression Severity:  1-4 = Minimal depression, 5-9 = Mild depression, 10-14 = Moderate depression, 15-19 = Moderately severe depression, 20-27 = Severe depression   Psychosocial Evaluation and Intervention:   Psychosocial Re-Evaluation: Psychosocial Re-Evaluation    Row Name 09/28/17 1120 10/26/17  0852 11/23/17 0847         Psychosocial Re-Evaluation   Current issues with  None Identified  Current Stress Concerns;Current Sleep Concerns  -     Comments  -  patient was not sleeping well with his oxygen but that has been resolved with humidification. he continues to have multiple stress issues at work that he shares with the pulmonary rehab staff but feels he is able to deal with the stressors in a healthy way currently.  patient states stress and sleep issues have resolved     Expected Outcomes  patient will remain free from psychosocial barriers to participation in pulmonary rehab  patient will remain free from psychosocial barriers to participation in pulmonary rehab  patient will remain free from psychosocial barriers to participation in pulmonary rehab     Interventions  -  Encouraged to attend Pulmonary Rehabilitation for the exercise  Encouraged to attend Pulmonary Rehabilitation for the exercise     Continue Psychosocial Services   Follow up required by staff  Follow up required by staff  Follow up required by staff       Initial Review   Source of Stress Concerns  -  Occupation  -        Psychosocial Discharge (Final Psychosocial Re-Evaluation): Psychosocial Re-Evaluation - 11/23/17 0847      Psychosocial Re-Evaluation   Comments  patient states stress and sleep issues have resolved    Expected Outcomes  patient will remain free from psychosocial barriers to participation in pulmonary rehab    Interventions  Encouraged to attend Pulmonary Rehabilitation for the exercise    Continue Psychosocial Services   Follow up required by staff       Education: Education Goals: Education classes will be provided on a weekly basis, covering required topics. Participant will state understanding/return demonstration of topics presented.  Learning Barriers/Preferences: Learning Barriers/Preferences - 09/28/17 1110      Learning Barriers/Preferences   Learning Barriers  None     Learning Preferences  Written Material;Computer/Internet;Group Instruction       Education Topics: Risk Factor Reduction:  -Group instruction that is supported by a PowerPoint presentation. Instructor discusses the definition of a risk factor, different risk factors for pulmonary disease, and how the heart and lungs work together.     Nutrition for Pulmonary Patient:  -Group instruction provided by PowerPoint slides, verbal discussion, and written materials to support subject matter. The instructor gives an explanation and review of healthy diet recommendations, which includes a discussion on weight management, recommendations for fruit and vegetable consumption, as well as protein, fluid, caffeine, fiber, sodium, sugar, and alcohol. Tips for eating when patients are short of breath are discussed.   Pursed Lip Breathing:  -Group instruction that is  supported by demonstration and informational handouts. Instructor discusses the benefits of pursed lip and diaphragmatic breathing and detailed demonstration on how to preform both.     Oxygen Safety:  -Group instruction provided by PowerPoint, verbal discussion, and written material to support subject matter. There is an overview of "What is Oxygen" and "Why do we need it".  Instructor also reviews how to create a safe environment for oxygen use, the importance of using oxygen as prescribed, and the risks of noncompliance. There is a brief discussion on traveling with oxygen and resources the patient may utilize.   Oxygen Equipment:  -Group instruction provided by [facility] Staff utilizing handouts, written materials, and equipment demonstrations.   Signs and Symptoms:  -Group instruction provided by written material and verbal discussion to support subject matter. Warning signs and symptoms of infection, stroke, and heart attack are reviewed and when to call the physician/911 reinforced. Tips for preventing the spread of infection  discussed.   Advanced Directives:  -Group instruction provided by verbal instruction and written material to support subject matter. Instructor reviews Advanced Directive laws and proper instruction for filling out document.   Pulmonary Video:  -Group video education that reviews the importance of medication and oxygen compliance, exercise, good nutrition, pulmonary hygiene, and pursed lip and diaphragmatic breathing for the pulmonary patient.   Exercise for the Pulmonary Patient:  -Group instruction that is supported by a PowerPoint presentation. Instructor discusses benefits of exercise, core components of exercise, frequency, duration, and intensity of an exercise routine, importance of utilizing pulse oximetry during exercise, safety while exercising, and options of places to exercise outside of rehab.     Pulmonary Medications:  -Verbally interactive group education provided by instructor with focus on inhaled medications and proper administration.   Anatomy and Physiology of the Respiratory System and Intimacy:  -Group instruction provided by PowerPoint, verbal discussion, and written material to support subject matter. Instructor reviews respiratory cycle and anatomical components of the respiratory system and their functions. Instructor also reviews differences in obstructive and restrictive respiratory diseases with examples of each. Intimacy, Sex, and Sexuality differences are reviewed with a discussion on how relationships can change when diagnosed with pulmonary disease. Common sexual concerns are reviewed.   MD DAY -A group question and answer session with a medical doctor that allows participants to ask questions that relate to their pulmonary disease state.   OTHER EDUCATION -Group or individual verbal, written, or video instructions that support the educational goals of the pulmonary rehab program.   Holiday Eating Survival Tips:  -Group instruction provided by  PowerPoint slides, verbal discussion, and written materials to support subject matter. The instructor gives patients tips, tricks, and techniques to help them not only survive but enjoy the holidays despite the onslaught of food that accompanies the holidays.   Knowledge Questionnaire Score: Knowledge Questionnaire Score - 09/30/17 1422      Knowledge Questionnaire Score   Pre Score  16/18       Core Components/Risk Factors/Patient Goals at Admission: Personal Goals and Risk Factors at Admission - 09/28/17 1117      Core Components/Risk Factors/Patient Goals on Admission    Weight Management  Yes;Obesity    Intervention  Obesity: Provide education and appropriate resources to help participant work on and attain dietary goals.;Weight Management/Obesity: Establish reasonable short term and long term weight goals.;Weight Management: Provide education and appropriate resources to help participant work on and attain dietary goals.;Weight Management: Develop a combined nutrition and exercise program designed to  reach desired caloric intake, while maintaining appropriate intake of nutrient and fiber, sodium and fats, and appropriate energy expenditure required for the weight goal.    Expected Outcomes  Understanding of distribution of calorie intake throughout the day with the consumption of 4-5 meals/snacks;Understanding recommendations for meals to include 15-35% energy as protein, 25-35% energy from fat, 35-60% energy from carbohydrates, less than '200mg'$  of dietary cholesterol, 20-35 gm of total fiber daily;Weight Loss: Understanding of general recommendations for a balanced deficit meal plan, which promotes 1-2 lb weight loss per week and includes a negative energy balance of [phone] kcal/d    Improve shortness of breath with ADL's  Yes    Intervention  Provide education, individualized exercise plan and daily activity instruction to help decrease symptoms of SOB with activities of daily living.     Expected Outcomes  Short Term: Improve cardiorespiratory fitness to achieve a reduction of symptoms when performing ADLs;Long Term: Be able to perform more ADLs without symptoms or delay the onset of symptoms       Core Components/Risk Factors/Patient Goals Review:  Goals and Risk Factor Review    Row Name 10/23/17 0825 11/23/17 0831           Core Components/Risk Factors/Patient Goals Review   Personal Goals Review  Weight Management/Obesity;Improve shortness of breath with ADL's;Develop more efficient breathing techniques such as purse lipped breathing and diaphragmatic breathing and practicing self-pacing with activity.;Hypertension;Stress  Weight Management/Obesity;Improve shortness of breath with ADL's;Develop more efficient breathing techniques such as purse lipped breathing and diaphragmatic breathing and practicing self-pacing with activity.;Hypertension;Stress      Review  Patient has attended 4 sessions since admission. He has been absent the last two session related to diagnosis of the flu. It is too soon in the program for him to see improvement in his shortness of breath wiith ADLs however there have been several barriers to progression identified. During his first 2 sessions it was identified that he was hypertensive at rest and with exercise. 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 He continues to be coached on [place] however he most recently was observed using the breathing technique without promptimg. He has met with the department RD to discuss weightloss in preparation with meeting the lung transplant team. Admission weight 121.9, weight 10/15/17 120.7  Patient is doing extremely well in pulmonary rehab. His weight is down 9kg since  admission. he continues to tolerate workload increases and states his shortness of breath has really improved with utilizing oxygen and PLB. His BP has decreased significantly with medication and weight loss and he is learning to manage stress more effectively. he states he is really excited about the progress he has made which makes him more motivated. anticipate continued progress over      Expected Outcomes  see admission expected outcomes.  see admission expected outcomes.         Core Components/Risk Factors/Patient Goals at Discharge (Final Review):  Goals and Risk Factor Review - 11/23/17 0831      Core Components/Risk Factors/Patient Goals Review   Personal Goals Review  Weight Management/Obesity;Improve shortness of breath with ADL's;Develop more efficient breathing techniques such as purse lipped breathing and diaphragmatic breathing and practicing self-pacing with activity.;Hypertension;Stress    Review  Patient is doing extremely well in  pulmonary rehab. His weight is down 9kg since admission. he continues to tolerate workload increases and states his shortness of breath has really improved with utilizing oxygen and PLB. His BP has decreased significantly with medication and weight loss and he is learning to manage stress more effectively. he states he is really excited about the progress he has made which makes him more motivated. anticipate continued progress over    Expected Outcomes  see admission expected outcomes.       ITP Comments: ITP Comments    Row Name 11/03/17 0929           ITP Comments  -          Comments: Patient has attended 11 sessions since admission.

## 2017-11-24 ENCOUNTER — Encounter (HOSPITAL_COMMUNITY)
Admission: RE | Admit: 2017-11-24 | Discharge: 2017-11-24 | Disposition: A | Discharge status: Home / Self Care | Payer: Medicare Other | Source: Ambulatory Visit | Attending: Pulmonary Disease | Admitting: Pulmonary Disease

## 2017-11-24 DIAGNOSIS — J8410 Pulmonary fibrosis, unspecified: Secondary | ICD-10-CM | POA: Diagnosis not present

## 2017-11-24 NOTE — Progress Notes (Signed)
Daily Session Note  Patient Details  Name: Alex Atkinson MRN: [id number] Date of Birth: 1951-08-07 Referring Provider:     Pulmonary Rehab Walk Test from 09/29/2017 in [place]  Referring Provider  Dr. Nelda Marseille      Encounter Date: 11/24/2017  Check In: Session Check In - 11/24/17 0710      Check-In   Location  [facility]    Staff Present  Su Hilt, MS, ACSM RCEP, Exercise Physiologist;Portia Rollene Rotunda, RN, BSN    Supervising physician immediately available to respond to emergencies  [facility] immediately available    Physician(s)  Dr. Grandville Silos    Medication changes reported      No    Fall or balance concerns reported     No    Tobacco Cessation  No Change    Warm-up and Cool-down  Performed as group-led instruction    Resistance Training Performed  No    VAD Patient?  No      Pain Assessment   Currently in Pain?  No/denies    Multiple Pain Sites  No       Capillary Blood Glucose: No results found for this or any previous visit (from the past 24 hour(s)).    Social History   Tobacco Use  Smoking Status Never Smoker  Smokeless Tobacco Never Used    Goals Met:  Achieving weight loss Personal goals reviewed Queuing for purse lip breathing  Goals Unmet:  Not Applicable  Comments: Service time is from 6:45a to 8:05a    Dr. Rush Farmer is Medical Director for Pulmonary Rehab at [facility].

## 2017-11-26 ENCOUNTER — Encounter (HOSPITAL_COMMUNITY)
Admission: RE | Admit: 2017-11-26 | Discharge: 2017-11-26 | Disposition: A | Discharge status: Home / Self Care | Payer: Medicare Other | Source: Ambulatory Visit | Attending: Pulmonary Disease | Admitting: Pulmonary Disease

## 2017-11-26 DIAGNOSIS — J8410 Pulmonary fibrosis, unspecified: Secondary | ICD-10-CM | POA: Diagnosis not present

## 2017-11-26 NOTE — Progress Notes (Signed)
Daily Session Note  Patient Details  Name: Alex Atkinson MRN: [id number] Date of Birth: [birth year] Referring Provider:     Pulmonary Rehab Walk Test from 09/29/2017 in [place]  Referring Provider  Dr. Nelda Marseille      Encounter Date: 11/26/2017  Check In: Session Check In - 11/26/17 0712      Check-In   Location  [facility]    Staff Present  Su Hilt, MS, ACSM RCEP, Exercise Physiologist;Portia Rollene Rotunda, RN, BSN    Supervising physician immediately available to respond to emergencies  [facility] immediately available    Physician(s)  Dr. Wendee Beavers    Medication changes reported      No    Fall or balance concerns reported     No    Tobacco Cessation  No Change    Warm-up and Cool-down  Performed as group-led instruction    Resistance Training Performed  Yes    VAD Patient?  No      Pain Assessment   Currently in Pain?  No/denies    Multiple Pain Sites  No       Capillary Blood Glucose: No results found for this or any previous visit (from the past 24 hour(s)).    Social History   Tobacco Use  Smoking Status Never Smoker  Smokeless Tobacco Never Used    Goals Met:  Exercise tolerated well No report of cardiac concerns or symptoms Strength training completed today  Goals Unmet:  Not Applicable  Comments: Service time is from 6:45a to 8:05a    Dr. Rush Farmer is Medical Director for Pulmonary Rehab at [facility].

## 2017-12-01 ENCOUNTER — Encounter (HOSPITAL_COMMUNITY): Payer: Medicare Other

## 2017-12-01 ENCOUNTER — Encounter (HOSPITAL_COMMUNITY): Payer: Self-pay

## 2017-12-03 ENCOUNTER — Encounter (HOSPITAL_COMMUNITY): Payer: Medicare Other

## 2017-12-08 ENCOUNTER — Encounter (HOSPITAL_COMMUNITY)
Admission: RE | Admit: 2017-12-08 | Discharge: 2017-12-08 | Disposition: A | Discharge status: Home / Self Care | Payer: Medicare Other | Source: Ambulatory Visit | Attending: Pulmonary Disease | Admitting: Pulmonary Disease

## 2017-12-08 DIAGNOSIS — J8410 Pulmonary fibrosis, unspecified: Secondary | ICD-10-CM | POA: Diagnosis not present

## 2017-12-08 NOTE — Progress Notes (Signed)
Daily Session Note  Patient Details  Name: Alex Atkinson MRN: [id number] Date of Birth: September 22, 1950 Referring Provider:     Pulmonary Rehab Walk Test from 09/29/2017 in [place]  Referring Provider  Dr. Nelda Marseille      Encounter Date: 12/08/2017  Check In: Session Check In - 12/08/17 0653      Check-In   Location  [facility]    Staff Present  Su Hilt, MS, ACSM RCEP, Exercise Physiologist;Annedrea Rosezella [place], RN, [facility]    Supervising physician immediately available to respond to emergencies  [facility] immediately available    Physician(s)  Dr. Maylene Roes    Medication changes reported      No    Fall or balance concerns reported     No    Tobacco Cessation  No Change    Warm-up and Cool-down  Performed as group-led instruction    Resistance Training Performed  No    VAD Patient?  No      Pain Assessment   Currently in Pain?  No/denies    Multiple Pain Sites  No       Capillary Blood Glucose: No results found for this or any previous visit (from the past 24 hour(s)).    Social History   Tobacco Use  Smoking Status Never Smoker  Smokeless Tobacco Never Used    Goals Met:  Exercise tolerated well No report of cardiac concerns or symptoms Strength training completed today  Goals Unmet:  Not Applicable  Comments: Service time is from 6:45a to 8:00am    Dr. Rush Farmer is Medical Director for Pulmonary Rehab at [facility].

## 2017-12-10 ENCOUNTER — Encounter (HOSPITAL_COMMUNITY)
Admission: RE | Admit: 2017-12-10 | Discharge: 2017-12-10 | Disposition: A | Discharge status: Home / Self Care | Payer: Medicare Other | Source: Ambulatory Visit | Attending: Pulmonary Disease | Admitting: Pulmonary Disease

## 2017-12-10 DIAGNOSIS — Z5112 Encounter for antineoplastic immunotherapy: Secondary | ICD-10-CM | POA: Diagnosis not present

## 2017-12-10 DIAGNOSIS — M318 Other specified necrotizing vasculopathies: Secondary | ICD-10-CM | POA: Diagnosis not present

## 2017-12-10 DIAGNOSIS — J8489 Other specified interstitial pulmonary diseases: Secondary | ICD-10-CM | POA: Diagnosis not present

## 2017-12-10 DIAGNOSIS — J8410 Pulmonary fibrosis, unspecified: Secondary | ICD-10-CM

## 2017-12-10 DIAGNOSIS — I776 Arteritis, unspecified: Secondary | ICD-10-CM | POA: Diagnosis not present

## 2017-12-10 NOTE — Progress Notes (Signed)
Daily Session Note  Patient Details  Name: JORAM VENSON MRN: [id number] Date of Birth: 07-Jan-1951 Referring Provider:     Pulmonary Rehab Walk Test from 09/29/2017 in [place]  Referring Provider  Dr. Nelda Marseille      Encounter Date: 12/10/2017  Check In: Session Check In - 12/10/17 0715      Check-In   Location  [facility]    Staff Present  Su Hilt, MS, ACSM RCEP, Exercise Physiologist;Annedrea Rosezella [place], RN, [facility]    Supervising physician immediately available to respond to emergencies  [facility] immediately available    Physician(s)  Dr. Maylene Roes    Medication changes reported      No    Fall or balance concerns reported     No    Tobacco Cessation  No Change    Warm-up and Cool-down  Performed as group-led instruction    Resistance Training Performed  Yes    VAD Patient?  No      Pain Assessment   Currently in Pain?  No/denies    Multiple Pain Sites  No       Capillary Blood Glucose: No results found for this or any previous visit (from the past 24 hour(s)).    Social History   Tobacco Use  Smoking Status Never Smoker  Smokeless Tobacco Never Used    Goals Met:  Exercise tolerated well No report of cardiac concerns or symptoms Strength training completed today  Goals Unmet:  Not Applicable  Comments: Service time is from 6:45a to 8:00a    Dr. Rush Farmer is Medical Director for Pulmonary Rehab at [facility].

## 2017-12-11 ENCOUNTER — Encounter: Payer: Self-pay | Admitting: Neurology

## 2017-12-11 ENCOUNTER — Ambulatory Visit (INDEPENDENT_AMBULATORY_CARE_PROVIDER_SITE_OTHER): Payer: Medicare Other | Admitting: Neurology

## 2017-12-11 VITALS — BP 120/78 | HR 70 | Ht 72.0 in | Wt 252.1 lb

## 2017-12-11 DIAGNOSIS — G619 Inflammatory polyneuropathy, unspecified: Secondary | ICD-10-CM

## 2017-12-11 DIAGNOSIS — M317 Microscopic polyangiitis: Secondary | ICD-10-CM | POA: Diagnosis not present

## 2017-12-11 MED ORDER — GABAPENTIN 300 MG PO CAPS: 300.0000 mg | ORAL_CAPSULE | Freq: Two times a day (BID) | ORAL | 3 refills | Status: DC

## 2017-12-11 NOTE — Patient Instructions (Addendum)
Continue gabapentin 300mg  twice daily  Keep up with daily foot exam  Take a collapsible cane to use on any uneven surfaces (grass, gravel, sand)  Use water shoes when at the beach   Return to clinic in 4 months

## 2017-12-11 NOTE — Progress Notes (Signed)
Follow-up Visit   Date: 12/11/17    Alex Atkinson MRN: [id number] DOB: 1951/08/07   Interim History: Alex Atkinson is a 67 y.o. right-handed Caucasian Animal nutritionist with history of interstitial lung disease and microscopic polyangiitis returning to the clinic for follow-up of vasculitis neuropathy.  The patient was accompanied to the clinic by self.  History of present illness: Patient was in his usual state of health until several years ago and then developed a chronic cough.  In July 2018, he was hiking in [place], [place] and was unable to keep up with his wife while hiking because of fatigue and shortness of breath.  By August, he was having headaches, generalizes muscle aches, and joint pain.  Initially, there was treated for community acquired pneumonia as well as [facility] spotted fever, but this diagnosis was later retracted by [name], Infectious Disease who he saw in October.  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 CT chest showed ground glass attenuation.    By October, he had lost 20lb and started having tingling of the feet and numbness.  He has constant tingling/numbness over the toes, soles, and extending into the dorsum of the feet, worse on the left.  He has intermittent sharp pain and burning sensation. He denies any weakness, imbalance, or falls.  He tends to be very careful when walking because tactile stimuli can trigger tingling pain of the feet.  On one occasion, he was unable to locate his phone which was ultimately found to be in his shoe on vibrate, but he could not feel it.  There is no severe nerve pain that preceded these symptoms.  He takes gabapentin 3104m at bedtime which helps.  He also has very rare spells of numbness over the fingertips.  He  denies any weakness of the hands.   He was evaluated by Dr. BAmil Amenwho diagnosed him with microscopic polyangiitis.  He completed weekly Rituxan treatments in November 2018 and remains of tapering dose of prednisone 454mwith plans for 56m20maper every 2 weeks.  His treatment has helped generalized myalgias and malaise.   Despite this, he continues to feel that his breathing and neuropathy has not stabilized, and continue to progress.     He denies any new headaches, vision changes, facial paresthesias, or dysphagia/dysarthria.    He drinks alcohol only occasionally.  No family history of neuropathy or personal history of diabetes.   UPDATE 12/11/2017:  He is here for 4 month follow-up visit and reports doing well since starting cellcept 1500m34mice which has helped his breathing as well as overall functioning.  He continues to be on rituximab and tapering dose of prednisone which is down to 20mg66mly. He reports being much more active and is trying to loose weight and despite being on prednisone, he has lost 25lb.  He continues to have numbness of the feet which is no worse than before.  He denies painful or tingling paresthesia of the feet.  Balance is doing much better.  He continues to walk unassisted and has not suffered any falls.    Medications:  Current Outpatient Medications on File Prior to Visit  Medication Sig Dispense Refill  . calcium citrate-vitamin D (CITRACAL+D) 315-200 MG-UNIT tablet Take 1 tablet by mouth 2 (two) times daily.    .Marland Kitchen  mycophenolate (CELLCEPT) 500 MG tablet Take 1,500 mg by mouth.     . pantoprazole (PROTONIX) 40 MG tablet Take 30- 60 min before your first and last meals of the day (Patient taking differently: Take 40 mg by mouth daily before breakfast. Take 30- 60 min before your first and last meals of the day) 60 tablet 2  . predniSONE (DELTASONE) 10 MG tablet Take 4 tablets (40 mg total) by mouth daily with breakfast. (Patient taking differently: Take 20 mg by  mouth daily with breakfast. 2.5 tablets.) 120 tablet 1  . sulfamethoxazole-trimethoprim (BACTRIM DS,SEPTRA DS) 800-160 MG tablet Take 1 tablet by mouth every Tuesday, Thursday, and Saturday at 6 PM. In the morning.    . Turmeric 500 MG TABS Take 1,000 mg by mouth at bedtime.      No current facility-administered medications on file prior to visit.     Allergies: No Known Allergies  Review of Systems:  CONSTITUTIONAL: No fevers, chills, night sweats, or weight loss.  EYES: No visual changes or eye pain ENT: No hearing changes.  No history of nose bleeds.   RESPIRATORY: +cough, wheezing +shortness of breath.   CARDIOVASCULAR: Negative for chest pain, and palpitations.   GI: Negative for abdominal discomfort, blood in stools or black stools.  No recent change in bowel habits.   GU:  No history of incontinence.   MUSCLOSKELETAL: No history of joint pain or swelling.  No myalgias.   SKIN: Negative for lesions, rash, and itching.   ENDOCRINE: Negative for cold or heat intolerance, polydipsia or goiter.   PSYCH:  No depression or anxiety symptoms.   NEURO: As Above.   Vital Signs:  BP 120/78   Pulse 70   Ht 6' (1.829 m)   Wt 252 lb 2 oz (114.4 kg)   SpO2 93%   BMI 34.19 kg/m    General: Well-appearing, comfortable  Neurological Exam: MENTAL STATUS including orientation to time, place, person, recent and remote memory, attention span and concentration, language, and fund of knowledge is normal.  Speech is not dysarthric.  CRANIAL NERVES: Pupils equal round and reactive to light.  Normal conjugate, extra-ocular eye movements in all directions of gaze.  No ptosis.  Face is symmetric. Palate elevates symmetrically.  Tongue is midline.  MOTOR:  Mild bilateral gastrocnemius atrophy, no fasciculations or abnormal movements.  No pronator drift.  Tone is normal.    Right Upper Extremity:    Left Upper Extremity:    Deltoid  5/5   Deltoid  5/5   Biceps  5/5   Biceps  5/5   Triceps  5/5    Triceps  5/5   Wrist extensors  5/5   Wrist extensors  5/5   Wrist flexors  5/5   Wrist flexors  5/5   Finger extensors  5/5   Finger extensors  5/5   Finger flexors  5/5   Finger flexors  5/5   Dorsal interossei  5/5   Dorsal interossei  5/5   Abductor pollicis  5/5   Abductor pollicis  5/5   Tone (Ashworth scale)  0  Tone (Ashworth scale)  0   Right Lower Extremity:    Left Lower Extremity:    Hip flexors  5/5   Hip flexors  5/5   Hip extensors  5/5   Hip extensors  5/5   Knee flexors  5/5   Knee flexors  5/5   Knee extensors  5/5   Knee extensors  5/5  Dorsiflexors  5/5   Dorsiflexors  5/5   Plantarflexors  5/5   Plantarflexors  5/5   Toe extensors  5-/5   Toe extensors  4/5   Toe flexors  4+/5   Toe flexors  4/5   Tone (Ashworth scale)  0  Tone (Ashworth scale)  0    MSRs:  Reflexes are 2+/4 throughout, except absent at the Achilles.  SENSORY:  Vibration is 100% at the knees and ankles, trace at the great toe (improved).  Temperature and pin prick reduced distal to ankles bilaterally.  Proprioception is intact at the great toe bilaterally.  Rhomberg testing is negative (improved)  COORDINATION/GAIT:  Normal finger-to- nose-finger.  Intact rapid alternating movements bilaterally.  Gait narrow based and stable. Tandem gait is intact  Data: Labs 05/21/2017:  p-ANCA 1:320*, MPO antibody 25.7*, aldolase 4.5, dsDNA neg, ANA neg, C3/4 neg,   CT chest 08/21/2017: 1. 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 Findings are technically indeterminate for usual interstitial pneumonia (UIP), although this is not strongly favored given the absence of a clear basilar gradient, the absence of frank honeycombing and the previously noted perilobular consolidation. Recommend a follow-up high-resolution chest CT in 12 months to assess ongoing temporal pattern stability.  2.  Stable subpleural 8 mm right middle lobe pulmonary nodule, for which 3 month stability has been demonstrated, probably benign. This nodule can be reassessed on follow-up chest CT in 12 months. 3. Two vessel coronary atherosclerosis.  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  There are no signs of neuropathy or entrapment neuropathy in the left upper extremity.   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  He has minimal pain which is well-controlled on gabapentin 332m twice daily.  He predominately has numbness which unfortunately there are no medications available for.  Again, I reminded him of daily foot inspection.  Fall precautions were again discussed and he was encouraged to use a cane when on uneven surfaces.  Continue medical management of MPA as per pulmonology and rheumatology.   Return to clinic in 4 months   Thank you for allowing me to participate in patient's care.  If I can answer any additional questions, I would be pleased to do so.    Sincerely,    Donika K. PPosey Pronto DO

## 2017-12-15 ENCOUNTER — Encounter (HOSPITAL_COMMUNITY)
Admission: RE | Admit: 2017-12-15 | Discharge: 2017-12-15 | Disposition: A | Discharge status: Home / Self Care | Payer: Medicare Other | Source: Ambulatory Visit | Attending: Pulmonary Disease | Admitting: Pulmonary Disease

## 2017-12-15 VITALS — Wt 247.6 lb

## 2017-12-15 DIAGNOSIS — J8410 Pulmonary fibrosis, unspecified: Secondary | ICD-10-CM

## 2017-12-15 NOTE — Progress Notes (Signed)
Daily Session Note  Patient Details  Name: Alex Atkinson MRN: [id number] Date of Birth: 1951-01-31 Referring Provider:     Pulmonary Rehab Walk Test from 09/29/2017 in [facility]  Referring Provider  Dr. Nelda Marseille      Encounter Date: 12/15/2017  Check In: Session Check In - 12/15/17 0701      Check-In   Location  [facility]    Staff Present  Su Hilt, MS, ACSM RCEP, Exercise Physiologist;Portia Rollene Rotunda, RN, BSN    Supervising physician immediately available to respond to emergencies  [facility] immediately available    Physician(s)  Dr. Maylene Roes    Medication changes reported      No    Fall or balance concerns reported     No    Tobacco Cessation  No Change    Warm-up and Cool-down  Performed as group-led instruction    Resistance Training Performed  No    VAD Patient?  No      Pain Assessment   Currently in Pain?  No/denies    Multiple Pain Sites  No       Capillary Blood Glucose: No results found for this or any previous visit (from the past 24 hour(s)).  Exercise Prescription Changes - 12/15/17 0800      Response to Exercise   Blood Pressure (Admit)  110/66    Blood Pressure (Exercise)  148/84    Blood Pressure (Exit)  100/80    Heart Rate (Admit)  86 bpm    Heart Rate (Exercise)  109 bpm    Heart Rate (Exit)  85 bpm    Oxygen Saturation (Admit)  91 %    Oxygen Saturation (Exercise)  90 %    Oxygen Saturation (Exit)  96 %    Rating of Perceived Exertion (Exercise)  12    Perceived Dyspnea (Exercise)  1    Duration  Continue with 45 min of aerobic exercise without signs/symptoms of physical distress.      Progression   Progression  Continue to progress workloads to maintain intensity without signs/symptoms of physical distress.      Interval Training   Interval Training  Yes      Oxygen   Oxygen  Continuous    Liters  3      Treadmill   MPH  3    Grade  10    Minutes  17      Bike   Level   2    Minutes  17      NuStep   Level  3    SPM  80    Minutes  17    METs  1.7      Elliptical   Level  --    Speed  --    Minutes  --       Social History   Tobacco Use  Smoking Status Never Smoker  Smokeless Tobacco Never Used    Goals Met:  Exercise tolerated well No report of cardiac concerns or symptoms Strength training completed today  Goals Unmet:  Not Applicable  Comments: Service time is from 6:45a to 8:00a    Dr. Rush Farmer is Medical Director for Pulmonary Rehab at [facility].

## 2017-12-17 ENCOUNTER — Encounter (HOSPITAL_COMMUNITY)
Admission: RE | Admit: 2017-12-17 | Discharge: 2017-12-17 | Disposition: A | Discharge status: Home / Self Care | Payer: Medicare Other | Source: Ambulatory Visit | Attending: Pulmonary Disease | Admitting: Pulmonary Disease

## 2017-12-17 DIAGNOSIS — J8410 Pulmonary fibrosis, unspecified: Secondary | ICD-10-CM | POA: Diagnosis not present

## 2017-12-17 NOTE — Progress Notes (Signed)
Daily Session Note  Patient Details  Name: Alex Atkinson MRN: [id number] Date of Birth: 06/08/1951 Referring Provider:     Pulmonary Rehab Walk Test from 09/29/2017 in [facility]  Referring Provider  Dr. Yacoub      Encounter Date: 12/17/2017  Check In: Session Check In - 12/17/17 0655      Check-In   Location  [facility]    Staff Present  Molly DiVincenzo, MS, ACSM RCEP, Exercise Physiologist;Portia Payne, RN, BSN    Supervising physician immediately available to respond to emergencies  [facility] immediately available    Physician(s)  Dr. AMIN    Medication changes reported      No    Fall or balance concerns reported     No    Tobacco Cessation  No Change    Warm-up and Cool-down  Performed as group-led instruction    Resistance Training Performed  No    VAD Patient?  No      Pain Assessment   Currently in Pain?  No/denies    Multiple Pain Sites  No       Capillary Blood Glucose: No results found for this or any previous visit (from the past 24 hour(s)).    Social History   Tobacco Use  Smoking Status Never Smoker  Smokeless Tobacco Never Used    Goals Met:  Independence with exercise equipment Improved SOB with ADL's Using PLB without cueing & demonstrates good technique Achieving weight loss Changing diet to healthy choices, watching portion sizes Exercise tolerated well No report of cardiac concerns or symptoms Strength training completed today  Goals Unmet:  Not Applicable  Comments: Service time is from 0645 to 0800   Dr. Wesam G. Yacoub is Medical Director for Pulmonary Rehab at [facility]. 

## 2017-12-22 ENCOUNTER — Encounter (HOSPITAL_COMMUNITY)
Admission: RE | Admit: 2017-12-22 | Discharge: 2017-12-22 | Disposition: A | Discharge status: Home / Self Care | Payer: Medicare Other | Source: Ambulatory Visit | Attending: Pulmonary Disease | Admitting: Pulmonary Disease

## 2017-12-22 DIAGNOSIS — J8410 Pulmonary fibrosis, unspecified: Secondary | ICD-10-CM

## 2017-12-22 NOTE — Progress Notes (Signed)
Daily Session Note  Patient Details  Name: Alex Atkinson MRN: [id number] Date of Birth: [birth year] Referring Provider:     Pulmonary Rehab Walk Test from 09/29/2017 in [place]  Referring Provider  Dr. Nelda Marseille      Encounter Date: 12/22/2017  Check In: Session Check In - 12/22/17 0645      Check-In   Location  [facility]    Staff Present  Su Hilt, MS, ACSM RCEP, Exercise Physiologist;Linsey Hirota Rollene Rotunda, RN, BSN    Supervising physician immediately available to respond to emergencies  [facility] immediately available    Physician(s)  Dr. Reesa Chew    Medication changes reported      No    Fall or balance concerns reported     No    Tobacco Cessation  No Change    Warm-up and Cool-down  Performed as group-led instruction    Resistance Training Performed  No    VAD Patient?  No      Pain Assessment   Currently in Pain?  No/denies    Multiple Pain Sites  No       Capillary Blood Glucose: No results found for this or any previous visit (from the past 24 hour(s)).    Social History   Tobacco Use  Smoking Status Never Smoker  Smokeless Tobacco Never Used    Goals Met:  Independence with exercise equipment Improved SOB with ADL's Using PLB without cueing & demonstrates good technique Achieving weight loss Changing diet to healthy choices, watching portion sizes Exercise tolerated well No report of cardiac concerns or symptoms Strength training completed today  Goals Unmet:  Not Applicable  Comments: Service time is from 0645 to Hundred   Dr. Rush Farmer is Medical Director for Pulmonary Rehab at [facility].

## 2017-12-24 ENCOUNTER — Encounter (HOSPITAL_COMMUNITY): Payer: Medicare Other

## 2017-12-24 DIAGNOSIS — M318 Other specified necrotizing vasculopathies: Secondary | ICD-10-CM | POA: Diagnosis not present

## 2017-12-24 DIAGNOSIS — J8489 Other specified interstitial pulmonary diseases: Secondary | ICD-10-CM | POA: Diagnosis not present

## 2017-12-24 DIAGNOSIS — I776 Arteritis, unspecified: Secondary | ICD-10-CM | POA: Diagnosis not present

## 2017-12-24 NOTE — Progress Notes (Signed)
Pulmonary Individual Treatment Plan  Patient Details  Name: Alex Atkinson MRN: [id number] Date of Birth: 11-12-1950 Referring Provider:     Pulmonary Rehab Walk Test from 09/29/2017 in [facility]  Referring Provider  Dr. Nelda Marseille      Initial Encounter Date:    Pulmonary Rehab Walk Test from 09/29/2017 in [facility]  Date  10/01/17  Referring Provider  Dr. Nelda Marseille      Visit Diagnosis: Pulmonary fibrosis ([place])  Patient's Home Medications on Admission:   Current Outpatient Medications:  .  calcium citrate-vitamin D (CITRACAL+D) 315-200 MG-UNIT tablet, Take 1 tablet by mouth 2 (two) times daily., Disp: , Rfl:  .  gabapentin (NEURONTIN) 300 MG capsule, Take 1 capsule (300 mg total) by mouth 2 (two) times daily., Disp: 270 capsule, Rfl: 3 .  mycophenolate (CELLCEPT) 500 MG tablet, Take 1,500 mg by mouth. , Disp: , Rfl:  .  pantoprazole (PROTONIX) 40 MG tablet, Take 30- 60 min before your first and last meals of the day (Patient taking differently: Take 40 mg by mouth daily before breakfast. Take 30- 60 min before your first and last meals of the day), Disp: 60 tablet, Rfl: 2 .  predniSONE (DELTASONE) 10 MG tablet, Take 4 tablets (40 mg total) by mouth daily with breakfast. (Patient taking differently: Take 20 mg by mouth daily with breakfast. 2.5 tablets.), Disp: 120 tablet, Rfl: 1 .  sulfamethoxazole-trimethoprim (BACTRIM DS,SEPTRA DS) 800-160 MG tablet, Take 1 tablet by mouth every Tuesday, Thursday, and Saturday at 6 PM. In the morning., Disp: , Rfl:  .  Turmeric 500 MG TABS, Take 1,000 mg by mouth at bedtime. , Disp: , Rfl:   Past Medical History: Past Medical History:  Diagnosis Date  . Diverticulosis of colon 2005   Dr Fuller Plan  . Hemorrhoid 09/06/14   developed ? hemorrhoid  . Mononucleosis 1973  . PVC's (premature ventricular contractions)    caffeine induced; evaluated by Dr Aldona Bar    Tobacco Use: Social  History   Tobacco Use  Smoking Status Never Smoker  Smokeless Tobacco Never Used    Labs: Recent Review Flowsheet Data    Labs for ITP Cardiac and Pulmonary Rehab Latest Ref Rng & Units 05/09/2011 07/23/2012 07/26/2012   Cholestrol 0 - 200 mg/dL 204(H) 198 -   LDLCALC 0 - 99 mg/dL - 138(H) -   LDLDIRECT mg/dL 144.8 - -   HDL >39.00 mg/dL 41.00 35.30(L) -   Trlycerides 0.0 - 149.0 mg/dL 114.0 125.0 -   Hemoglobin A1c 4.6 - 6.5 % - - 5.5      Capillary Blood Glucose: No results found for: GLUCAP   Pulmonary Assessment Scores: Pulmonary Assessment Scores    Row Name 10/01/17 0738         ADL UCSD   ADL Phase  Entry       mMRC Score   mMRC Score  1        Pulmonary Function Assessment: Pulmonary Function Assessment - 09/28/17 1111      Breath   Bilateral Breath Sounds  Other    Other  course crackles mid to lower lobes bilat    Shortness of Breath  Limiting activity;Yes       Exercise Target Goals:    Exercise Program Goal: Individual exercise prescription set using results from initial 6 min walk test and THRR while considering  patient's activity barriers and safety.    Exercise Prescription Goal: Initial exercise  prescription builds to 30-45 minutes a day of aerobic activity, 2-3 days per week.  Home exercise guidelines will be given to patient during program as part of exercise prescription that the participant will acknowledge.  Activity Barriers & Risk Stratification:   6 Minute Walk: 6 Minute Walk    Row Name 10/01/17 0739 11/10/17 0849       6 Minute Walk   Phase  Initial  Mid Program    Distance  1700 feet  1400 feet    Walk Time  6 minutes  6 minutes    # of Rest Breaks  0  0    MPH  3.21  2.65    METS  3.45  2.99    RPE  13  13    Perceived Dyspnea   1.5  2    Symptoms  No  No    Resting HR  86 bpm  91 bpm    Resting BP  128/90  114/81    Resting Oxygen Saturation   94 %  97 %    Exercise Oxygen Saturation  during 6 min walk  86 %  87  %    Max Ex. HR  110 bpm  113 bpm    Max Ex. BP  180/88  112/73    2 Minute Post BP  140/92  -      Interval HR   1 Minute HR  98  97    2 Minute HR  108  102    3 Minute HR  111  102    4 Minute HR  108  102    5 Minute HR  108  102    6 Minute HR  110  113    2 Minute Post HR  92  101    Interval Heart Rate?  Yes  Yes      Interval Oxygen   Interval Oxygen?  Yes  Yes    Baseline Oxygen Saturation %  94 %  97 %    1 Minute Oxygen Saturation %  93 %  93 %    1 Minute Liters of Oxygen  0 L  0 L    2 Minute Oxygen Saturation %  88 %  90 %    2 Minute Liters of Oxygen  0 L  0 L    3 Minute Oxygen Saturation %  87 %  88 %    3 Minute Liters of Oxygen  0 L  0 L    4 Minute Oxygen Saturation %  87 %  87 %    4 Minute Liters of Oxygen  0 L  0 L    5 Minute Oxygen Saturation %  86 %  87 %    5 Minute Liters of Oxygen  0 L  2 L    6 Minute Oxygen Saturation %  87 %  89 %    6 Minute Liters of Oxygen  0 L  2 L    2 Minute Post Oxygen Saturation %  94 %  95 %    2 Minute Post Liters of Oxygen  0 L  2 L       Oxygen Initial Assessment: Oxygen Initial Assessment - 10/01/17 0737      Initial 6 min Walk   Oxygen Used  None      Program Oxygen Prescription   Program Oxygen Prescription  None Patient's lowest saturation was 86% during 6mt-will see how  he does on first day and re-evaluate oxygen needs       Oxygen Re-Evaluation: Oxygen Re-Evaluation    Row Name 10/26/17 0705 11/20/17 1013 12/21/17 1442         Program Oxygen Prescription   Program Oxygen Prescription  Continuous;E-Tanks  None  Continuous;E-Tanks     Liters per minute  2  - 4 liters on treadmill-3 liters on bike, elliptical, and rower  - 2-4 liters     Comments  only on treadmill  -  -       Home Oxygen   Home Oxygen Device  None no home oxygen at this time-will get it provided at home for exercise  E-Tanks Dr. Posey Pronto is going to place order for POC  E-Tanks     Sleep Oxygen Prescription  Continuous   Continuous  Continuous     Liters per minute  '2  2  2     ' Home Exercise Oxygen Prescription  Continuous  Continuous  Continuous     Liters per minute  2  - 3-4  - 2-4 liters     Home at Rest Exercise Oxygen Prescription  None  None  None     Compliance with Home Oxygen Use  Yes  Yes  Yes       Goals/Expected Outcomes   Short Term Goals  To learn and exhibit compliance with exercise, home and travel O2 prescription;To learn and understand importance of monitoring SPO2 with pulse oximeter and demonstrate accurate use of the pulse oximeter.;To learn and understand importance of maintaining oxygen saturations>88%;To learn and demonstrate proper pursed lip breathing techniques or other breathing techniques.;To learn and demonstrate proper use of respiratory medications  To learn and exhibit compliance with exercise, home and travel O2 prescription;To learn and understand importance of monitoring SPO2 with pulse oximeter and demonstrate accurate use of the pulse oximeter.;To learn and understand importance of maintaining oxygen saturations>88%;To learn and demonstrate proper pursed lip breathing techniques or other breathing techniques.;To learn and demonstrate proper use of respiratory medications  To learn and exhibit compliance with exercise, home and travel O2 prescription;To learn and understand importance of monitoring SPO2 with pulse oximeter and demonstrate accurate use of the pulse oximeter.;To learn and understand importance of maintaining oxygen saturations>88%;To learn and demonstrate proper pursed lip breathing techniques or other breathing techniques.;To learn and demonstrate proper use of respiratory medications     Long  Term Goals  Exhibits compliance with exercise, home and travel O2 prescription;Verbalizes importance of monitoring SPO2 with pulse oximeter and return demonstration;Maintenance of O2 saturations>88%;Exhibits proper breathing techniques, such as pursed lip breathing or other  method taught during program session;Compliance with respiratory medication;Demonstrates proper use of MDI's  Exhibits compliance with exercise, home and travel O2 prescription;Verbalizes importance of monitoring SPO2 with pulse oximeter and return demonstration;Maintenance of O2 saturations>88%;Exhibits proper breathing techniques, such as pursed lip breathing or other method taught during program session;Compliance with respiratory medication;Demonstrates proper use of MDI's  Exhibits compliance with exercise, home and travel O2 prescription;Verbalizes importance of monitoring SPO2 with pulse oximeter and return demonstration;Maintenance of O2 saturations>88%;Exhibits proper breathing techniques, such as pursed lip breathing or other method taught during program session;Compliance with respiratory medication;Demonstrates proper use of MDI's     Goals/Expected Outcomes  Patient will cont. to exhibit compliance with night oxygen and when patient receives home oxygen for exercise he will use appropriately.   Patient will cont. to exhibit compliance with night oxygen and when patient receives home oxygen for exercise  he will use appropriately.   Patient is compliant with home oxygen prescription. Pulmonologist is going to place order for patient to be tested for POC.         Oxygen Discharge (Final Oxygen Re-Evaluation): Oxygen Re-Evaluation - 12/21/17 1442      Program Oxygen Prescription   Program Oxygen Prescription  Continuous;E-Tanks    Liters per minute  -- 2-4 liters      Home Oxygen   Home Oxygen Device  E-Tanks    Sleep Oxygen Prescription  Continuous    Liters per minute  2    Home Exercise Oxygen Prescription  Continuous    Liters per minute  -- 2-4 liters    Home at Rest Exercise Oxygen Prescription  None    Compliance with Home Oxygen Use  Yes      Goals/Expected Outcomes   Short Term Goals  To learn and exhibit compliance with exercise, home and travel O2 prescription;To learn and  understand importance of monitoring SPO2 with pulse oximeter and demonstrate accurate use of the pulse oximeter.;To learn and understand importance of maintaining oxygen saturations>88%;To learn and demonstrate proper pursed lip breathing techniques or other breathing techniques.;To learn and demonstrate proper use of respiratory medications    Long  Term Goals  Exhibits compliance with exercise, home and travel O2 prescription;Verbalizes importance of monitoring SPO2 with pulse oximeter and return demonstration;Maintenance of O2 saturations>88%;Exhibits proper breathing techniques, such as pursed lip breathing or other method taught during program session;Compliance with respiratory medication;Demonstrates proper use of MDI's    Goals/Expected Outcomes  Patient is compliant with home oxygen prescription. Pulmonologist is going to place order for patient to be tested for POC.        Initial Exercise Prescription: Initial Exercise Prescription - 10/01/17 0700      Date of Initial Exercise RX and Referring Provider   Date  10/01/17    Referring Provider  Dr. Nelda Marseille      Treadmill   MPH  1.7    Grade  0    Minutes  17      Bike   Level  0.8    Minutes  17      Rower   Level  2    Watts  30    Minutes  17      Prescription Details   Frequency (times per week)  2    Duration  Progress to 45 minutes of aerobic exercise without signs/symptoms of physical distress      Intensity   THRR 40-80% of Max Heartrate  62-123    Ratings of Perceived Exertion  11-13    Perceived Dyspnea  0-4      Progression   Progression  Continue to progress workloads to maintain intensity without signs/symptoms of physical distress.      Resistance Training   Training Prescription  Yes    Weight  8    Reps  10-15       Perform Capillary Blood Glucose checks as needed.  Exercise Prescription Changes: Exercise Prescription Changes    Row Name 10/06/17 0809 10/08/17 0800 10/13/17 0700 10/15/17 0830  11/03/17 0852     Response to Exercise   Blood Pressure (Admit)  146/90  142/90  -  140/78  124/78   Blood Pressure (Exercise)  172/100  184/96  -  156/80  130/80   Blood Pressure (Exit)  114/84  118/84  -  104/70  100/70   Heart Rate (Admit)  65 bpm  90 bpm  -  68 bpm  65 bpm   Heart Rate (Exercise)  120 bpm  120 bpm  -  114 bpm  101 bpm   Heart Rate (Exit)  98 bpm  107 bpm  -  102 bpm  100 bpm   Oxygen Saturation (Admit)  95 %  91 %  -  97 %  96 %   Oxygen Saturation (Exercise)  88 %  87 %  -  91 %  91 %   Oxygen Saturation (Exit)  94 %  93 %  -  95 %  94 %   Rating of Perceived Exertion (Exercise)  13  13  -  13  12   Perceived Dyspnea (Exercise)  1  1  -  2  1   Duration  Progress to 45 minutes of aerobic exercise without signs/symptoms of physical distress  Progress to 45 minutes of aerobic exercise without signs/symptoms of physical distress  -  Continue with 45 min of aerobic exercise without signs/symptoms of physical distress.  Continue with 45 min of aerobic exercise without signs/symptoms of physical distress.   Intensity  - 40-80%HRR  - 40-80%HRR  -  -  -     Progression   Progression  Continue to progress workloads to maintain intensity without signs/symptoms of physical distress.  Continue to progress workloads to maintain intensity without signs/symptoms of physical distress.  -  Continue to progress workloads to maintain intensity without signs/symptoms of physical distress.  Continue to progress workloads to maintain intensity without signs/symptoms of physical distress.     Resistance Training   Training Prescription  -  -  -  -  -   Weight  -  -  -  -  -   Reps  -  -  -  -  -   Time  -  -  -  -  -     Interval Training   Interval Training  -  -  -  No  No     Treadmill   MPH  2.8  2.8 decreased to 2.5 due to BP  -  2.8  2.8   Grade  3  3  -  3  3   Minutes  17  17  -  17  17     Bike   Level  1.3  1.3  -  1.3  1.4   Minutes  17  17  -  17  17     Rower    Level  3  3  -  4  4   Watts  30  41  -  20  55   Minutes  17  17  -  17  17     Home Exercise Plan   Plans to continue exercise at  -  -  [facility] [employer] (comment)  -  -   Frequency  -  -  Add 3 additional days to program exercise sessions.  -  -   Row Name 11/05/17 0900 11/17/17 0800 11/26/17 0819 12/15/17 0800       Response to Exercise   Blood Pressure (Admit)  112/80  118/80  114/72  110/66    Blood Pressure (Exercise)  148/84  168/54  138/80  148/84    Blood Pressure (Exit)  100/68  100/68  108/80  100/80    Heart Rate (Admit)  70 bpm  85 bpm  88 bpm  86 bpm    Heart Rate (Exercise)  101 bpm  123 bpm  113 bpm  109 bpm    Heart Rate (Exit)  105 bpm  112 bpm  91 bpm  85 bpm    Oxygen Saturation (Admit)  95 %  93 %  96 %  91 %    Oxygen Saturation (Exercise)  92 %  91 %  90 %  90 %    Oxygen Saturation (Exit)  95 %  93 %  94 %  96 %    Rating of Perceived Exertion (Exercise)  '13  14  14  12    ' Perceived Dyspnea (Exercise)  '1  2  1  1    ' Duration  Continue with 45 min of aerobic exercise without signs/symptoms of physical distress.  Continue with 45 min of aerobic exercise without signs/symptoms of physical distress.  Continue with 45 min of aerobic exercise without signs/symptoms of physical distress.  Continue with 45 min of aerobic exercise without signs/symptoms of physical distress.      Progression   Progression  Continue to progress workloads to maintain intensity without signs/symptoms of physical distress.  Continue to progress workloads to maintain intensity without signs/symptoms of physical distress.  Continue to progress workloads to maintain intensity without signs/symptoms of physical distress.  Continue to progress workloads to maintain intensity without signs/symptoms of physical distress.      Interval Training   Interval Training  No  No  Yes  Yes      Oxygen   Oxygen  -  Continuous 2-3 liters  Continuous  Continuous    Liters  -  - 2-3 liters  3  3       Treadmill   MPH  2.'8  3  3  3    ' Grade  '3  4  8  10    ' Minutes  '17  17  17  17      ' Bike   Level  1.4  -  2  2    Minutes  17  -  17  17      NuStep   Level  -  -  -  3    SPM  -  -  -  80    Minutes  -  -  -  17    METs  -  -  -  1.7      Elliptical   Level  -  2  2  -    Speed  -  1  1  -    Minutes  -  17  17  -      Rower   Level  4  5  -  -    Watts  49  62  -  -    Minutes  17  17  -  -       Exercise Comments: Exercise Comments    Row Name 10/13/17 0722           Exercise Comments  Home exercise completed          Exercise Goals and Review: Exercise Goals    [place] Name 09/28/17 1030             Exercise Goals   Increase Physical Activity  Yes       Intervention  Provide advice, education, support and counseling about physical activity/exercise needs.;Develop an individualized exercise prescription for aerobic and resistive training  based on initial evaluation findings, risk stratification, comorbidities and participant's personal goals.       Expected Outcomes  Short Term: Attend rehab on a regular basis to increase amount of physical activity.       Increase Strength and Stamina  Yes       Intervention  Provide advice, education, support and counseling about physical activity/exercise needs.;Develop an individualized exercise prescription for aerobic and resistive training based on initial evaluation findings, risk stratification, comorbidities and participant's personal goals.       Expected Outcomes  Short Term: Increase workloads from initial exercise prescription for resistance, speed, and METs.       Able to understand and use rate of perceived exertion (RPE) scale  Yes       Intervention  Provide education and explanation on how to use RPE scale       Expected Outcomes  Short Term: Able to use RPE daily in rehab to express subjective intensity level;Long Term:  Able to use RPE to guide intensity level when exercising independently       Able to  understand and use Dyspnea scale  Yes       Intervention  Provide education and explanation on how to use Dyspnea scale       Expected Outcomes  Short Term: Able to use Dyspnea scale daily in rehab to express subjective sense of shortness of breath during exertion;Long Term: Able to use Dyspnea scale to guide intensity level when exercising independently       Knowledge and understanding of Target Heart Rate Range (THRR)  Yes       Intervention  Provide education and explanation of THRR including how the numbers were predicted and where they are located for reference       Expected Outcomes  Short Term: Able to state/look up THRR;Long Term: Able to use THRR to govern intensity when exercising independently;Short Term: Able to use daily as guideline for intensity in rehab       Understanding of Exercise Prescription  Yes       Intervention  Provide education, explanation, and written materials on patient's individual exercise prescription       Expected Outcomes  Short Term: Able to explain program exercise prescription;Long Term: Able to explain home exercise prescription to exercise independently          Exercise Goals Re-Evaluation : Exercise Goals Re-Evaluation    Row Name 10/26/17 0713 10/26/17 0724 11/20/17 1015 12/21/17 1558       Exercise Goal Re-Evaluation   Exercise Goals Review  Increase Physical Activity;Able to understand and use rate of perceived exertion (RPE) scale;Knowledge and understanding of Target Heart Rate Range (THRR);Understanding of Exercise Prescription;Increase Strength and Stamina;Able to understand and use Dyspnea scale  -  Increase Physical Activity;Able to understand and use rate of perceived exertion (RPE) scale;Knowledge and understanding of Target Heart Rate Range (THRR);Understanding of Exercise Prescription;Increase Strength and Stamina;Able to understand and use Dyspnea scale  Increase Physical Activity;Able to understand and use rate of perceived exertion  (RPE) scale;Knowledge and understanding of Target Heart Rate Range (THRR);Understanding of Exercise Prescription;Increase Strength and Stamina;Able to understand and use Dyspnea scale    Comments  Patient has attended 4 rehab sessions. Blood pressure was an issue in the beginning which has now been rectified by adding a new medication. Patient is able to work at an RPE of medium to somewhat hard. On the treadmill in particular, the patient needs 2 liters of oxygen. Patient  has been out for the last week with the flu. Will monitor and progress as able when the patient returns to rehab.  -  Patient is progressing well in program. Dr. Posey Pronto has approved patient to work on High Intensity Interval Protocol. Has advanced to elliptical-progressing his time. Has lost 20 pounds since 10/06/17.  Patient is progressing well in program. Working on High Intensity Interval protocol. Has advanced to elliptical-progressing his time. Has lost 23 pounds since 10/06/17. Will cont. to motivate and encourage home exercise.     Expected Outcomes  -  Through exercise at rehab and at home, patient will increase strength and stamina. The patient will also be able to meet his weight loss goal with more exercise days during the week.   Through exercise at rehab and at home, patient will increase strength and stamina. The patient will also be able to meet his weight loss goal with more exercise days during the week.   Through exercise at rehab and at home, patient will increase strength and stamina. The patient will also be able to meet his weight loss goal with more exercise days during the week.        Discharge Exercise Prescription (Final Exercise Prescription Changes): Exercise Prescription Changes - 12/15/17 0800      Response to Exercise   Blood Pressure (Admit)  110/66    Blood Pressure (Exercise)  148/84    Blood Pressure (Exit)  100/80    Heart Rate (Admit)  86 bpm    Heart Rate (Exercise)  109 bpm    Heart Rate (Exit)   85 bpm    Oxygen Saturation (Admit)  91 %    Oxygen Saturation (Exercise)  90 %    Oxygen Saturation (Exit)  96 %    Rating of Perceived Exertion (Exercise)  12    Perceived Dyspnea (Exercise)  1    Duration  Continue with 45 min of aerobic exercise without signs/symptoms of physical distress.      Progression   Progression  Continue to progress workloads to maintain intensity without signs/symptoms of physical distress.      Interval Training   Interval Training  Yes      Oxygen   Oxygen  Continuous    Liters  3      Treadmill   MPH  3    Grade  10    Minutes  17      Bike   Level  2    Minutes  17      NuStep   Level  3    SPM  80    Minutes  17    METs  1.7      Elliptical   Level  --    Speed  --    Minutes  --       Nutrition:  Target Goals: Understanding of nutrition guidelines, daily intake of sodium <1559m, cholesterol <2066m calories 30% from fat and 7% or less from saturated fats, daily to have 5 or more servings of fruits and vegetables.  Biometrics: Pre Biometrics - 09/28/17 1135      Pre Biometrics   Grip Strength  50 kg        Nutrition Therapy Plan and Nutrition Goals: Nutrition Therapy & Goals - 10/09/17 1406      Nutrition Therapy   Diet  General, healthful      Personal Nutrition Goals   Nutrition Goal  Identify food quantities necessary to achieve wt loss  of  -2# per week to a goal wt loss of 2.7-10.9 kg (6-24 lb) at graduation from pulmonary rehab.      Intervention Plan   Intervention  Prescribe, educate and counsel regarding individualized specific dietary modifications aiming towards targeted core components such as weight, hypertension, lipid management, diabetes, heart failure and other comorbidities.    Expected Outcomes  Short Term Goal: Understand basic principles of dietary content, such as calories, fat, sodium, cholesterol and nutrients.;Long Term Goal: Adherence to prescribed nutrition plan.       Nutrition  Assessments: Nutrition Assessments - 10/09/17 1403      Rate Your Plate Scores   Pre Score  60       Nutrition Goals Re-Evaluation:   Nutrition Goals Discharge (Final Nutrition Goals Re-Evaluation):   Psychosocial: Target Goals: Acknowledge presence or absence of significant depression and/or stress, maximize coping skills, provide positive support system. Participant is able to verbalize types and ability to use techniques and skills needed for reducing stress and depression.  Initial Review & Psychosocial Screening: Initial Psych Review & Screening - 09/28/17 1117      Initial Review   Current issues with  None Identified      Family Dynamics   Good Support System?  Yes      Barriers   Psychosocial barriers to participate in program  There are no identifiable barriers or psychosocial needs.;The patient should benefit from training in stress management and relaxation.      Screening Interventions   Interventions  Encouraged to exercise       Quality of Life Scores:  Scores of 19 and below usually indicate a poorer quality of life in these areas.  A difference of  2-3 points is a clinically meaningful difference.  A difference of 2-3 points in the total score of the Quality of Life Index has been associated with significant improvement in overall quality of life, self-image, physical symptoms, and general health in studies assessing change in quality of life.   PHQ-9: Recent Review Flowsheet Data    Depression screen [facility] 2/9 09/28/2017 05/21/2017   Decreased Interest 0 0   Down, Depressed, Hopeless 0 0   PHQ - 2 Score 0 0     Interpretation of Total Score  Total Score Depression Severity:  1-4 = Minimal depression, 5-9 = Mild depression, 10-14 = Moderate depression, 15-19 = Moderately severe depression, 20-27 = Severe depression   Psychosocial Evaluation and Intervention:   Psychosocial Re-Evaluation: Psychosocial Re-Evaluation    Row Name 09/28/17 1120 10/26/17  0852 11/23/17 0847 12/21/17 0747       Psychosocial Re-Evaluation   Current issues with  None Identified  Current Stress Concerns;Current Sleep Concerns  -  None Identified    Comments  -  patient was not sleeping well with his oxygen but that has been resolved with humidification. he continues to have multiple stress issues at work that he shares with the pulmonary rehab staff but feels he is able to deal with the stressors in a healthy way currently.  patient states stress and sleep issues have resolved  -    Expected Outcomes  patient will remain free from psychosocial barriers to participation in pulmonary rehab  patient will remain free from psychosocial barriers to participation in pulmonary rehab  patient will remain free from psychosocial barriers to participation in pulmonary rehab  patient will remain free from psychosocial barriers to participation in pulmonary rehab    Interventions  -  Encouraged to attend  Pulmonary Rehabilitation for the exercise  Encouraged to attend Pulmonary Rehabilitation for the exercise  -    Continue Psychosocial Services   Follow up required by staff  Follow up required by staff  Follow up required by staff  No Follow up required      Initial Review   Source of Stress Concerns  -  Occupation  -  -       Psychosocial Discharge (Final Psychosocial Re-Evaluation): Psychosocial Re-Evaluation - 12/21/17 0747      Psychosocial Re-Evaluation   Current issues with  None Identified    Expected Outcomes  patient will remain free from psychosocial barriers to participation in pulmonary rehab    Continue Psychosocial Services   No Follow up required       Education: Education Goals: Education classes will be provided on a weekly basis, covering required topics. Participant will state understanding/return demonstration of topics presented.  Learning Barriers/Preferences: Learning Barriers/Preferences - 09/28/17 1110      Learning Barriers/Preferences    Learning Barriers  None    Learning Preferences  Written Material;Computer/Internet;Group Instruction       Education Topics: Risk Factor Reduction:  -Group instruction that is supported by a PowerPoint presentation. Instructor discusses the definition of a risk factor, different risk factors for pulmonary disease, and how the heart and lungs work together.     Nutrition for Pulmonary Patient:  -Group instruction provided by PowerPoint slides, verbal discussion, and written materials to support subject matter. The instructor gives an explanation and review of healthy diet recommendations, which includes a discussion on weight management, recommendations for fruit and vegetable consumption, as well as protein, fluid, caffeine, fiber, sodium, sugar, and alcohol. Tips for eating when patients are short of breath are discussed.   Pursed Lip Breathing:  -Group instruction that is supported by demonstration and informational handouts. Instructor discusses the benefits of pursed lip and diaphragmatic breathing and detailed demonstration on how to preform both.     Oxygen Safety:  -Group instruction provided by PowerPoint, verbal discussion, and written material to support subject matter. There is an overview of "What is Oxygen" and "Why do we need it".  Instructor also reviews how to create a safe environment for oxygen use, the importance of using oxygen as prescribed, and the risks of noncompliance. There is a brief discussion on traveling with oxygen and resources the patient may utilize.   Oxygen Equipment:  -Group instruction provided by [facility] Staff utilizing handouts, written materials, and equipment demonstrations.   Signs and Symptoms:  -Group instruction provided by written material and verbal discussion to support subject matter. Warning signs and symptoms of infection, stroke, and heart attack are reviewed and when to call the physician/911 reinforced. Tips for preventing the  spread of infection discussed.   Advanced Directives:  -Group instruction provided by verbal instruction and written material to support subject matter. Instructor reviews Advanced Directive laws and proper instruction for filling out document.   Pulmonary Video:  -Group video education that reviews the importance of medication and oxygen compliance, exercise, good nutrition, pulmonary hygiene, and pursed lip and diaphragmatic breathing for the pulmonary patient.   Exercise for the Pulmonary Patient:  -Group instruction that is supported by a PowerPoint presentation. Instructor discusses benefits of exercise, core components of exercise, frequency, duration, and intensity of an exercise routine, importance of utilizing pulse oximetry during exercise, safety while exercising, and options of places to exercise outside of rehab.     Pulmonary Medications:  -Verbally interactive  group education provided by instructor with focus on inhaled medications and proper administration.   Anatomy and Physiology of the Respiratory System and Intimacy:  -Group instruction provided by PowerPoint, verbal discussion, and written material to support subject matter. Instructor reviews respiratory cycle and anatomical components of the respiratory system and their functions. Instructor also reviews differences in obstructive and restrictive respiratory diseases with examples of each. Intimacy, Sex, and Sexuality differences are reviewed with a discussion on how relationships can change when diagnosed with pulmonary disease. Common sexual concerns are reviewed.   MD DAY -A group question and answer session with a medical doctor that allows participants to ask questions that relate to their pulmonary disease state.   OTHER EDUCATION -Group or individual verbal, written, or video instructions that support the educational goals of the pulmonary rehab program.   Holiday Eating Survival Tips:  -Group instruction  provided by PowerPoint slides, verbal discussion, and written materials to support subject matter. The instructor gives patients tips, tricks, and techniques to help them not only survive but enjoy the holidays despite the onslaught of food that accompanies the holidays.   Knowledge Questionnaire Score:   Core Components/Risk Factors/Patient Goals at Admission: Personal Goals and Risk Factors at Admission - 09/28/17 1117      Core Components/Risk Factors/Patient Goals on Admission    Weight Management  Yes;Obesity    Intervention  Obesity: Provide education and appropriate resources to help participant work on and attain dietary goals.;Weight Management/Obesity: Establish reasonable short term and long term weight goals.;Weight Management: Provide education and appropriate resources to help participant work on and attain dietary goals.;Weight Management: Develop a combined nutrition and exercise program designed to reach desired caloric intake, while maintaining appropriate intake of nutrient and fiber, sodium and fats, and appropriate energy expenditure required for the weight goal.    Expected Outcomes  Understanding of distribution of calorie intake throughout the day with the consumption of 4-5 meals/snacks;Understanding recommendations for meals to include 15-35% energy as protein, 25-35% energy from fat, 35-60% energy from carbohydrates, less than 229m of dietary cholesterol, 20-35 gm of total fiber daily;Weight Loss: Understanding of general recommendations for a balanced deficit meal plan, which promotes 1-2 lb weight loss per week and includes a negative energy balance of [phone] kcal/d    Improve shortness of breath with ADL's  Yes    Intervention  Provide education, individualized exercise plan and daily activity instruction to help decrease symptoms of SOB with activities of daily living.    Expected Outcomes  Short Term: Improve cardiorespiratory fitness to achieve a reduction of  symptoms when performing ADLs;Long Term: Be able to perform more ADLs without symptoms or delay the onset of symptoms       Core Components/Risk Factors/Patient Goals Review:  Goals and Risk Factor Review    Row Name 10/23/17 0825 11/23/17 0831 12/21/17 0748 12/21/17 0814 12/21/17 0843     Core Components/Risk Factors/Patient Goals Review   Personal Goals Review  Weight Management/Obesity;Improve shortness of breath with ADL's;Develop more efficient breathing techniques such as purse lipped breathing and diaphragmatic breathing and practicing self-pacing with activity.;Hypertension;Stress  Weight Management/Obesity;Improve shortness of breath with ADL's;Develop more efficient breathing techniques such as purse lipped breathing and diaphragmatic breathing and practicing self-pacing with activity.;Hypertension;Stress  Weight Management/Obesity;Improve shortness of breath with ADL's;Develop more efficient breathing techniques such as purse lipped breathing and diaphragmatic breathing and practicing self-pacing with activity.;Hypertension;Stress  (Pended)   -  Weight Management/Obesity;Improve shortness of breath with ADL's;Develop more efficient breathing techniques such  as purse lipped breathing and diaphragmatic breathing and practicing self-pacing with activity.;Hypertension;Stress   Review  Patient has attended 4 sessions since admission. He has been absent the last two session related to diagnosis of the flu. It is too soon in the program for him to see improvement in his shortness of breath wiith ADLs however there have been several barriers to progression identified. During his first 2 sessions it was identified that he was hypertensive at rest and with exercise. 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 He continues to be coached on [place] however he most recently was observed using the breathing technique without promptimg. He has met with the department RD to discuss weightloss in preparation with meeting the lung transplant team. Admission weight 121.9, weight 10/15/17 120.7  Patient is doing extremely well in pulmonary rehab. His weight is down 9kg since admission. he continues to tolerate workload increases and states his shortness of breath has really improved with utilizing oxygen and PLB. His BP has decreased significantly with medication and weight loss and he is learning to manage stress more effectively. he states he is really excited about the progress he has made which makes him more motivated. anticipate continued progress over  -      Patient continues to do well in pulmonary rehab. He continues to follow a low cal, heart healty diet prescribed by the department RD and has lost to date 10.6 kg.  He states his shortness of breath has improved significantly and recently played a round of golf without any difficulty. He utilized PLB technique however he is still having "nasal stuffiness" and has modified the technique by breathing in using his mouth. His hypertesion is well controlled with low dose medication and it is also felt by his [facility] MD that his weight loss is playing a significant roll in managing his BP. Last encounter reading pre-exercise was 102/70 and exertional pressure was 138/80. He states work stress is no longer an issue. Will continue to follow his progression over the next 30 days.   Expected Outcomes  see admission expected outcomes.  see admission expected outcomes.  -  -  see admission expected outcomes.      Core Components/Risk Factors/Patient Goals at Discharge (Final Review):  Goals and Risk Factor Review - 12/21/17 0843      Core Components/Risk Factors/Patient Goals Review   Personal Goals Review  Weight  Management/Obesity;Improve shortness of breath with ADL's;Develop more efficient breathing techniques such as purse lipped breathing and diaphragmatic breathing and practicing self-pacing with activity.;Hypertension;Stress    Review  Patient continues to do well in pulmonary rehab. He continues to follow a low cal, heart healty diet prescribed by the department RD and has lost to date 10.6 kg.  He states his shortness of breath has improved significantly and recently played a round of golf without any difficulty. He utilized PLB technique however he is still having "nasal stuffiness" and has modified the technique by breathing in using his mouth. His hypertesion is well controlled with low dose medication and it is also felt by his [facility] MD that his weight loss is playing a significant roll in managing his BP. Last encounter reading pre-exercise was 102/70 and exertional pressure was 138/80. He states work stress is no longer an  issue. Will continue to follow his progression over the next 30 days.    Expected Outcomes  see admission expected outcomes.       ITP Comments: ITP Comments    Row Name 11/03/17 0929 12/21/17 0855         ITP Comments  -  Dr. Jennet Maduro, Medical Director         Comments: patient has attended 18 pulmonary rehab sessions since admission

## 2017-12-29 ENCOUNTER — Encounter (HOSPITAL_COMMUNITY): Payer: Medicare Other

## 2017-12-29 DIAGNOSIS — J849 Interstitial pulmonary disease, unspecified: Secondary | ICD-10-CM | POA: Diagnosis not present

## 2017-12-29 DIAGNOSIS — R0600 Dyspnea, unspecified: Secondary | ICD-10-CM | POA: Diagnosis not present

## 2017-12-30 DIAGNOSIS — J8489 Other specified interstitial pulmonary diseases: Secondary | ICD-10-CM | POA: Diagnosis not present

## 2017-12-30 DIAGNOSIS — Z01818 Encounter for other preprocedural examination: Secondary | ICD-10-CM | POA: Diagnosis not present

## 2017-12-30 DIAGNOSIS — M318 Other specified necrotizing vasculopathies: Secondary | ICD-10-CM | POA: Diagnosis not present

## 2017-12-31 ENCOUNTER — Encounter (HOSPITAL_COMMUNITY)
Admission: RE | Admit: 2017-12-31 | Discharge: 2017-12-31 | Disposition: A | Discharge status: Home / Self Care | Payer: Medicare Other | Source: Ambulatory Visit | Attending: Pulmonary Disease | Admitting: Pulmonary Disease

## 2017-12-31 VITALS — Wt 242.7 lb

## 2017-12-31 DIAGNOSIS — J8410 Pulmonary fibrosis, unspecified: Secondary | ICD-10-CM

## 2017-12-31 NOTE — Progress Notes (Signed)
Daily Session Note  Patient Details  Name: Alex Atkinson MRN: [id number] Date of Birth: Dec 23, 1950 Referring Provider:     Pulmonary Rehab Walk Test from 09/29/2017 in [place]  Referring Provider  Dr. Nelda Marseille      Encounter Date: 12/31/2017  Check In: Session Check In - 12/31/17 0718      Check-In   Location  [facility]    Staff Present  Su Hilt, MS, ACSM RCEP, Exercise Physiologist;Annedrea Rosezella [place], RN, [place]    Supervising physician immediately available to respond to emergencies  [facility] immediately available    Physician(s)  Dr. Zigmund Daniel    Medication changes reported      Yes    Comments  prednisone decreased to 93m    Fall or balance concerns reported     No    Tobacco Cessation  No Change    Warm-up and Cool-down  Performed as group-led instruction    Resistance Training Performed  No    VAD Patient?  No      Pain Assessment   Currently in Pain?  No/denies    Multiple Pain Sites  No       Capillary Blood Glucose: No results found for this or any previous visit (from the past 24 hour(s)).    Social History   Tobacco Use  Smoking Status Never Smoker  Smokeless Tobacco Never Used    Goals Met:  Exercise tolerated well No report of cardiac concerns or symptoms Strength training completed today  Goals Unmet:  Not Applicable  Comments: Service time is from 6:45a to 8:00a    Dr. WRush Farmeris Medical Director for Pulmonary Rehab at [facility]

## 2018-01-01 NOTE — Progress Notes (Signed)
BRONDON WANN 67 y.o. male  DOB: 04/30/1951 MRN: [id number]           Nutrition Note 1. Pulmonary fibrosis ([place])   Note Spoke with pt and pt's wife,[name]. Pt is doing well with his wt loss. Pt's wife is preparing meals and snacks. Pt reports wt loss plateau that lasted about 1.5 weeks. Pt is not currently exercising on non-rehab days due to not having portable O2. Pt has been working to try and obtain portable O2 for ~ 9 weeks. Pt continues to be satisfied with his wt loss plan at this time. Pt expressed understanding of the information reviewed via feedback method.      Wt Readings from Last 3 Encounters:  12/31/17 242 lb 11.6 oz (110.1 kg)  12/15/17 247 lb 9.2 oz (112.3 kg)  12/11/17 252 lb 2 oz (114.4 kg)    Nutrition Diagnosis ? Food-and nutrition-related knowledge deficit related to lack of exposure to information as related to diagnosis of pulmonary disease ? Obesity related to excessive energy intake as evidenced by a BMI of 36.8  Nutrition Intervention ? Pt's individual nutrition plan and goals reviewed with pt. ? Will check with Pulmonary Rehab staff to see if we can assist pt with portable O2 ? Pt to attend the Nutrition and Lung Disease class  Goal(s) 1. Identify food quantities necessary to achieve wt loss of  -2# per week to a goal wt loss of 2.7-10.9 kg (6-24 lb) at graduation from pulmonary rehab. - meeting goal at this time  Plan:  Will f/u with pt re: wt loss per pt discretion at this time Pt to attend Pulmonary Nutrition class Will provide client-centered nutrition education as part of interdisciplinary care.   Monitor and evaluate progress toward nutrition goal with team.  Monitor and Evaluate progress toward nutrition goal with team.   Derek Mound, M.Ed, RD, LDN, CDE 01/01/2018 3:00 PM

## 2018-01-05 ENCOUNTER — Encounter (HOSPITAL_COMMUNITY)
Admission: RE | Admit: 2018-01-05 | Discharge: 2018-01-05 | Disposition: A | Discharge status: Home / Self Care | Payer: Medicare Other | Source: Ambulatory Visit | Attending: Pulmonary Disease | Admitting: Pulmonary Disease

## 2018-01-05 DIAGNOSIS — J8410 Pulmonary fibrosis, unspecified: Secondary | ICD-10-CM | POA: Diagnosis not present

## 2018-01-05 NOTE — Progress Notes (Signed)
Daily Session Note  Patient Details  Name: Alex Atkinson MRN: [id number] Date of Birth: 08-22-1950 Referring Provider:     Pulmonary Rehab Walk Test from 09/29/2017 in [place]  Referring Provider  Dr. Nelda Marseille      Encounter Date: 01/05/2018  Check In: Session Check In - 01/05/18 0703      Check-In   Location  [facility]    Staff Present  Su Hilt, MS, ACSM RCEP, Exercise Physiologist;Annedrea Rosezella [place], RN, [facility]    Supervising physician immediately available to respond to emergencies  [facility] immediately available    Physician(s)  Dr. Cathlean Sauer    Medication changes reported      No    Fall or balance concerns reported     No    Tobacco Cessation  No Change    Warm-up and Cool-down  Performed as group-led instruction    Resistance Training Performed  No    VAD Patient?  No      Pain Assessment   Currently in Pain?  No/denies    Multiple Pain Sites  No       Capillary Blood Glucose: No results found for this or any previous visit (from the past 24 hour(s)).    Social History   Tobacco Use  Smoking Status Never Smoker  Smokeless Tobacco Never Used    Goals Met:  Exercise tolerated well No report of cardiac concerns or symptoms Strength training completed today  Goals Unmet:  Not Applicable  Comments: Service time is from 6:45a to 8:00a    Dr. Rush Farmer is Medical Director for Pulmonary Rehab at [facility].

## 2018-01-07 ENCOUNTER — Encounter (HOSPITAL_COMMUNITY)
Admission: RE | Admit: 2018-01-07 | Discharge: 2018-01-07 | Disposition: A | Discharge status: Home / Self Care | Payer: Medicare Other | Source: Ambulatory Visit | Attending: Pulmonary Disease | Admitting: Pulmonary Disease

## 2018-01-07 DIAGNOSIS — J8410 Pulmonary fibrosis, unspecified: Secondary | ICD-10-CM

## 2018-01-07 NOTE — Progress Notes (Signed)
Daily Session Note  Patient Details  Name: Alex Atkinson MRN: [id number] Date of Birth: [birth year] Referring Provider:     Pulmonary Rehab Walk Test from 09/29/2017 in [place]  Referring Provider  Dr. Nelda Marseille      Encounter Date: 01/07/2018  Check In: Session Check In - 01/07/18 0705      Check-In   Location  [facility]    Staff Present  Su Hilt, MS, ACSM RCEP, Exercise Physiologist;Annedrea Rosezella [place], RN, [facility]    Supervising physician immediately available to respond to emergencies  [facility] immediately available    Physician(s)  Dr. Wyline Copas    Medication changes reported      No    Fall or balance concerns reported     No    Tobacco Cessation  No Change    Warm-up and Cool-down  Performed as group-led instruction    Resistance Training Performed  No    VAD Patient?  No      Pain Assessment   Currently in Pain?  No/denies    Multiple Pain Sites  No       Capillary Blood Glucose: No results found for this or any previous visit (from the past 24 hour(s)).    Social History   Tobacco Use  Smoking Status Never Smoker  Smokeless Tobacco Never Used    Goals Met:  Exercise tolerated well No report of cardiac concerns or symptoms Strength training completed today  Goals Unmet:  Not Applicable  Comments: Service time is from 6:45a to 8:10a    Dr. Rush Farmer is Medical Director for Pulmonary Rehab at [facility].

## 2018-01-12 ENCOUNTER — Encounter (HOSPITAL_COMMUNITY)
Admission: RE | Admit: 2018-01-12 | Discharge: 2018-01-12 | Disposition: A | Discharge status: Home / Self Care | Payer: Medicare Other | Source: Ambulatory Visit | Attending: Pulmonary Disease | Admitting: Pulmonary Disease

## 2018-01-12 DIAGNOSIS — J8410 Pulmonary fibrosis, unspecified: Secondary | ICD-10-CM | POA: Diagnosis not present

## 2018-01-14 ENCOUNTER — Encounter (HOSPITAL_COMMUNITY): Payer: Medicare Other

## 2018-01-19 ENCOUNTER — Encounter (HOSPITAL_COMMUNITY): Payer: Medicare Other

## 2018-01-21 ENCOUNTER — Encounter (HOSPITAL_COMMUNITY): Payer: Medicare Other

## 2018-01-29 DIAGNOSIS — H25812 Combined forms of age-related cataract, left eye: Secondary | ICD-10-CM | POA: Diagnosis not present

## 2018-01-29 DIAGNOSIS — H2511 Age-related nuclear cataract, right eye: Secondary | ICD-10-CM | POA: Diagnosis not present

## 2018-02-02 ENCOUNTER — Encounter (HOSPITAL_COMMUNITY)
Admission: RE | Admit: 2018-02-02 | Discharge: 2018-02-02 | Disposition: A | Discharge status: Home / Self Care | Payer: Self-pay | Source: Ambulatory Visit | Attending: Internal Medicine | Admitting: Internal Medicine

## 2018-02-02 DIAGNOSIS — J849 Interstitial pulmonary disease, unspecified: Secondary | ICD-10-CM | POA: Insufficient documentation

## 2018-02-02 DIAGNOSIS — R0602 Shortness of breath: Secondary | ICD-10-CM | POA: Insufficient documentation

## 2018-02-03 DIAGNOSIS — I776 Arteritis, unspecified: Secondary | ICD-10-CM | POA: Diagnosis not present

## 2018-02-03 DIAGNOSIS — Z6835 Body mass index (BMI) 35.0-35.9, adult: Secondary | ICD-10-CM | POA: Diagnosis not present

## 2018-02-03 DIAGNOSIS — J849 Interstitial pulmonary disease, unspecified: Secondary | ICD-10-CM | POA: Diagnosis not present

## 2018-02-03 DIAGNOSIS — Z79899 Other long term (current) drug therapy: Secondary | ICD-10-CM | POA: Diagnosis not present

## 2018-02-04 ENCOUNTER — Encounter (HOSPITAL_COMMUNITY)
Admission: RE | Admit: 2018-02-04 | Discharge: 2018-02-04 | Disposition: A | Discharge status: Home / Self Care | Payer: Self-pay | Source: Ambulatory Visit | Attending: Internal Medicine | Admitting: Internal Medicine

## 2018-02-04 DIAGNOSIS — H25811 Combined forms of age-related cataract, right eye: Secondary | ICD-10-CM | POA: Diagnosis not present

## 2018-02-04 DIAGNOSIS — H5703 Miosis: Secondary | ICD-10-CM | POA: Diagnosis not present

## 2018-02-04 DIAGNOSIS — H2512 Age-related nuclear cataract, left eye: Secondary | ICD-10-CM | POA: Diagnosis not present

## 2018-02-09 ENCOUNTER — Encounter (HOSPITAL_COMMUNITY)
Admission: RE | Admit: 2018-02-09 | Discharge: 2018-02-09 | Disposition: A | Discharge status: Home / Self Care | Payer: Self-pay | Source: Ambulatory Visit | Attending: Internal Medicine | Admitting: Internal Medicine

## 2018-02-11 ENCOUNTER — Encounter (HOSPITAL_COMMUNITY)
Admission: RE | Admit: 2018-02-11 | Discharge: 2018-02-11 | Disposition: A | Discharge status: Home / Self Care | Payer: Self-pay | Source: Ambulatory Visit | Attending: Internal Medicine | Admitting: Internal Medicine

## 2018-02-16 ENCOUNTER — Encounter (HOSPITAL_COMMUNITY)
Admission: RE | Admit: 2018-02-16 | Discharge: 2018-02-16 | Disposition: A | Discharge status: Home / Self Care | Payer: Medicare Other | Source: Ambulatory Visit | Attending: Internal Medicine | Admitting: Internal Medicine

## 2018-02-16 DIAGNOSIS — J849 Interstitial pulmonary disease, unspecified: Secondary | ICD-10-CM | POA: Insufficient documentation

## 2018-02-16 DIAGNOSIS — R0602 Shortness of breath: Secondary | ICD-10-CM | POA: Insufficient documentation

## 2018-02-18 ENCOUNTER — Ambulatory Visit (HOSPITAL_COMMUNITY): Payer: Self-pay | Admitting: *Deleted

## 2018-02-18 DIAGNOSIS — J8410 Pulmonary fibrosis, unspecified: Secondary | ICD-10-CM

## 2018-02-18 NOTE — Progress Notes (Addendum)
Discharge Progress Report  Patient Details  Name: Alex Atkinson MRN: [id number] Date of Birth: 1951/03/11 Referring Provider:     Pulmonary Rehab Walk Test from 09/29/2017 in [place]  Referring Provider  Dr. Nelda Marseille       Number of Visits: 22  Reason for Discharge:  Patient reached a stable level of exercise. Patient independent in their exercise. Patient has met program and personal goals.  Smoking History:  Social History   Tobacco Use  Smoking Status Never Smoker  Smokeless Tobacco Never Used    Diagnosis:  Pulmonary fibrosis ([place])  ADL [facility]: Pulmonary Assessment Scores    Row Name 01/12/18 0805 01/25/18 1213       ADL [facility]   ADL Phase  Exit  Exit    SOB Score total  -  13      mMRC Score   mMRC Score  0  7       Initial Exercise Prescription:   Discharge Exercise Prescription (Final Exercise Prescription Changes):   Functional Capacity: 6 Minute Walk    Row Name 01/12/18 0756         6 Minute Walk   Phase  Discharge     Distance  1500 feet     Distance Feet Change  100 ft     Walk Time  6 minutes     # of Rest Breaks  0     MPH  2.84     METS  3.14     RPE  13     Perceived Dyspnea   1     Symptoms  No     Resting HR  86 bpm     Resting BP  100/80     Resting Oxygen Saturation   96 %     Exercise Oxygen Saturation  during 6 min walk  91 %     Max Ex. HR  106 bpm     Max Ex. BP  114/64       Interval HR   1 Minute HR  94     2 Minute HR  95     3 Minute HR  95     4 Minute HR  95     5 Minute HR  - PULSE OX DIDNT READ     6 Minute HR  106     2 Minute Post HR  99     Interval Heart Rate?  Yes       Interval Oxygen   Interval Oxygen?  Yes     Baseline Oxygen Saturation %  96 %     1 Minute Oxygen Saturation %  95 %     1 Minute Liters of Oxygen  4 L     2 Minute Oxygen Saturation %  92 %     2 Minute Liters of Oxygen  4 L     3 Minute Oxygen Saturation %  92 %     3 Minute Liters of  Oxygen  4 L     4 Minute Oxygen Saturation %  91 %     4 Minute Liters of Oxygen  4 L     5 Minute Oxygen Saturation %  91 %     5 Minute Liters of Oxygen  4 L     6 Minute Oxygen Saturation %  94 %     6 Minute Liters of Oxygen  4 L  2 Minute Post Oxygen Saturation %  95 %     2 Minute Post Liters of Oxygen  4 L        Psychological, QOL, Others - Outcomes: PHQ 2/9: Depression screen [facility] 2/9 01/12/2018 09/28/2017 05/21/2017  Decreased Interest 0 0 0  Down, Depressed, Hopeless 0 0 0  PHQ - 2 Score 0 0 0    Quality of Life:   Personal Goals: Goals established at orientation with interventions provided to work toward goal.    Personal Goals Discharge: Goals and Risk Factor Review    Row Name 12/21/17 0748 12/21/17 0814 12/21/17 0843 02/18/18 1538       Core Components/Risk Factors/Patient Goals Review   Personal Goals Review  Weight Management/Obesity;Improve shortness of breath with ADL's;Develop more efficient breathing techniques such as purse lipped breathing and diaphragmatic breathing and practicing self-pacing with activity.;Hypertension;Stress  (Pended)   -  Weight Management/Obesity;Improve shortness of breath with ADL's;Develop more efficient breathing techniques such as purse lipped breathing and diaphragmatic breathing and practicing self-pacing with activity.;Hypertension;Stress  Weight Management/Obesity;Improve shortness of breath with ADL's;Develop more efficient breathing techniques such as purse lipped breathing and diaphragmatic breathing and practicing self-pacing with activity.;Hypertension;Stress    Review  -      Patient continues to do well in pulmonary rehab. He continues to follow a low cal, heart healty diet prescribed by the department RD and has lost to date 10.6 kg.  He states his shortness of breath has improved significantly and recently played a round of golf without any difficulty. He utilized PLB technique however he is still having "nasal  stuffiness" and has modified the technique by breathing in using his mouth. His hypertesion is well controlled with low dose medication and it is also felt by his [facility] MD that his weight loss is playing a significant roll in managing his BP. Last encounter reading pre-exercise was 102/70 and exertional pressure was 138/80. He states work stress is no longer an issue. Will continue to follow his progression over the next 30 days.  Pt graduates with the completion of 22 exercise sessions. Pt participated in the 6:45 am exercise class. pt lost during his participation 11 kg.  bp remained WNL and exertional bp remained within normal parameters. Pt able to perform ADLS independently.    Expected Outcomes  -  -  see admission expected outcomes.  -       Exercise Goals and Review:   Nutrition & Weight - Outcomes:  Post Biometrics - 01/12/18 0714       Post  Biometrics   Grip Strength  50 kg       Nutrition:   Nutrition Discharge: Nutrition Assessments - 01/29/18 0857      Rate Your Plate Scores   Pre Score  60    Post Score  64       Education Questionnaire Score: Knowledge Questionnaire Score - 01/25/18 1213      Knowledge Questionnaire Score   Pre Score  16/18    Post Score  17/18       Goals reviewed with patient. Cherre Huger, BSN Cardiac and Training and development officer

## 2018-02-22 DIAGNOSIS — R768 Other specified abnormal immunological findings in serum: Secondary | ICD-10-CM | POA: Diagnosis not present

## 2018-02-22 DIAGNOSIS — H269 Unspecified cataract: Secondary | ICD-10-CM | POA: Diagnosis not present

## 2018-02-22 DIAGNOSIS — M7989 Other specified soft tissue disorders: Secondary | ICD-10-CM | POA: Diagnosis not present

## 2018-02-22 DIAGNOSIS — G629 Polyneuropathy, unspecified: Secondary | ICD-10-CM | POA: Diagnosis not present

## 2018-02-22 DIAGNOSIS — M79642 Pain in left hand: Secondary | ICD-10-CM | POA: Diagnosis not present

## 2018-02-22 DIAGNOSIS — Z79899 Other long term (current) drug therapy: Secondary | ICD-10-CM | POA: Diagnosis not present

## 2018-02-22 DIAGNOSIS — M79643 Pain in unspecified hand: Secondary | ICD-10-CM | POA: Diagnosis not present

## 2018-02-22 DIAGNOSIS — J849 Interstitial pulmonary disease, unspecified: Secondary | ICD-10-CM | POA: Diagnosis not present

## 2018-02-22 DIAGNOSIS — I776 Arteritis, unspecified: Secondary | ICD-10-CM | POA: Diagnosis not present

## 2018-02-22 DIAGNOSIS — M79641 Pain in right hand: Secondary | ICD-10-CM | POA: Diagnosis not present

## 2018-02-22 DIAGNOSIS — Z6834 Body mass index (BMI) 34.0-34.9, adult: Secondary | ICD-10-CM | POA: Diagnosis not present

## 2018-02-23 ENCOUNTER — Encounter (HOSPITAL_COMMUNITY)
Admission: RE | Admit: 2018-02-23 | Discharge: 2018-02-23 | Disposition: A | Discharge status: Home / Self Care | Payer: Medicare Other | Source: Ambulatory Visit | Attending: Internal Medicine | Admitting: Internal Medicine

## 2018-02-25 ENCOUNTER — Encounter (HOSPITAL_COMMUNITY): Payer: Self-pay

## 2018-03-02 ENCOUNTER — Encounter (HOSPITAL_COMMUNITY): Payer: Self-pay

## 2018-03-04 ENCOUNTER — Encounter (HOSPITAL_COMMUNITY): Payer: Self-pay

## 2018-03-08 DIAGNOSIS — H25812 Combined forms of age-related cataract, left eye: Secondary | ICD-10-CM | POA: Diagnosis not present

## 2018-03-08 DIAGNOSIS — H2512 Age-related nuclear cataract, left eye: Secondary | ICD-10-CM | POA: Diagnosis not present

## 2018-03-09 ENCOUNTER — Encounter (HOSPITAL_COMMUNITY): Payer: Self-pay

## 2018-03-11 ENCOUNTER — Encounter (HOSPITAL_COMMUNITY)
Admission: RE | Admit: 2018-03-11 | Discharge: 2018-03-11 | Disposition: A | Discharge status: Home / Self Care | Payer: Medicare Other | Source: Ambulatory Visit | Attending: Internal Medicine | Admitting: Internal Medicine

## 2018-03-11 DIAGNOSIS — G629 Polyneuropathy, unspecified: Secondary | ICD-10-CM | POA: Diagnosis not present

## 2018-03-11 DIAGNOSIS — R030 Elevated blood-pressure reading, without diagnosis of hypertension: Secondary | ICD-10-CM | POA: Diagnosis not present

## 2018-03-11 DIAGNOSIS — J8410 Pulmonary fibrosis, unspecified: Secondary | ICD-10-CM | POA: Diagnosis not present

## 2018-03-11 DIAGNOSIS — Z0000 Encounter for general adult medical examination without abnormal findings: Secondary | ICD-10-CM | POA: Diagnosis not present

## 2018-03-11 DIAGNOSIS — E785 Hyperlipidemia, unspecified: Secondary | ICD-10-CM | POA: Diagnosis not present

## 2018-03-11 DIAGNOSIS — M317 Microscopic polyangiitis: Secondary | ICD-10-CM | POA: Diagnosis not present

## 2018-03-11 DIAGNOSIS — Z125 Encounter for screening for malignant neoplasm of prostate: Secondary | ICD-10-CM | POA: Diagnosis not present

## 2018-03-11 DIAGNOSIS — D692 Other nonthrombocytopenic purpura: Secondary | ICD-10-CM | POA: Diagnosis not present

## 2018-03-11 DIAGNOSIS — G4734 Idiopathic sleep related nonobstructive alveolar hypoventilation: Secondary | ICD-10-CM | POA: Diagnosis not present

## 2018-03-16 ENCOUNTER — Encounter (HOSPITAL_COMMUNITY): Payer: Self-pay

## 2018-03-18 ENCOUNTER — Encounter (HOSPITAL_COMMUNITY): Payer: Self-pay

## 2018-03-18 DIAGNOSIS — J849 Interstitial pulmonary disease, unspecified: Secondary | ICD-10-CM | POA: Insufficient documentation

## 2018-03-18 DIAGNOSIS — R0602 Shortness of breath: Secondary | ICD-10-CM | POA: Insufficient documentation

## 2018-03-23 ENCOUNTER — Encounter (HOSPITAL_COMMUNITY): Payer: Self-pay

## 2018-03-25 ENCOUNTER — Encounter (HOSPITAL_COMMUNITY): Payer: Self-pay

## 2018-03-30 ENCOUNTER — Encounter (HOSPITAL_COMMUNITY): Payer: Self-pay

## 2018-03-30 ENCOUNTER — Encounter (HOSPITAL_COMMUNITY)
Admission: RE | Admit: 2018-03-30 | Discharge: 2018-03-30 | Disposition: A | Discharge status: Home / Self Care | Payer: Self-pay | Source: Ambulatory Visit | Attending: Pulmonary Disease | Admitting: Pulmonary Disease

## 2018-04-01 ENCOUNTER — Encounter (HOSPITAL_COMMUNITY): Payer: Self-pay

## 2018-04-06 ENCOUNTER — Encounter (HOSPITAL_COMMUNITY)
Admission: RE | Admit: 2018-04-06 | Discharge: 2018-04-06 | Disposition: A | Discharge status: Home / Self Care | Payer: Self-pay | Source: Ambulatory Visit | Attending: Pulmonary Disease | Admitting: Pulmonary Disease

## 2018-04-06 ENCOUNTER — Encounter (HOSPITAL_COMMUNITY): Payer: Self-pay

## 2018-04-07 DIAGNOSIS — R768 Other specified abnormal immunological findings in serum: Secondary | ICD-10-CM | POA: Diagnosis not present

## 2018-04-07 DIAGNOSIS — J849 Interstitial pulmonary disease, unspecified: Secondary | ICD-10-CM | POA: Diagnosis not present

## 2018-04-08 ENCOUNTER — Encounter (HOSPITAL_COMMUNITY): Payer: Self-pay

## 2018-04-08 ENCOUNTER — Encounter (HOSPITAL_COMMUNITY)
Admission: RE | Admit: 2018-04-08 | Discharge: 2018-04-08 | Disposition: A | Discharge status: Home / Self Care | Payer: Self-pay | Source: Ambulatory Visit | Attending: Pulmonary Disease | Admitting: Pulmonary Disease

## 2018-04-08 DIAGNOSIS — Z7952 Long term (current) use of systemic steroids: Secondary | ICD-10-CM | POA: Diagnosis not present

## 2018-04-08 DIAGNOSIS — M8588 Other specified disorders of bone density and structure, other site: Secondary | ICD-10-CM | POA: Diagnosis not present

## 2018-04-13 ENCOUNTER — Encounter (HOSPITAL_COMMUNITY)
Admission: RE | Admit: 2018-04-13 | Discharge: 2018-04-13 | Disposition: A | Discharge status: Home / Self Care | Payer: Self-pay | Source: Ambulatory Visit | Attending: Pulmonary Disease | Admitting: Pulmonary Disease

## 2018-04-13 ENCOUNTER — Encounter (HOSPITAL_COMMUNITY): Payer: Self-pay

## 2018-04-15 ENCOUNTER — Encounter (HOSPITAL_COMMUNITY)
Admission: RE | Admit: 2018-04-15 | Discharge: 2018-04-15 | Disposition: A | Discharge status: Home / Self Care | Payer: Self-pay | Source: Ambulatory Visit | Attending: Pulmonary Disease | Admitting: Pulmonary Disease

## 2018-04-15 ENCOUNTER — Encounter (HOSPITAL_COMMUNITY): Payer: Self-pay

## 2018-04-20 ENCOUNTER — Encounter (HOSPITAL_COMMUNITY)
Admission: RE | Admit: 2018-04-20 | Discharge: 2018-04-20 | Disposition: A | Discharge status: Home / Self Care | Payer: Self-pay | Source: Ambulatory Visit | Attending: Pulmonary Disease | Admitting: Pulmonary Disease

## 2018-04-20 ENCOUNTER — Encounter (HOSPITAL_COMMUNITY): Payer: Self-pay

## 2018-04-20 DIAGNOSIS — J849 Interstitial pulmonary disease, unspecified: Secondary | ICD-10-CM | POA: Insufficient documentation

## 2018-04-20 DIAGNOSIS — R0602 Shortness of breath: Secondary | ICD-10-CM | POA: Insufficient documentation

## 2018-04-22 ENCOUNTER — Encounter (HOSPITAL_COMMUNITY): Payer: Self-pay

## 2018-04-22 DIAGNOSIS — R0982 Postnasal drip: Secondary | ICD-10-CM | POA: Diagnosis not present

## 2018-04-22 DIAGNOSIS — G4734 Idiopathic sleep related nonobstructive alveolar hypoventilation: Secondary | ICD-10-CM | POA: Diagnosis not present

## 2018-04-22 DIAGNOSIS — R05 Cough: Secondary | ICD-10-CM | POA: Diagnosis not present

## 2018-04-23 ENCOUNTER — Encounter: Payer: Self-pay | Admitting: Neurology

## 2018-04-23 ENCOUNTER — Ambulatory Visit (INDEPENDENT_AMBULATORY_CARE_PROVIDER_SITE_OTHER): Payer: Medicare Other | Admitting: Neurology

## 2018-04-23 VITALS — BP 120/80 | HR 88 | Ht 72.0 in | Wt 249.2 lb

## 2018-04-23 DIAGNOSIS — G619 Inflammatory polyneuropathy, unspecified: Secondary | ICD-10-CM | POA: Diagnosis not present

## 2018-04-23 DIAGNOSIS — M317 Microscopic polyangiitis: Secondary | ICD-10-CM

## 2018-04-23 NOTE — Progress Notes (Signed)
Follow-up Visit   Date: 04/23/18    Alex Atkinson MRN: [id number] DOB: Jul 01, 1951   Interim History: Alex Atkinson is a 67 y.o. right-handed Caucasian Animal nutritionist with history of interstitial lung disease and microscopic polyangiitis returning to the clinic for follow-up of vasculitis neuropathy.  The patient was accompanied to the clinic by self.  History of present illness: Patient was in his usual state of health until several years ago and then developed a chronic cough.  In July 2018, he was hiking in [place], [place] and was unable to keep up with his wife while hiking because of fatigue and shortness of breath.  By August, he was having headaches, generalizes muscle aches, and joint pain.  Initially, there was treated for community acquired pneumonia as well as [facility] spotted fever, but this diagnosis was later retracted by [name], Infectious Disease who he saw in October.  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 CT chest showed ground glass attenuation.    By October, he had lost 20lb and started having tingling of the feet and numbness.  He has constant tingling/numbness over the toes, soles, and extending into the dorsum of the feet, worse on the left.  He has intermittent sharp pain and burning sensation. He denies any weakness, imbalance, or falls.  He tends to be very careful when walking because tactile stimuli can trigger tingling pain of the feet.  On one occasion, he was unable to locate his phone which was ultimately found to be in his shoe on vibrate, but he could not feel it.  There is no severe nerve pain that preceded these symptoms.  He takes gabapentin 324m at bedtime which helps.  He also has very rare spells of numbness over the fingertips.  He  denies any weakness of the hands.   He was evaluated by Dr. BAmil Amenwho diagnosed him with microscopic polyangiitis.  He completed weekly Rituxan treatments in November 2018 and remains of tapering dose of prednisone 468mwith plans for 60m52maper every 2 weeks.  His treatment has helped generalized myalgias and malaise.   Despite this, he continues to feel that his breathing and neuropathy has not stabilized, and continue to progress.     He denies any new headaches, vision changes, facial paresthesias, or dysphagia/dysarthria.    He drinks alcohol only occasionally.  No family history of neuropathy or personal history of diabetes.   UPDATE 12/11/2017:  He is doing well since starting cellcept 1500m9mice which has helped his breathing as well as overall functioning.  He continues to be on rituximab and tapering dose of prednisone which is down to 20mg69mly. He reports being much more active and is trying to loose weight and despite being on prednisone, he has lost 25lb.  He continues to have numbness of the feet which is no worse than before.  He denies painful or tingling paresthesia of the feet.  Balance is doing much better.  He continues to walk unassisted and has not suffered any falls.    UPDATE 04/23/2018:  He is here for follow-up visit.  He has been tapering prednisone and is down to 7.60mg a7mhe instruction of rheumatology and has been doing well. 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  In fact, he is no longer being considered for lung transplant.  Shortness of breath is doing better as well.  We will he has appreciated increased sensation in the feet and balance is improved.  He does regular balance exercises.   He has not suffered falls and walks unassisted.  He made a trip to the beach and was able to walk well in the sand.  Pain is well controlled on gabapentin 36m twice daily.    Medications:    Current Outpatient Medications on File Prior to Visit  Medication Sig Dispense Refill  . calcium citrate-vitamin D (CITRACAL+D) 315-200 MG-UNIT tablet Take 1 tablet by mouth 2 (two) times daily.    .Marland Kitchengabapentin (NEURONTIN) 300 MG capsule Take 1 capsule (300 mg total) by mouth 2 (two) times daily. 270 capsule 3  . mycophenolate (CELLCEPT) 500 MG tablet Take 750 mg by mouth 2 (two) times daily.     . pantoprazole (PROTONIX) 40 MG tablet Take 30- 60 min before your first and last meals of the day (Patient taking differently: Take 40 mg by mouth daily before breakfast. Take 30- 60 min before your first and last meals of the day) 60 tablet 2  . predniSONE (DELTASONE) 10 MG tablet Take 4 tablets (40 mg total) by mouth daily with breakfast. (Patient taking differently: Take 7.5 mg by mouth daily with breakfast. 2.5 tablets.) 120 tablet 1  . Turmeric 500 MG TABS Take 1,000 mg by mouth at bedtime.      No current facility-administered medications on file prior to visit.     Allergies: No Known Allergies  Review of Systems:  CONSTITUTIONAL: No fevers, chills, night sweats, or weight loss.  EYES: No visual changes or eye pain ENT: No hearing changes.  No history of nose bleeds.   RESPIRATORY: no cough, wheezing, shortness of breath.   CARDIOVASCULAR: Negative for chest pain, and palpitations.   GI: Negative for abdominal discomfort, blood in stools or black stools.  No recent change in bowel habits.   GU:  No history of incontinence.   MUSCLOSKELETAL: No history of joint pain or swelling.  No myalgias.   SKIN: Negative for lesions, rash, and itching.   ENDOCRINE: Negative for cold or heat intolerance, polydipsia or goiter.   PSYCH:  No depression or anxiety symptoms.   NEURO: As Above.   Vital Signs:  BP 120/80   Pulse 88   Ht 6' (1.829 m)   Wt 249 lb 4 oz (113.1 kg)   SpO2 95%   BMI 33.80 kg/m    General Medical Exam:   General:  Well appearing, comfortable  Eyes/ENT: see cranial  nerve examination.   Neck: No masses appreciated.  Full range of motion without tenderness.  No carotid bruits. Respiratory:  Clear to auscultation, good air entry bilaterally.   Cardiac:  Regular rate and rhythm, no murmur.   Ext:  No edema  Neurological Exam: MENTAL STATUS including orientation to time, place, person, recent and remote memory, attention span and concentration, language, and fund of knowledge is normal.  Speech is not dysarthric.  CRANIAL NERVES: Pupils equal round and reactive to light.  Normal conjugate, extra-ocular eye movements in all directions of gaze.  No ptosis.  Face is symmetric. Palate elevates symmetrically.  Tongue is midline.  MOTOR:  Mild bilateral gastrocnemius atrophy, no fasciculations or abnormal movements.  No pronator drift.  Tone is normal.    Right Upper Extremity:    Left  Upper Extremity:    Deltoid  5/5   Deltoid  5/5   Biceps  5/5   Biceps  5/5   Triceps  5/5   Triceps  5/5   Wrist extensors  5/5   Wrist extensors  5/5   Wrist flexors  5/5   Wrist flexors  5/5   Finger extensors  5/5   Finger extensors  5/5   Finger flexors  5/5   Finger flexors  5/5   Dorsal interossei  5/5   Dorsal interossei  5/5   Abductor pollicis  5/5   Abductor pollicis  5/5   Tone (Ashworth scale)  0  Tone (Ashworth scale)  0   Right Lower Extremity:    Left Lower Extremity:    Hip flexors  5/5   Hip flexors  5/5   Hip extensors  5/5   Hip extensors  5/5   Knee flexors  5/5   Knee flexors  5/5   Knee extensors  5/5   Knee extensors  5/5   Dorsiflexors  5/5   Dorsiflexors  5/5   Plantarflexors  5/5   Plantarflexors  5/5   Toe extensors  5-/5   Toe extensors  5-/5   Toe flexors  5-/5   Toe flexors  5-/5   Tone (Ashworth scale)  0  Tone (Ashworth scale)  0    MSRs:  Reflexes are 2+/4 throughout, and trace at the Achilles (improved).  SENSORY:  Vibration is 100% at the knees and ankles, ~50% at the great toe (improved).  Temperature and pin prick is intact  throughout the legs and dorsum of the feet, pin prick is mildly reduced over the soles.  Proprioception is intact at the great toe bilaterally.  Rhomberg testing is negative.  COORDINATION/GAIT:  Normal finger-to- nose-finger.  Intact rapid alternating movements bilaterally.  Gait narrow based and stable. Tandem gait is intact.  Stressed gait intact.   Data: Labs 05/21/2017:  p-ANCA 1:320*, MPO antibody 25.7*, aldolase 4.5, dsDNA neg, ANA neg, C3/4 neg,   CT chest 08/21/2017: 1. 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 Findings are technically indeterminate for usual interstitial pneumonia (UIP), although this is not strongly favored given the absence of a clear basilar gradient, the absence of frank honeycombing and the previously noted perilobular consolidation. Recommend a follow-up high-resolution chest CT in 12 months to assess ongoing temporal pattern stability.  2. Stable subpleural 8 mm right middle lobe pulmonary nodule, for which 3 month stability has been demonstrated, probably benign. This nodule can be reassessed on follow-up chest CT in 12 months. 3. Two vessel coronary atherosclerosis.  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  There are no signs of neuropathy or entrapment neuropathy in the left upper extremity.   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  - He has minimal pain which is well controlled on gabapentin 300 mg twice  daily  - Continue ongoing care as per rheumatology and pulmonology.  Follow-up in 6 months.   Thank you for allowing me to participate in patient's care.  If I can answer any additional questions, I would be pleased to do so.    Sincerely,    Donika K. Posey Pronto, DO

## 2018-04-23 NOTE — Patient Instructions (Signed)
You look great!!  Your neuropathy is slowly improving.  Continue gabapentin 300mg  twice daily  Continue balance exercises  Return to clinic in 6 months

## 2018-04-27 ENCOUNTER — Encounter (HOSPITAL_COMMUNITY): Payer: Self-pay

## 2018-04-27 ENCOUNTER — Encounter (HOSPITAL_COMMUNITY)
Admission: RE | Admit: 2018-04-27 | Discharge: 2018-04-27 | Disposition: A | Discharge status: Home / Self Care | Payer: Medicare Other | Source: Ambulatory Visit | Attending: Pulmonary Disease | Admitting: Pulmonary Disease

## 2018-04-29 ENCOUNTER — Encounter (HOSPITAL_COMMUNITY)
Admission: RE | Admit: 2018-04-29 | Discharge: 2018-04-29 | Disposition: A | Discharge status: Home / Self Care | Payer: Medicare Other | Source: Ambulatory Visit | Attending: Pulmonary Disease | Admitting: Pulmonary Disease

## 2018-04-29 ENCOUNTER — Encounter (HOSPITAL_COMMUNITY): Payer: Self-pay

## 2018-05-04 ENCOUNTER — Encounter (HOSPITAL_COMMUNITY): Payer: Self-pay

## 2018-05-04 ENCOUNTER — Encounter (HOSPITAL_COMMUNITY)
Admission: RE | Admit: 2018-05-04 | Discharge: 2018-05-04 | Disposition: A | Discharge status: Home / Self Care | Payer: Medicare Other | Source: Ambulatory Visit | Attending: Pulmonary Disease | Admitting: Pulmonary Disease

## 2018-05-04 DIAGNOSIS — H2511 Age-related nuclear cataract, right eye: Secondary | ICD-10-CM | POA: Diagnosis not present

## 2018-05-06 ENCOUNTER — Encounter (HOSPITAL_COMMUNITY): Payer: Self-pay

## 2018-05-10 DIAGNOSIS — H25811 Combined forms of age-related cataract, right eye: Secondary | ICD-10-CM | POA: Diagnosis not present

## 2018-05-10 DIAGNOSIS — H2511 Age-related nuclear cataract, right eye: Secondary | ICD-10-CM | POA: Diagnosis not present

## 2018-05-11 ENCOUNTER — Encounter (HOSPITAL_COMMUNITY): Payer: Self-pay

## 2018-05-13 ENCOUNTER — Encounter (HOSPITAL_COMMUNITY): Payer: Self-pay

## 2018-05-13 ENCOUNTER — Encounter (HOSPITAL_COMMUNITY)
Admission: RE | Admit: 2018-05-13 | Discharge: 2018-05-13 | Disposition: A | Discharge status: Home / Self Care | Payer: Medicare Other | Source: Ambulatory Visit | Attending: Pulmonary Disease | Admitting: Pulmonary Disease

## 2018-05-18 ENCOUNTER — Encounter (HOSPITAL_COMMUNITY): Payer: Self-pay

## 2018-05-18 ENCOUNTER — Encounter (HOSPITAL_COMMUNITY)
Admission: RE | Admit: 2018-05-18 | Discharge: 2018-05-18 | Disposition: A | Discharge status: Home / Self Care | Payer: Self-pay | Source: Ambulatory Visit | Attending: Pulmonary Disease | Admitting: Pulmonary Disease

## 2018-05-18 DIAGNOSIS — R0602 Shortness of breath: Secondary | ICD-10-CM | POA: Insufficient documentation

## 2018-05-18 DIAGNOSIS — J849 Interstitial pulmonary disease, unspecified: Secondary | ICD-10-CM | POA: Insufficient documentation

## 2018-05-20 ENCOUNTER — Encounter (HOSPITAL_COMMUNITY): Payer: Self-pay

## 2018-05-20 ENCOUNTER — Encounter (HOSPITAL_COMMUNITY)
Admission: RE | Admit: 2018-05-20 | Discharge: 2018-05-20 | Disposition: A | Discharge status: Home / Self Care | Payer: Medicare Other | Source: Ambulatory Visit | Attending: Pulmonary Disease | Admitting: Pulmonary Disease

## 2018-05-21 DIAGNOSIS — Z01818 Encounter for other preprocedural examination: Secondary | ICD-10-CM | POA: Diagnosis not present

## 2018-05-21 DIAGNOSIS — J849 Interstitial pulmonary disease, unspecified: Secondary | ICD-10-CM | POA: Diagnosis not present

## 2018-05-21 DIAGNOSIS — Z23 Encounter for immunization: Secondary | ICD-10-CM | POA: Diagnosis not present

## 2018-05-21 DIAGNOSIS — I776 Arteritis, unspecified: Secondary | ICD-10-CM | POA: Diagnosis not present

## 2018-05-21 DIAGNOSIS — R0600 Dyspnea, unspecified: Secondary | ICD-10-CM | POA: Diagnosis not present

## 2018-05-25 ENCOUNTER — Encounter (HOSPITAL_COMMUNITY)
Admission: RE | Admit: 2018-05-25 | Discharge: 2018-05-25 | Disposition: A | Discharge status: Home / Self Care | Payer: Self-pay | Source: Ambulatory Visit | Attending: Pulmonary Disease | Admitting: Pulmonary Disease

## 2018-05-25 ENCOUNTER — Encounter (HOSPITAL_COMMUNITY): Payer: Self-pay

## 2018-05-27 ENCOUNTER — Encounter (HOSPITAL_COMMUNITY): Payer: Self-pay

## 2018-05-27 DIAGNOSIS — I776 Arteritis, unspecified: Secondary | ICD-10-CM | POA: Diagnosis not present

## 2018-05-27 DIAGNOSIS — J8489 Other specified interstitial pulmonary diseases: Secondary | ICD-10-CM | POA: Diagnosis not present

## 2018-05-27 DIAGNOSIS — M318 Other specified necrotizing vasculopathies: Secondary | ICD-10-CM | POA: Diagnosis not present

## 2018-06-01 ENCOUNTER — Encounter (HOSPITAL_COMMUNITY): Payer: Self-pay

## 2018-06-03 ENCOUNTER — Encounter (HOSPITAL_COMMUNITY): Payer: Self-pay

## 2018-06-03 ENCOUNTER — Encounter (HOSPITAL_COMMUNITY)
Admission: RE | Admit: 2018-06-03 | Discharge: 2018-06-03 | Disposition: A | Discharge status: Home / Self Care | Payer: Medicare Other | Source: Ambulatory Visit | Attending: Pulmonary Disease | Admitting: Pulmonary Disease

## 2018-06-08 ENCOUNTER — Encounter (HOSPITAL_COMMUNITY)
Admission: RE | Admit: 2018-06-08 | Discharge: 2018-06-08 | Disposition: A | Discharge status: Home / Self Care | Payer: Medicare Other | Source: Ambulatory Visit | Attending: Pulmonary Disease | Admitting: Pulmonary Disease

## 2018-06-08 ENCOUNTER — Encounter (HOSPITAL_COMMUNITY): Payer: Self-pay

## 2018-06-10 ENCOUNTER — Encounter (HOSPITAL_COMMUNITY): Payer: Self-pay

## 2018-06-10 DIAGNOSIS — J8489 Other specified interstitial pulmonary diseases: Secondary | ICD-10-CM | POA: Diagnosis not present

## 2018-06-10 DIAGNOSIS — Z5112 Encounter for antineoplastic immunotherapy: Secondary | ICD-10-CM | POA: Diagnosis not present

## 2018-06-10 DIAGNOSIS — M318 Other specified necrotizing vasculopathies: Secondary | ICD-10-CM | POA: Diagnosis not present

## 2018-06-15 ENCOUNTER — Encounter (HOSPITAL_COMMUNITY): Payer: Self-pay

## 2018-06-17 ENCOUNTER — Encounter (HOSPITAL_COMMUNITY): Payer: Self-pay

## 2018-06-22 ENCOUNTER — Encounter (HOSPITAL_COMMUNITY): Payer: Self-pay

## 2018-06-22 DIAGNOSIS — J849 Interstitial pulmonary disease, unspecified: Secondary | ICD-10-CM | POA: Insufficient documentation

## 2018-06-22 DIAGNOSIS — R0602 Shortness of breath: Secondary | ICD-10-CM | POA: Insufficient documentation

## 2018-06-24 ENCOUNTER — Encounter (HOSPITAL_COMMUNITY): Payer: Self-pay

## 2018-06-29 ENCOUNTER — Encounter (HOSPITAL_COMMUNITY): Payer: Self-pay

## 2018-06-29 ENCOUNTER — Encounter (HOSPITAL_COMMUNITY)
Admission: RE | Admit: 2018-06-29 | Discharge: 2018-06-29 | Disposition: A | Discharge status: Home / Self Care | Payer: Medicare Other | Source: Ambulatory Visit | Attending: Pulmonary Disease | Admitting: Pulmonary Disease

## 2018-07-01 ENCOUNTER — Encounter (HOSPITAL_COMMUNITY)
Admission: RE | Admit: 2018-07-01 | Discharge: 2018-07-01 | Disposition: A | Discharge status: Home / Self Care | Payer: Self-pay | Source: Ambulatory Visit | Attending: Pulmonary Disease | Admitting: Pulmonary Disease

## 2018-07-01 ENCOUNTER — Encounter (HOSPITAL_COMMUNITY): Payer: Self-pay

## 2018-07-06 ENCOUNTER — Encounter (HOSPITAL_COMMUNITY)
Admission: RE | Admit: 2018-07-06 | Discharge: 2018-07-06 | Disposition: A | Discharge status: Home / Self Care | Payer: Self-pay | Source: Ambulatory Visit | Attending: Pulmonary Disease | Admitting: Pulmonary Disease

## 2018-07-06 ENCOUNTER — Encounter (HOSPITAL_COMMUNITY): Payer: Self-pay

## 2018-07-08 ENCOUNTER — Encounter (HOSPITAL_COMMUNITY): Payer: Self-pay

## 2018-07-08 ENCOUNTER — Other Ambulatory Visit: Payer: Self-pay | Admitting: Family Medicine

## 2018-07-08 ENCOUNTER — Ambulatory Visit
Admission: RE | Admit: 2018-07-08 | Discharge: 2018-07-08 | Disposition: A | Discharge status: Home / Self Care | Payer: Medicare Other | Source: Ambulatory Visit | Attending: Family Medicine | Admitting: Family Medicine

## 2018-07-08 DIAGNOSIS — J984 Other disorders of lung: Secondary | ICD-10-CM | POA: Diagnosis not present

## 2018-07-08 DIAGNOSIS — R05 Cough: Secondary | ICD-10-CM

## 2018-07-08 DIAGNOSIS — R059 Cough, unspecified: Secondary | ICD-10-CM

## 2018-07-12 DIAGNOSIS — R768 Other specified abnormal immunological findings in serum: Secondary | ICD-10-CM | POA: Diagnosis not present

## 2018-07-12 DIAGNOSIS — Z6834 Body mass index (BMI) 34.0-34.9, adult: Secondary | ICD-10-CM | POA: Diagnosis not present

## 2018-07-12 DIAGNOSIS — J849 Interstitial pulmonary disease, unspecified: Secondary | ICD-10-CM | POA: Diagnosis not present

## 2018-07-13 ENCOUNTER — Encounter (HOSPITAL_COMMUNITY)
Admission: RE | Admit: 2018-07-13 | Discharge: 2018-07-13 | Disposition: A | Discharge status: Home / Self Care | Payer: Self-pay | Source: Ambulatory Visit | Attending: Pulmonary Disease | Admitting: Pulmonary Disease

## 2018-07-13 ENCOUNTER — Encounter (HOSPITAL_COMMUNITY): Payer: Self-pay

## 2018-07-20 ENCOUNTER — Encounter (HOSPITAL_COMMUNITY)
Admission: RE | Admit: 2018-07-20 | Discharge: 2018-07-20 | Disposition: A | Discharge status: Home / Self Care | Payer: Self-pay | Source: Ambulatory Visit | Attending: Pulmonary Disease | Admitting: Pulmonary Disease

## 2018-07-20 DIAGNOSIS — J849 Interstitial pulmonary disease, unspecified: Secondary | ICD-10-CM | POA: Insufficient documentation

## 2018-07-20 DIAGNOSIS — R0602 Shortness of breath: Secondary | ICD-10-CM | POA: Insufficient documentation

## 2018-07-22 ENCOUNTER — Encounter (HOSPITAL_COMMUNITY)
Admission: RE | Admit: 2018-07-22 | Discharge: 2018-07-22 | Disposition: A | Discharge status: Home / Self Care | Payer: Self-pay | Source: Ambulatory Visit | Attending: Pulmonary Disease | Admitting: Pulmonary Disease

## 2018-07-23 DIAGNOSIS — I776 Arteritis, unspecified: Secondary | ICD-10-CM | POA: Diagnosis not present

## 2018-07-23 DIAGNOSIS — R5383 Other fatigue: Secondary | ICD-10-CM | POA: Diagnosis not present

## 2018-07-23 DIAGNOSIS — J849 Interstitial pulmonary disease, unspecified: Secondary | ICD-10-CM | POA: Diagnosis not present

## 2018-07-23 DIAGNOSIS — R05 Cough: Secondary | ICD-10-CM | POA: Diagnosis not present

## 2018-07-23 DIAGNOSIS — Z6835 Body mass index (BMI) 35.0-35.9, adult: Secondary | ICD-10-CM | POA: Diagnosis not present

## 2018-07-23 DIAGNOSIS — R0600 Dyspnea, unspecified: Secondary | ICD-10-CM | POA: Diagnosis not present

## 2018-07-23 DIAGNOSIS — K219 Gastro-esophageal reflux disease without esophagitis: Secondary | ICD-10-CM | POA: Diagnosis not present

## 2018-07-27 ENCOUNTER — Encounter (HOSPITAL_COMMUNITY): Payer: Self-pay

## 2018-07-29 ENCOUNTER — Encounter (HOSPITAL_COMMUNITY)
Admission: RE | Admit: 2018-07-29 | Discharge: 2018-07-29 | Disposition: A | Discharge status: Home / Self Care | Payer: Self-pay | Source: Ambulatory Visit | Attending: Pulmonary Disease | Admitting: Pulmonary Disease

## 2018-08-03 ENCOUNTER — Encounter (HOSPITAL_COMMUNITY)
Admission: RE | Admit: 2018-08-03 | Discharge: 2018-08-03 | Disposition: A | Discharge status: Home / Self Care | Payer: Self-pay | Source: Ambulatory Visit | Attending: Pulmonary Disease | Admitting: Pulmonary Disease

## 2018-08-05 ENCOUNTER — Encounter (HOSPITAL_COMMUNITY)
Admission: RE | Admit: 2018-08-05 | Discharge: 2018-08-05 | Disposition: A | Discharge status: Home / Self Care | Payer: Self-pay | Source: Ambulatory Visit | Attending: Pulmonary Disease | Admitting: Pulmonary Disease

## 2018-08-10 ENCOUNTER — Encounter (HOSPITAL_COMMUNITY): Payer: Self-pay

## 2018-08-12 ENCOUNTER — Encounter (HOSPITAL_COMMUNITY): Payer: Self-pay

## 2018-08-17 ENCOUNTER — Encounter (HOSPITAL_COMMUNITY): Payer: Self-pay

## 2018-08-19 ENCOUNTER — Encounter (HOSPITAL_COMMUNITY)
Admission: RE | Admit: 2018-08-19 | Discharge: 2018-08-19 | Disposition: A | Discharge status: Home / Self Care | Payer: Medicare Other | Source: Ambulatory Visit | Attending: Pulmonary Disease | Admitting: Pulmonary Disease

## 2018-08-19 DIAGNOSIS — R0602 Shortness of breath: Secondary | ICD-10-CM | POA: Insufficient documentation

## 2018-08-19 DIAGNOSIS — J849 Interstitial pulmonary disease, unspecified: Secondary | ICD-10-CM | POA: Insufficient documentation

## 2018-08-24 ENCOUNTER — Encounter (HOSPITAL_COMMUNITY)
Admission: RE | Admit: 2018-08-24 | Discharge: 2018-08-24 | Disposition: A | Discharge status: Home / Self Care | Payer: Medicare Other | Source: Ambulatory Visit | Attending: Pulmonary Disease | Admitting: Pulmonary Disease

## 2018-08-26 ENCOUNTER — Encounter (HOSPITAL_COMMUNITY): Payer: Self-pay

## 2018-08-31 ENCOUNTER — Encounter (HOSPITAL_COMMUNITY)
Admission: RE | Admit: 2018-08-31 | Discharge: 2018-08-31 | Disposition: A | Discharge status: Home / Self Care | Payer: Medicare Other | Source: Ambulatory Visit | Attending: Pulmonary Disease | Admitting: Pulmonary Disease

## 2018-09-02 ENCOUNTER — Encounter (HOSPITAL_COMMUNITY)
Admission: RE | Admit: 2018-09-02 | Discharge: 2018-09-02 | Disposition: A | Discharge status: Home / Self Care | Payer: Medicare Other | Source: Ambulatory Visit | Attending: Pulmonary Disease | Admitting: Pulmonary Disease

## 2018-09-07 ENCOUNTER — Encounter (HOSPITAL_COMMUNITY): Payer: Self-pay

## 2018-09-09 ENCOUNTER — Encounter (HOSPITAL_COMMUNITY): Payer: Self-pay

## 2018-09-14 ENCOUNTER — Encounter (HOSPITAL_COMMUNITY)
Admission: RE | Admit: 2018-09-14 | Discharge: 2018-09-14 | Disposition: A | Discharge status: Home / Self Care | Payer: Medicare Other | Source: Ambulatory Visit | Attending: Pulmonary Disease | Admitting: Pulmonary Disease

## 2018-09-16 ENCOUNTER — Encounter (HOSPITAL_COMMUNITY)
Admission: RE | Admit: 2018-09-16 | Discharge: 2018-09-16 | Disposition: A | Discharge status: Home / Self Care | Payer: Medicare Other | Source: Ambulatory Visit | Attending: Pulmonary Disease | Admitting: Pulmonary Disease

## 2018-09-21 ENCOUNTER — Encounter (HOSPITAL_COMMUNITY)
Admission: RE | Admit: 2018-09-21 | Discharge: 2018-09-21 | Disposition: A | Discharge status: Home / Self Care | Payer: Medicare Other | Source: Ambulatory Visit | Attending: Pulmonary Disease | Admitting: Pulmonary Disease

## 2018-09-21 DIAGNOSIS — R0602 Shortness of breath: Secondary | ICD-10-CM | POA: Insufficient documentation

## 2018-09-21 DIAGNOSIS — J849 Interstitial pulmonary disease, unspecified: Secondary | ICD-10-CM | POA: Insufficient documentation

## 2018-09-23 ENCOUNTER — Encounter (HOSPITAL_COMMUNITY): Payer: Self-pay

## 2018-09-28 ENCOUNTER — Encounter (HOSPITAL_COMMUNITY): Payer: Self-pay

## 2018-09-30 ENCOUNTER — Encounter (HOSPITAL_COMMUNITY): Payer: Self-pay

## 2018-10-05 ENCOUNTER — Encounter (HOSPITAL_COMMUNITY)
Admission: RE | Admit: 2018-10-05 | Discharge: 2018-10-05 | Disposition: A | Discharge status: Home / Self Care | Payer: Self-pay | Source: Ambulatory Visit | Attending: Pulmonary Disease | Admitting: Pulmonary Disease

## 2018-10-07 ENCOUNTER — Encounter (HOSPITAL_COMMUNITY)
Admission: RE | Admit: 2018-10-07 | Discharge: 2018-10-07 | Disposition: A | Discharge status: Home / Self Care | Payer: Medicare Other | Source: Ambulatory Visit | Attending: Pulmonary Disease | Admitting: Pulmonary Disease

## 2018-10-12 ENCOUNTER — Encounter (HOSPITAL_COMMUNITY): Payer: Self-pay

## 2018-10-14 ENCOUNTER — Encounter (HOSPITAL_COMMUNITY)
Admission: RE | Admit: 2018-10-14 | Discharge: 2018-10-14 | Disposition: A | Discharge status: Home / Self Care | Payer: Medicare Other | Source: Ambulatory Visit | Attending: Pulmonary Disease | Admitting: Pulmonary Disease

## 2018-10-19 ENCOUNTER — Encounter (HOSPITAL_COMMUNITY)
Admission: RE | Admit: 2018-10-19 | Discharge: 2018-10-19 | Disposition: A | Discharge status: Home / Self Care | Payer: Medicare Other | Source: Ambulatory Visit | Attending: Pulmonary Disease | Admitting: Pulmonary Disease

## 2018-10-19 DIAGNOSIS — J849 Interstitial pulmonary disease, unspecified: Secondary | ICD-10-CM | POA: Insufficient documentation

## 2018-10-19 DIAGNOSIS — R0602 Shortness of breath: Secondary | ICD-10-CM | POA: Insufficient documentation

## 2018-10-21 ENCOUNTER — Encounter (HOSPITAL_COMMUNITY): Payer: Self-pay

## 2018-10-21 NOTE — Progress Notes (Signed)
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 right-handed Caucasian Animal nutritionist with history of interstitial lung disease and microscopic polyangiitis returning to the clinic for follow-up of vasculitis neuropathy.  The patient was accompanied to the clinic by self.  History of present illness: Patient was in his usual state of health until several years ago and then developed a chronic cough.  In July 2018, he was hiking in [place], [place] and was unable to keep up with his wife while hiking because of fatigue and shortness of breath.  By August, he was having headaches, generalizes muscle aches, and joint pain.  Initially, there was treated for community acquired pneumonia as well as [facility] spotted fever, but this diagnosis was later retracted by [name], Infectious Disease who he saw in October.  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 CT chest showed ground glass attenuation.    By October, he had lost 20lb and started having tingling of the feet and numbness.  He has constant tingling/numbness over the toes, soles, and extending into the dorsum of the feet, worse on the left.  He has intermittent sharp pain and burning sensation. He tends to be very careful when walking because tactile stimuli can trigger tingling pain of the feet.  On one occasion, he was unable to locate his phone which was ultimately found to be in his shoe on vibrate, but he could not feel it.  He takes gabapentin 383m at bedtime which helps.  He also has very rare spells of numbness over the fingertips.  He denies any weakness of the hands.   He was evaluated by Dr. BAmil Amenwho diagnosed him with microscopic  polyangiitis.  He completed weekly Rituxan treatments in November 2018 and remains of tapering dose of prednisone 443mwith plans for 81m681maper every 2 weeks.  His treatment has helped generalized myalgias and malaise.   Despite this, he continues to feel that his breathing and neuropathy has not stabilized, and continue to progress.     UPDATE 12/11/2017:  He is doing well since starting cellcept 1500m54mice which has helped his breathing as well as overall functioning.  He continues to be on rituximab and tapering dose of prednisone which is down to 20mg53mly. He reports being much more active and is trying to loose weight and despite being on prednisone, he has lost 25lb.  He continues to have numbness of the feet which is no worse than before.  Balance is doing much better.    UPDATE 04/23/2018:   He has been tapering prednisone and is down to 7.81mg a29mhe instruction of rheumatology and has been doing well. 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  In fact, he is no longer being considered for lung transplant.  Shortness of breath is doing better as well.  We will he has appreciated increased sensation in the feet and balance is improved.   UPDATE 10/22/2018:  He is here for 6 month follow-up.  He is doing well and has been on a tapering dose of prednisone down to 81mg.  69mremains on Cellcept 1500mg tw181mdaily and  rituximab every 6 months.  His neuropathy is stable, with no worsening. He has numbness remains in the feet. He has not suffered any falls and denies any pain.  No new complaints.    Medications:  Current Outpatient Medications on File Prior to Visit  Medication Sig Dispense Refill  . calcium citrate-vitamin D (CITRACAL+D) 315-200 MG-UNIT tablet Take 1 tablet by mouth 2 (two) times daily.    . Cholecalciferol (VITAMIN D3) 25 MCG (1000 UT) CAPS Take by mouth.    . mycophenolate (CELLCEPT) 500 MG tablet  Take 1,500 mg by mouth 2 (two) times daily.     . pantoprazole (PROTONIX) 40 MG tablet Take 30- 60 min before your first and last meals of the day (Patient taking differently: Take 40 mg by mouth 2 (two) times daily. Take 30- 60 min before your first and last meals of the day) 60 tablet 2  . predniSONE (DELTASONE) 10 MG tablet Take 4 tablets (40 mg total) by mouth daily with breakfast. (Patient taking differently: Take 5 mg by mouth daily with breakfast. 2.5 tablets.) 120 tablet 1  . Turmeric 500 MG TABS Take 1,000 mg by mouth at bedtime.      No current facility-administered medications on file prior to visit.     Allergies: No Known Allergies  Review of Systems:  CONSTITUTIONAL: No fevers, chills, night sweats, or weight loss.  EYES: No visual changes or eye pain ENT: No hearing changes.  No history of nose bleeds.   RESPIRATORY: No cough, wheezing and shortness of breath.   CARDIOVASCULAR: Negative for chest pain, and palpitations.   GI: Negative for abdominal discomfort, blood in stools or black stools.  No recent change in bowel habits.   GU:  No history of incontinence.   MUSCLOSKELETAL: No history of joint pain or swelling.  No myalgias.   SKIN: Negative for lesions, rash, and itching.   ENDOCRINE: Negative for cold or heat intolerance, polydipsia or goiter.   PSYCH:  No depression or anxiety symptoms.   NEURO: As Above.   Vital Signs:  BP 124/80   Pulse 68   Ht 6' (1.829 m)   Wt 247 lb 4 oz (112.2 kg)   SpO2 96%   BMI 33.53 kg/m    Neurological Exam: MENTAL STATUS including orientation to time, place, person, recent and remote memory, attention span and concentration, language, and fund of knowledge is normal.  Speech is not dysarthric.  CRANIAL NERVES:  Face is symmetric  MOTOR:  Mild bilateral gastrocnemius atrophy, no fasciculations or abnormal movements.  No pronator drift.  Tone is normal.    Right Upper Extremity:    Left Upper Extremity:    Deltoid  5/5    Deltoid  5/5   Biceps  5/5   Biceps  5/5   Triceps  5/5   Triceps  5/5   Wrist extensors  5/5   Wrist extensors  5/5   Wrist flexors  5/5   Wrist flexors  5/5   Finger extensors  5/5   Finger extensors  5/5   Finger flexors  5/5   Finger flexors  5/5   Dorsal interossei  5/5   Dorsal interossei  5/5   Abductor pollicis  5/5   Abductor pollicis  5/5   Tone (Ashworth scale)  0  Tone (Ashworth scale)  0   Right Lower Extremity:    Left Lower Extremity:    Hip flexors  5/5   Hip flexors  5/5  Hip extensors  5/5   Hip extensors  5/5   Knee flexors  5/5   Knee flexors  5/5   Knee extensors  5/5   Knee extensors  5/5   Dorsiflexors  5/5   Dorsiflexors  5/5   Plantarflexors  5/5   Plantarflexors  5/5   Toe extensors  5/5   Toe extensors  5/5   Toe flexors  5/5   Toe flexors  5/5   Tone (Ashworth scale)  0  Tone (Ashworth scale)  0    MSRs:  Reflexes 2+/4 are throughout, and trace at the Achilles (improved).  SENSORY:  Vibration is 100% at the knees and ankles and 75% at the great toe (improved).  Temperature and pin prick is intact in the feet.  Rhomberg testing is negative.  COORDINATION/GAIT:  Intact rapid alternating movements bilaterally.  Gait is narrow based, tandem and stressed gait intact.   Data: Labs 05/21/2017:  p-ANCA 1:320*, MPO antibody 25.7*, aldolase 4.5, dsDNA neg, ANA neg, C3/4 neg,   CT chest 08/21/2017: 1. 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 Findings are technically indeterminate for usual interstitial pneumonia (UIP), although this is not strongly favored given the absence of a clear basilar gradient, the absence of frank honeycombing and the previously noted perilobular consolidation. Recommend a follow-up high-resolution chest CT in 12 months to assess ongoing temporal pattern stability.  2. Stable subpleural 8 mm right middle lobe  pulmonary nodule, for which 3 month stability has been demonstrated, probably benign. This nodule can be reassessed on follow-up chest CT in 12 months. 3. Two vessel coronary atherosclerosis.  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  There are no signs of neuropathy or entrapment neuropathy in the left upper extremity.   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  He is on immunotherapy with rituximab every 6 months, CellCept 1500 mg twice a day, and tapering dose of prednisone at 38m daily.  - Clinically, he is doing great with improved paresthesia, balance, and weakness.  - Reduce gabapentin to 3076mat bedtime, if pain gets worse ok to increase back to 30020mwice daily  - Continue exercises for balance and leg strengthening  - Continue ongoing care as per rheumatology and pulmonology.  Return to clinic in 6 months   Thank you for allowing me to participate in patient's care.  If I can answer any additional questions, I would be pleased to do so.    Sincerely,     K. PatPosey ProntoO

## 2018-10-22 ENCOUNTER — Encounter: Payer: Self-pay | Admitting: Neurology

## 2018-10-22 ENCOUNTER — Ambulatory Visit (INDEPENDENT_AMBULATORY_CARE_PROVIDER_SITE_OTHER): Payer: Medicare Other | Admitting: Neurology

## 2018-10-22 VITALS — BP 124/80 | HR 68 | Ht 72.0 in | Wt 247.2 lb

## 2018-10-22 DIAGNOSIS — M317 Microscopic polyangiitis: Secondary | ICD-10-CM | POA: Diagnosis not present

## 2018-10-22 DIAGNOSIS — G619 Inflammatory polyneuropathy, unspecified: Secondary | ICD-10-CM | POA: Diagnosis not present

## 2018-10-22 MED ORDER — GABAPENTIN 300 MG PO CAPS: 300.0000 mg | ORAL_CAPSULE | Freq: Two times a day (BID) | ORAL | 3 refills | Status: DC

## 2018-10-22 NOTE — Patient Instructions (Addendum)
You can reduce gabapentin to 300mg  at bedtime.  If pain gets worse, you can go back to taking 1 tablet twice daily  Return to clinic 6 months

## 2018-10-26 ENCOUNTER — Encounter (HOSPITAL_COMMUNITY)
Admission: RE | Admit: 2018-10-26 | Discharge: 2018-10-26 | Disposition: A | Discharge status: Home / Self Care | Payer: Medicare Other | Source: Ambulatory Visit | Attending: Pulmonary Disease | Admitting: Pulmonary Disease

## 2018-10-28 ENCOUNTER — Other Ambulatory Visit: Payer: Self-pay

## 2018-10-28 ENCOUNTER — Encounter (HOSPITAL_COMMUNITY)
Admission: RE | Admit: 2018-10-28 | Discharge: 2018-10-28 | Disposition: A | Discharge status: Home / Self Care | Payer: Medicare Other | Source: Ambulatory Visit | Attending: Pulmonary Disease | Admitting: Pulmonary Disease

## 2018-10-29 DIAGNOSIS — Z6833 Body mass index (BMI) 33.0-33.9, adult: Secondary | ICD-10-CM | POA: Diagnosis not present

## 2018-10-29 DIAGNOSIS — R0600 Dyspnea, unspecified: Secondary | ICD-10-CM | POA: Diagnosis not present

## 2018-10-29 DIAGNOSIS — Z79899 Other long term (current) drug therapy: Secondary | ICD-10-CM | POA: Diagnosis not present

## 2018-10-29 DIAGNOSIS — R5383 Other fatigue: Secondary | ICD-10-CM | POA: Diagnosis not present

## 2018-10-29 DIAGNOSIS — H269 Unspecified cataract: Secondary | ICD-10-CM | POA: Diagnosis not present

## 2018-10-29 DIAGNOSIS — I776 Arteritis, unspecified: Secondary | ICD-10-CM | POA: Diagnosis not present

## 2018-10-29 DIAGNOSIS — Z7952 Long term (current) use of systemic steroids: Secondary | ICD-10-CM | POA: Diagnosis not present

## 2018-10-29 DIAGNOSIS — E785 Hyperlipidemia, unspecified: Secondary | ICD-10-CM | POA: Diagnosis not present

## 2018-10-29 DIAGNOSIS — R0982 Postnasal drip: Secondary | ICD-10-CM | POA: Diagnosis not present

## 2018-10-29 DIAGNOSIS — E669 Obesity, unspecified: Secondary | ICD-10-CM | POA: Diagnosis not present

## 2018-10-29 DIAGNOSIS — G629 Polyneuropathy, unspecified: Secondary | ICD-10-CM | POA: Diagnosis not present

## 2018-10-29 DIAGNOSIS — J849 Interstitial pulmonary disease, unspecified: Secondary | ICD-10-CM | POA: Diagnosis not present

## 2018-10-29 DIAGNOSIS — Z9981 Dependence on supplemental oxygen: Secondary | ICD-10-CM | POA: Diagnosis not present

## 2018-10-29 DIAGNOSIS — K219 Gastro-esophageal reflux disease without esophagitis: Secondary | ICD-10-CM | POA: Diagnosis not present

## 2018-11-01 ENCOUNTER — Telehealth (HOSPITAL_COMMUNITY): Payer: Self-pay | Admitting: Cardiac Rehabilitation

## 2018-11-01 NOTE — Telephone Encounter (Signed)
Phone call to inform pt of Outpatient Pulmonary Rehab closure for 2 weeks. LM on answering machine.  Andi Hence, RN, BSN Cardiac Pulmonary Rehab

## 2018-11-02 ENCOUNTER — Encounter (HOSPITAL_COMMUNITY): Payer: Self-pay

## 2018-11-04 ENCOUNTER — Encounter (HOSPITAL_COMMUNITY): Payer: Self-pay

## 2018-11-09 ENCOUNTER — Telehealth (HOSPITAL_COMMUNITY): Payer: Self-pay | Admitting: *Deleted

## 2018-11-09 ENCOUNTER — Encounter (HOSPITAL_COMMUNITY): Payer: Self-pay

## 2018-11-11 ENCOUNTER — Encounter (HOSPITAL_COMMUNITY): Payer: Self-pay

## 2018-11-16 ENCOUNTER — Encounter (HOSPITAL_COMMUNITY): Payer: Self-pay

## 2018-11-18 ENCOUNTER — Encounter (HOSPITAL_COMMUNITY): Payer: Self-pay

## 2018-11-23 ENCOUNTER — Encounter (HOSPITAL_COMMUNITY): Payer: Self-pay

## 2018-11-25 ENCOUNTER — Encounter (HOSPITAL_COMMUNITY): Payer: Self-pay

## 2018-11-29 DIAGNOSIS — J8489 Other specified interstitial pulmonary diseases: Secondary | ICD-10-CM | POA: Diagnosis not present

## 2018-11-29 DIAGNOSIS — M318 Other specified necrotizing vasculopathies: Secondary | ICD-10-CM | POA: Diagnosis not present

## 2018-11-29 DIAGNOSIS — I776 Arteritis, unspecified: Secondary | ICD-10-CM | POA: Diagnosis not present

## 2018-11-30 ENCOUNTER — Encounter (HOSPITAL_COMMUNITY): Payer: Self-pay

## 2018-12-02 ENCOUNTER — Encounter (HOSPITAL_COMMUNITY): Payer: Self-pay

## 2018-12-07 ENCOUNTER — Encounter (HOSPITAL_COMMUNITY): Payer: Self-pay

## 2018-12-09 ENCOUNTER — Encounter (HOSPITAL_COMMUNITY): Payer: Self-pay

## 2018-12-14 ENCOUNTER — Encounter (HOSPITAL_COMMUNITY): Payer: Self-pay

## 2018-12-16 ENCOUNTER — Encounter (HOSPITAL_COMMUNITY): Payer: Self-pay

## 2018-12-21 ENCOUNTER — Encounter (HOSPITAL_COMMUNITY): Payer: Self-pay

## 2018-12-23 ENCOUNTER — Encounter (HOSPITAL_COMMUNITY): Payer: Self-pay

## 2018-12-28 ENCOUNTER — Encounter (HOSPITAL_COMMUNITY): Payer: Self-pay

## 2018-12-30 ENCOUNTER — Encounter (HOSPITAL_COMMUNITY): Payer: Self-pay

## 2019-01-04 ENCOUNTER — Encounter (HOSPITAL_COMMUNITY): Payer: Self-pay

## 2019-01-04 DIAGNOSIS — L959 Vasculitis limited to the skin, unspecified: Secondary | ICD-10-CM | POA: Diagnosis not present

## 2019-01-06 ENCOUNTER — Encounter (HOSPITAL_COMMUNITY): Payer: Self-pay

## 2019-01-11 ENCOUNTER — Encounter (HOSPITAL_COMMUNITY): Payer: Self-pay

## 2019-01-13 ENCOUNTER — Encounter (HOSPITAL_COMMUNITY): Payer: Self-pay

## 2019-01-18 DIAGNOSIS — R05 Cough: Secondary | ICD-10-CM | POA: Diagnosis not present

## 2019-01-18 DIAGNOSIS — J849 Interstitial pulmonary disease, unspecified: Secondary | ICD-10-CM | POA: Diagnosis not present

## 2019-01-18 DIAGNOSIS — R911 Solitary pulmonary nodule: Secondary | ICD-10-CM | POA: Diagnosis not present

## 2019-01-18 DIAGNOSIS — H47093 Other disorders of optic nerve, not elsewhere classified, bilateral: Secondary | ICD-10-CM | POA: Diagnosis not present

## 2019-01-18 DIAGNOSIS — J8410 Pulmonary fibrosis, unspecified: Secondary | ICD-10-CM | POA: Diagnosis not present

## 2019-01-18 DIAGNOSIS — R0600 Dyspnea, unspecified: Secondary | ICD-10-CM | POA: Diagnosis not present

## 2019-02-11 DIAGNOSIS — R0600 Dyspnea, unspecified: Secondary | ICD-10-CM | POA: Diagnosis not present

## 2019-02-11 DIAGNOSIS — Z79899 Other long term (current) drug therapy: Secondary | ICD-10-CM | POA: Diagnosis not present

## 2019-02-11 DIAGNOSIS — Z6833 Body mass index (BMI) 33.0-33.9, adult: Secondary | ICD-10-CM | POA: Diagnosis not present

## 2019-02-11 DIAGNOSIS — J849 Interstitial pulmonary disease, unspecified: Secondary | ICD-10-CM | POA: Diagnosis not present

## 2019-02-11 DIAGNOSIS — R05 Cough: Secondary | ICD-10-CM | POA: Diagnosis not present

## 2019-02-11 DIAGNOSIS — I776 Arteritis, unspecified: Secondary | ICD-10-CM | POA: Diagnosis not present

## 2019-02-15 ENCOUNTER — Telehealth (HOSPITAL_COMMUNITY): Payer: Self-pay

## 2019-02-17 DIAGNOSIS — Z79899 Other long term (current) drug therapy: Secondary | ICD-10-CM | POA: Diagnosis not present

## 2019-02-17 DIAGNOSIS — Z7951 Long term (current) use of inhaled steroids: Secondary | ICD-10-CM | POA: Diagnosis not present

## 2019-02-17 DIAGNOSIS — K219 Gastro-esophageal reflux disease without esophagitis: Secondary | ICD-10-CM | POA: Diagnosis not present

## 2019-02-17 DIAGNOSIS — R633 Feeding difficulties: Secondary | ICD-10-CM | POA: Diagnosis not present

## 2019-02-17 DIAGNOSIS — R1310 Dysphagia, unspecified: Secondary | ICD-10-CM | POA: Diagnosis not present

## 2019-02-17 DIAGNOSIS — J849 Interstitial pulmonary disease, unspecified: Secondary | ICD-10-CM | POA: Diagnosis not present

## 2019-03-08 DIAGNOSIS — Z1159 Encounter for screening for other viral diseases: Secondary | ICD-10-CM | POA: Diagnosis not present

## 2019-03-10 DIAGNOSIS — K228 Other specified diseases of esophagus: Secondary | ICD-10-CM | POA: Diagnosis not present

## 2019-03-10 DIAGNOSIS — J849 Interstitial pulmonary disease, unspecified: Secondary | ICD-10-CM | POA: Diagnosis not present

## 2019-03-10 DIAGNOSIS — K219 Gastro-esophageal reflux disease without esophagitis: Secondary | ICD-10-CM | POA: Diagnosis not present

## 2019-03-10 DIAGNOSIS — K222 Esophageal obstruction: Secondary | ICD-10-CM | POA: Diagnosis not present

## 2019-04-15 ENCOUNTER — Telehealth: Payer: Self-pay | Admitting: Neurology

## 2019-04-15 NOTE — Telephone Encounter (Signed)
Patient left VM wanting to speak with the nurse. Thanks!

## 2019-04-20 DIAGNOSIS — Z20828 Contact with and (suspected) exposure to other viral communicable diseases: Secondary | ICD-10-CM | POA: Diagnosis not present

## 2019-04-26 ENCOUNTER — Other Ambulatory Visit: Payer: Self-pay

## 2019-04-26 DIAGNOSIS — Z20822 Contact with and (suspected) exposure to covid-19: Secondary | ICD-10-CM

## 2019-04-26 DIAGNOSIS — R6889 Other general symptoms and signs: Secondary | ICD-10-CM | POA: Diagnosis not present

## 2019-04-28 LAB — NOVEL CORONAVIRUS, NAA: SARS-CoV-2, NAA: NOT DETECTED

## 2019-04-29 ENCOUNTER — Ambulatory Visit: Payer: Medicare Other | Admitting: Neurology

## 2019-05-09 DIAGNOSIS — R05 Cough: Secondary | ICD-10-CM | POA: Diagnosis not present

## 2019-05-09 DIAGNOSIS — M317 Microscopic polyangiitis: Secondary | ICD-10-CM | POA: Diagnosis not present

## 2019-05-09 DIAGNOSIS — I776 Arteritis, unspecified: Secondary | ICD-10-CM | POA: Diagnosis not present

## 2019-05-09 DIAGNOSIS — J849 Interstitial pulmonary disease, unspecified: Secondary | ICD-10-CM | POA: Diagnosis not present

## 2019-05-12 DIAGNOSIS — Z23 Encounter for immunization: Secondary | ICD-10-CM | POA: Diagnosis not present

## 2019-05-19 DIAGNOSIS — Z79899 Other long term (current) drug therapy: Secondary | ICD-10-CM | POA: Diagnosis not present

## 2019-05-19 DIAGNOSIS — M318 Other specified necrotizing vasculopathies: Secondary | ICD-10-CM | POA: Diagnosis not present

## 2019-05-19 DIAGNOSIS — J8489 Other specified interstitial pulmonary diseases: Secondary | ICD-10-CM | POA: Diagnosis not present

## 2019-05-19 DIAGNOSIS — I776 Arteritis, unspecified: Secondary | ICD-10-CM | POA: Diagnosis not present

## 2019-05-20 DIAGNOSIS — J849 Interstitial pulmonary disease, unspecified: Secondary | ICD-10-CM | POA: Diagnosis not present

## 2019-05-20 DIAGNOSIS — K224 Dyskinesia of esophagus: Secondary | ICD-10-CM | POA: Diagnosis not present

## 2019-05-23 ENCOUNTER — Other Ambulatory Visit: Payer: Self-pay | Admitting: Registered"

## 2019-05-23 DIAGNOSIS — Z20828 Contact with and (suspected) exposure to other viral communicable diseases: Secondary | ICD-10-CM | POA: Diagnosis not present

## 2019-05-23 DIAGNOSIS — Z20822 Contact with and (suspected) exposure to covid-19: Secondary | ICD-10-CM

## 2019-05-25 LAB — NOVEL CORONAVIRUS, NAA: SARS-CoV-2, NAA: NOT DETECTED

## 2019-05-27 DIAGNOSIS — R0600 Dyspnea, unspecified: Secondary | ICD-10-CM | POA: Diagnosis not present

## 2019-05-27 DIAGNOSIS — I776 Arteritis, unspecified: Secondary | ICD-10-CM | POA: Diagnosis not present

## 2019-05-27 DIAGNOSIS — Z79899 Other long term (current) drug therapy: Secondary | ICD-10-CM | POA: Diagnosis not present

## 2019-05-27 DIAGNOSIS — Z6833 Body mass index (BMI) 33.0-33.9, adult: Secondary | ICD-10-CM | POA: Diagnosis not present

## 2019-05-27 DIAGNOSIS — J849 Interstitial pulmonary disease, unspecified: Secondary | ICD-10-CM | POA: Diagnosis not present

## 2019-06-01 IMAGING — CT CT CHEST HIGH RESOLUTION W/O CM
2 of 7 series · 14 of 36 positions shown, 17 images · non-contrast
Comparison: 04/24/2017 chest CT.  06/05/2017 chest radiograph.

CLINICAL DATA: Follow-up interstitial lung disease.

EXAM:
CT CHEST WITHOUT CONTRAST
TECHNIQUE: Multidetector CT imaging of the chest was performed following the
standard protocol without intravenous contrast. High resolution
imaging of the lungs, as well as inspiratory and expiratory imaging,
was performed.

[Series 2: high resolution · axial · 0.75mm/px · z∈[-318,-48]mm · 11 of 155 slices shown, 14 images]
[im 10/155  mediastinal]
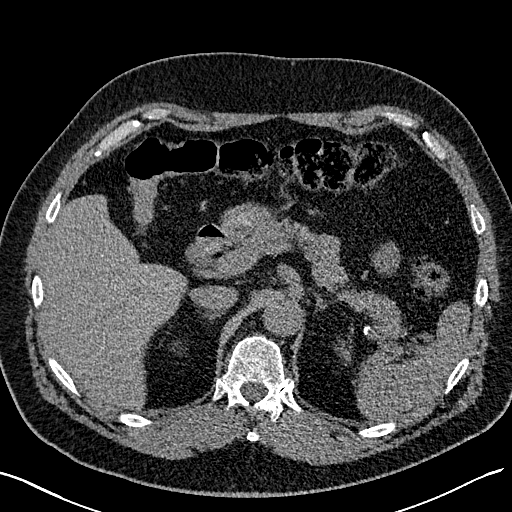
[im 10/155  lung]
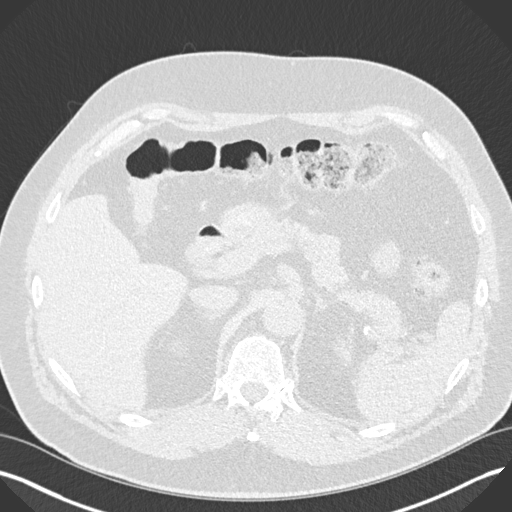
[im 28/155  lung]
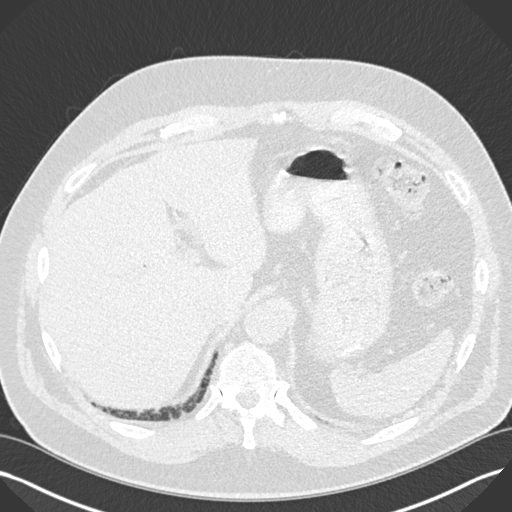
[im 37/155  lung]
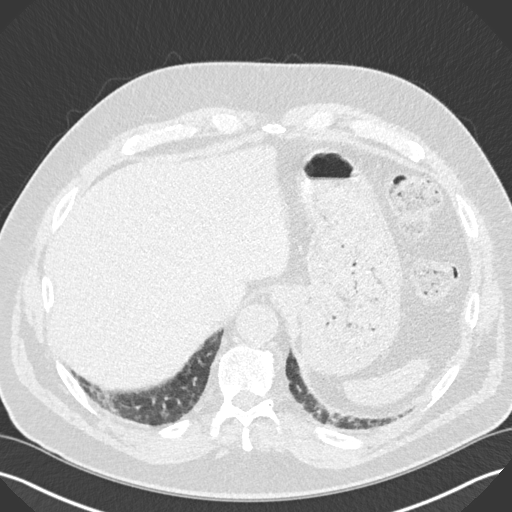
[im 55/155  lung]
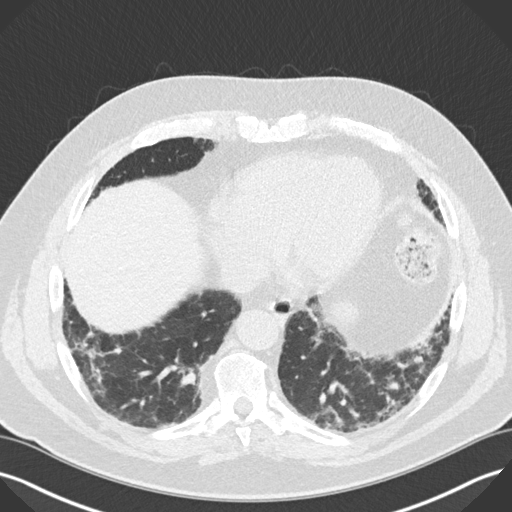
[im 64/155  mediastinal]
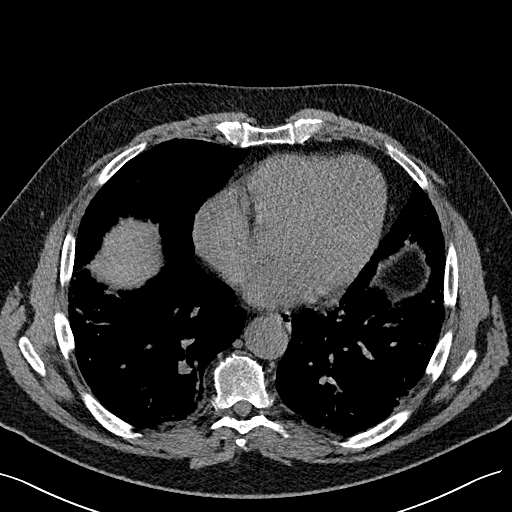
[im 64/155  lung]
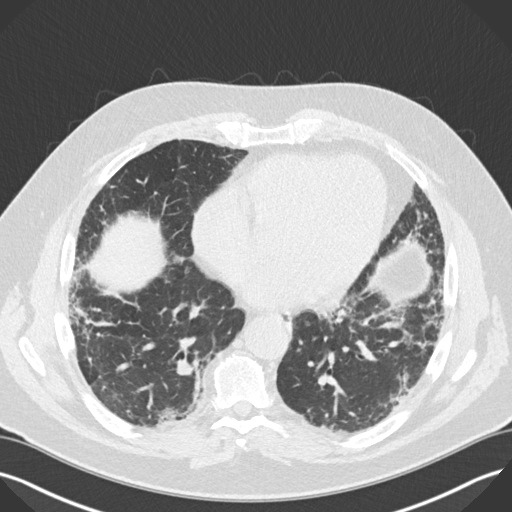
[im 82/155  lung]
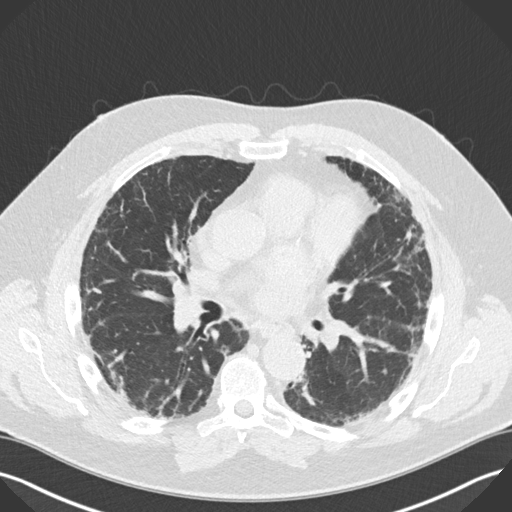
[im 91/155  lung]
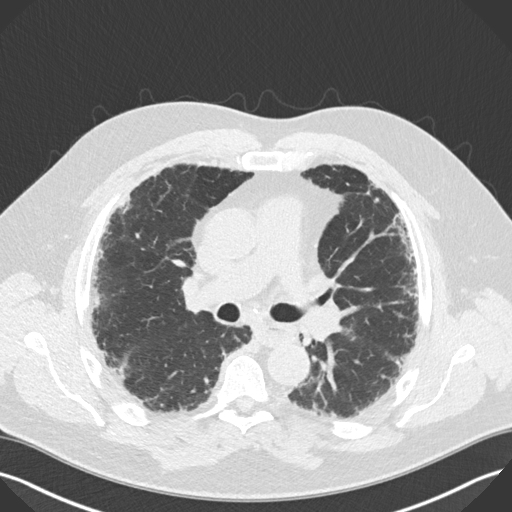
[im 100/155  lung]
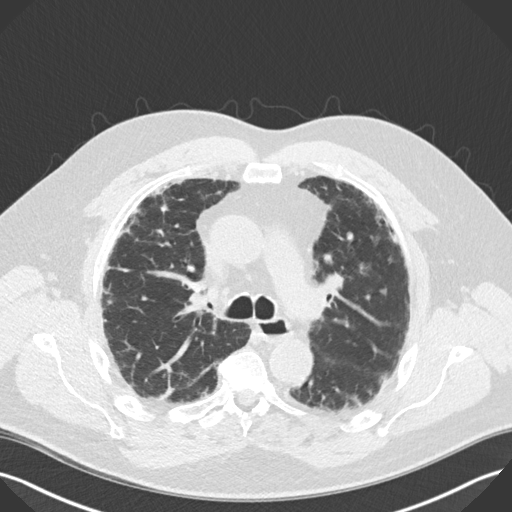
[im 118/155  mediastinal]
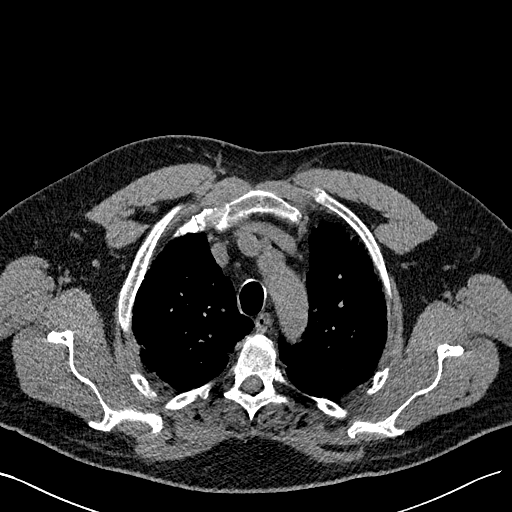
[im 118/155  lung]
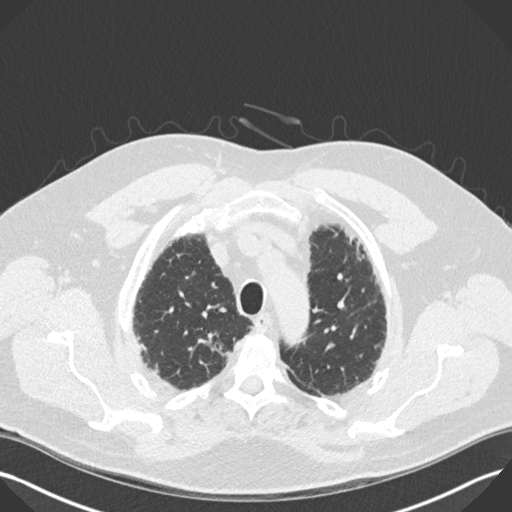
[im 127/155  lung]
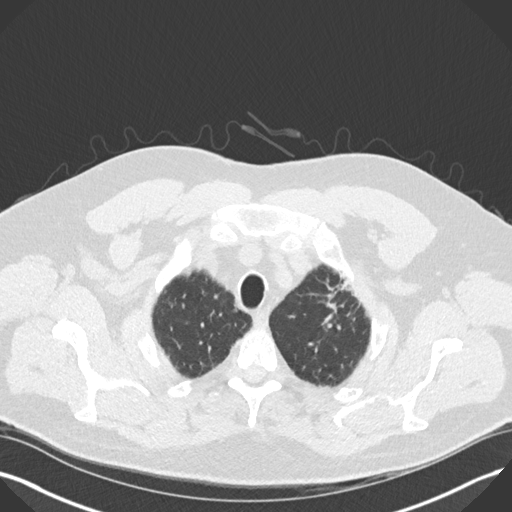
[im 145/155  lung]
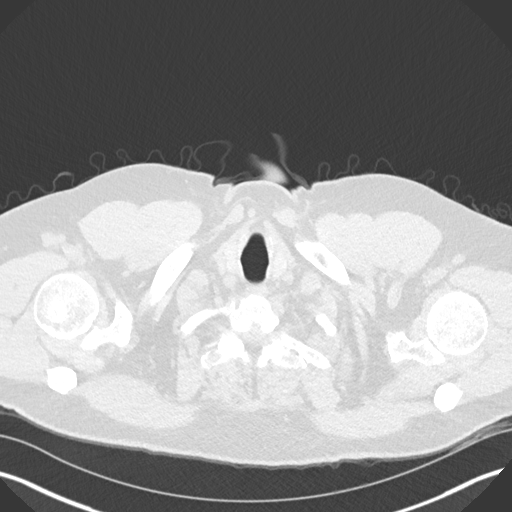

[Series 11: coronal · coronal · 0.63mm/px · 3 of 128 slices shown]
[im 26/128  lung]
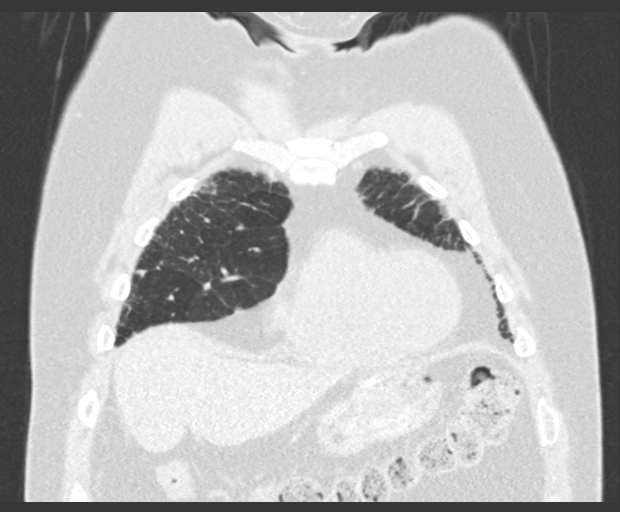
[im 51/128  lung]
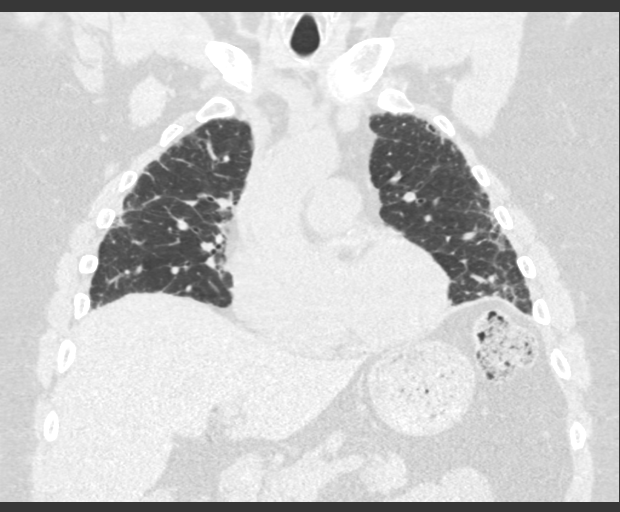
[im 77/128  lung]
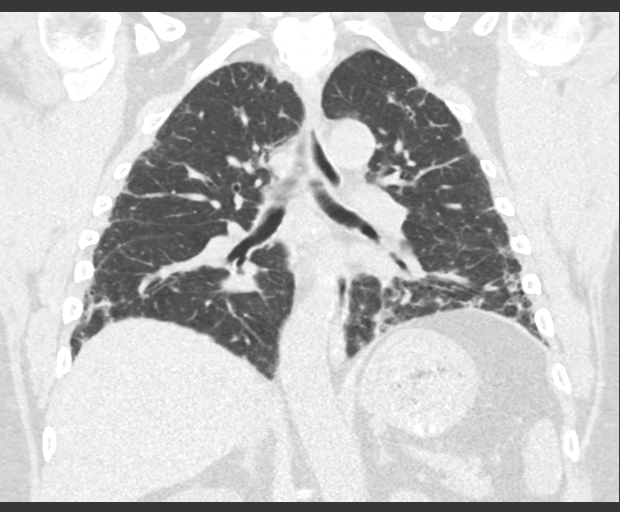

[14 of 36 positions shown; findings below may reference images not displayed]

FINDINGS: Cardiovascular: Normal heart size. No significant pericardial
fluid/thickening. Left anterior descending and right coronary
atherosclerosis. Mildly atherosclerotic nonaneurysmal thoracic
aorta. Stable top-normal caliber main pulmonary artery (3.0 cm
diameter).

Mediastinum/Nodes: No discrete thyroid nodules. Mildly patulous
thoracic esophagus with small fluid level in the midthoracic
esophagus. No pathologically enlarged axillary, mediastinal or gross
hilar lymph nodes, noting limited sensitivity for the detection of
hilar adenopathy on this noncontrast study.

Lungs/Pleura: No pneumothorax. No pleural effusion. No acute
consolidative airspace disease or lung masses. Subpleural right
middle lobe 8 mm solid pulmonary nodule (series 3/ image 83), stable
since 04/24/2017, probably benign. No new significant pulmonary
nodules. No significant air trapping on the expiration sequence.
There is patchy subpleural and perilobular reticulation and
ground-glass attenuation throughout both lungs, with associated mild
traction bronchiolectasis and architectural distortion. No clear
basilar gradient to these findings. Scattered tiny subpleural cystic
foci throughout both lungs without frank honeycombing at this time.
Compared to the 04/24/2017 chest CT study, the distribution of the
findings is unchanged, with mildly decreased density of the
previously visualized patchy perilobular consolidation throughout
both lungs leaving residual reticulation and ground-glass
attenuation. No evidence of progressive opacities.

Upper abdomen: Stable pneumobilia in the central left liver lobe and
visualized common bile duct, presumably due to a history of
sphincterotomy.

Musculoskeletal: No aggressive appearing focal osseous lesions. Mild
thoracic spondylosis.
IMPRESSION: 1. Spectrum of findings most compatible with continued evolution of
suspected postinflammatory fibrosis in a fibrotic phase nonspecific
interstitial pneumonia (NSIP) pattern. Findings are technically
indeterminate for usual interstitial pneumonia (UIP), although this
is not strongly favored given the absence of a clear basilar
gradient, the absence of frank honeycombing and the previously noted
perilobular consolidation. Recommend a follow-up high-resolution
chest CT in 12 months to assess ongoing temporal pattern stability.
2. Stable subpleural 8 mm right middle lobe pulmonary nodule, for
which 3 month stability has been demonstrated, probably benign. This
nodule can be reassessed on follow-up chest CT in 12 months.
3. Two vessel coronary atherosclerosis.

Aortic Atherosclerosis ([id number].L).

## 2019-06-03 DIAGNOSIS — M318 Other specified necrotizing vasculopathies: Secondary | ICD-10-CM | POA: Diagnosis not present

## 2019-06-03 DIAGNOSIS — I776 Arteritis, unspecified: Secondary | ICD-10-CM | POA: Diagnosis not present

## 2019-06-03 DIAGNOSIS — J8489 Other specified interstitial pulmonary diseases: Secondary | ICD-10-CM | POA: Diagnosis not present

## 2019-06-10 ENCOUNTER — Other Ambulatory Visit: Payer: Self-pay

## 2019-06-10 ENCOUNTER — Ambulatory Visit (INDEPENDENT_AMBULATORY_CARE_PROVIDER_SITE_OTHER): Payer: Medicare Other | Admitting: Neurology

## 2019-06-10 ENCOUNTER — Encounter: Payer: Self-pay | Admitting: Neurology

## 2019-06-10 VITALS — BP 135/75 | HR 70 | Temp 97.5°F | Ht 71.0 in | Wt 234.0 lb

## 2019-06-10 DIAGNOSIS — M317 Microscopic polyangiitis: Secondary | ICD-10-CM

## 2019-06-10 DIAGNOSIS — K224 Dyskinesia of esophagus: Secondary | ICD-10-CM | POA: Diagnosis not present

## 2019-06-10 DIAGNOSIS — J849 Interstitial pulmonary disease, unspecified: Secondary | ICD-10-CM | POA: Diagnosis not present

## 2019-06-10 DIAGNOSIS — K219 Gastro-esophageal reflux disease without esophagitis: Secondary | ICD-10-CM | POA: Diagnosis not present

## 2019-06-10 DIAGNOSIS — R05 Cough: Secondary | ICD-10-CM | POA: Diagnosis not present

## 2019-06-10 DIAGNOSIS — R1310 Dysphagia, unspecified: Secondary | ICD-10-CM | POA: Diagnosis not present

## 2019-06-10 DIAGNOSIS — G619 Inflammatory polyneuropathy, unspecified: Secondary | ICD-10-CM

## 2019-06-10 DIAGNOSIS — K222 Esophageal obstruction: Secondary | ICD-10-CM | POA: Diagnosis not present

## 2019-06-10 NOTE — Progress Notes (Signed)
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  The patient was accompanied to the clinic by self.  History of present illness: During the summer 2018, it developed fatigue, shortness of breath, myalgias, and polyarthralgias.  He was treated for community-acquired pneumonia and [facility] spotted fever, but this diagnosis was later retracted by infectious disease.  By the fall 2018, he noticed tingling and numbness of the feet, rarely in the fingertips.  He was subsequently diagnosed with microscopic polyangiitis by Dr. Amil Amen after serology testing was positive for p-ANCA and MPO antibody and CT chest showed groundglass attenuation.  He was started on rituximab and tapering dose of prednisone. He also is followed by [facility] pulmonology, Dr. Loni Muse, who started him on CellCept 1500 mg twice daily.  UPDATE 06/10/2019:  He is here for 6 month follow-up visit. He has not noticed any progression of neuropathy and continues to remain in the feet up to the level of ankles.  Occasionally, he gets leg cramps at nighttime.  His balance is fair, he walks unassisted and has not suffered any falls.  Because of shortness of breath, his prednisone was increased to 10mg  daily.  He remains on CellCept 1500 mg twice daily and rituximab every 6 months.  No new neurological complaints.   Medications:  Current Outpatient Medications on File Prior to Visit  Medication Sig Dispense Refill  . calcium citrate-vitamin D (CITRACAL+D) 315-200 MG-UNIT tablet Take 1 tablet by mouth 2 (two) times daily.    . Cholecalciferol (VITAMIN D3) 25 MCG (1000 UT) CAPS Take by mouth.    . fluticasone furoate-vilanterol (BREO ELLIPTA) 200-25 MCG/INH AEPB INHALE ONE  PUFF DAILY    . gabapentin (NEURONTIN) 300 MG capsule Take 1 capsule (300 mg total) by mouth 2 (two) times daily. 180 capsule 3  . mycophenolate (CELLCEPT) 500 MG tablet Take 1,500 mg by mouth 2 (two) times daily.     . NON FORMULARY Take by mouth daily. Bee proplis OTC    . pantoprazole (PROTONIX) 40 MG tablet Take 30- 60 min before your first and last meals of the day (Patient taking differently: Take 40 mg by mouth 2 (two) times daily. Take 30- 60 min before your first and last meals of the day) 60 tablet 2  . predniSONE (DELTASONE) 10 MG tablet Take 4 tablets (40 mg total) by mouth daily with breakfast. (Patient taking differently: Take 5 mg by mouth daily with breakfast. 2.5 tablets.) 120 tablet 1  . Turmeric 500 MG TABS Take 1,000 mg by mouth at bedtime.      No current facility-administered medications on file prior to visit.     Allergies: No Known Allergies  Review of Systems:  CONSTITUTIONAL: No fevers, chills, night sweats, or weight loss.  EYES: No visual changes or eye pain ENT: No hearing changes.  No history of nose bleeds.   RESPIRATORY: No cough, wheezing and shortness of breath.   CARDIOVASCULAR: Negative for chest pain, and palpitations.   GI: Negative for abdominal discomfort, blood in stools or black stools.  No recent change in bowel habits.   GU:  No history of incontinence.   MUSCLOSKELETAL: No history of joint pain or swelling.  No myalgias.   SKIN: Negative for lesions, rash, and itching.  ENDOCRINE: Negative for cold or heat intolerance, polydipsia or goiter.   PSYCH:  No depression or anxiety symptoms.   NEURO: As Above.   Vital Signs:  BP 135/75   Pulse 70   Temp (!) 97.5 F (36.4 C)   Ht 5\' 11"  (1.803 m)   Wt 234 lb (106.1 kg)   SpO2 94%   BMI 32.64 kg/m    General Medical Exam:   General:  Well appearing, comfortable  Eyes/ENT: see cranial nerve examination.   Neck:   No carotid bruits. Respiratory:  Shallow breaths, mild rhonchi at the bases  bilaterally.   Cardiac:  Regular rate and rhythm, no murmur.   Ext:  No edema  Neurological Exam: MENTAL STATUS including orientation to time, place, person, recent and remote memory, attention span and concentration, language, and fund of knowledge is normal.  Speech is not dysarthric.  CRANIAL NERVES:  Face is symmetric  MOTOR:  Mild bilateral gastrocnemius atrophy, no fasciculations or abnormal movements.  No pronator drift.  Tone is normal.    Right Upper Extremity:    Left Upper Extremity:    Deltoid  5/5   Deltoid  5/5   Biceps  5/5   Biceps  5/5   Triceps  5/5   Triceps  5/5   Wrist extensors  5/5   Wrist extensors  5/5   Wrist flexors  5/5   Wrist flexors  5/5   Finger extensors  5/5   Finger extensors  5/5   Finger flexors  5/5   Finger flexors  5/5   Dorsal interossei  5/5   Dorsal interossei  5/5   Abductor pollicis  5/5   Abductor pollicis  5/5   Tone (Ashworth scale)  0  Tone (Ashworth scale)  0   Right Lower Extremity:    Left Lower Extremity:    Hip flexors  5/5   Hip flexors  5/5   Hip extensors  5/5   Hip extensors  5/5   Knee flexors  5/5   Knee flexors  5/5   Knee extensors  5/5   Knee extensors  5/5   Dorsiflexors  5/5   Dorsiflexors  5/5   Plantarflexors  5/5   Plantarflexors  5/5   Toe extensors  5/5   Toe extensors  5/5   Toe flexors  5/5   Toe flexors  5/5   Tone (Ashworth scale)  0  Tone (Ashworth scale)  0    MSRs:  Reflexes 2+/4 are throughout, and trace at the Achilles (improved).  SENSORY:  Vibration is 100% at the knees and ankles and 30% at the great toe.  Temperature and pin prick is intact in the feet.  Rhomberg testing is negative.  COORDINATION/GAIT:  Intact rapid alternating movements bilaterally.  Gait is narrow based, tandem and stressed gait intact.   Data: Labs 05/21/2017:  p-ANCA 1:320*, MPO antibody 25.7*, aldolase 4.5, dsDNA neg, ANA neg, C3/4 neg,   CT chest 08/21/2017: 1. Spectrum of findings most compatible with continued  evolution of suspected postinflammatory fibrosis in a fibrotic phase nonspecific interstitial pneumonia (NSIP) pattern. Findings are technically indeterminate for usual interstitial pneumonia (UIP), although this is not strongly favored given the absence of a clear basilar gradient, the absence of frank honeycombing and the previously noted perilobular consolidation. Recommend a follow-up high-resolution chest CT in 12 months to assess ongoing temporal pattern stability.  2. Stable subpleural 8 mm right middle lobe pulmonary nodule, for which 3 month stability  has been demonstrated, probably benign. This nodule can be reassessed on follow-up chest CT in 12 months. 3. Two vessel coronary atherosclerosis.  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  There are no signs of neuropathy or entrapment neuropathy in the left upper extremity.   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  Clinically, his neuropathy has improved in the legs, with return of distal strength, sensation, and reflexes since onset.  His exam today remains stable as compared to his prior visit.  Specifically, there has been no progression of his neuropathy.  He is on immunotherapy with rituximab every 6 months, CellCept 1500 mg twice a day, and tapering dose of prednisone at 10mg  daily, which is managed by rheumatology and pulmonology.  I will keep him on gabapentin 300 mg twice daily, which adequately controls painful paresthesias.  Patient educated on daily foot inspection, fall prevention, and safety precautions around the home.  Return to clinic in 6 months   Thank you for allowing me to participate in patient's care.  If I can answer any  additional questions, I would be pleased to do so.    Sincerely,    Mattheu Brodersen K. Posey Pronto, DO

## 2019-06-10 NOTE — Patient Instructions (Signed)
Return to clinic in 6 months.

## 2019-06-16 DIAGNOSIS — Z20828 Contact with and (suspected) exposure to other viral communicable diseases: Secondary | ICD-10-CM | POA: Diagnosis not present

## 2019-06-16 DIAGNOSIS — B349 Viral infection, unspecified: Secondary | ICD-10-CM | POA: Diagnosis not present

## 2019-06-23 DIAGNOSIS — Z961 Presence of intraocular lens: Secondary | ICD-10-CM | POA: Diagnosis not present

## 2019-07-08 DIAGNOSIS — J8410 Pulmonary fibrosis, unspecified: Secondary | ICD-10-CM | POA: Diagnosis not present

## 2019-07-08 DIAGNOSIS — M317 Microscopic polyangiitis: Secondary | ICD-10-CM | POA: Diagnosis not present

## 2019-07-08 DIAGNOSIS — E785 Hyperlipidemia, unspecified: Secondary | ICD-10-CM | POA: Diagnosis not present

## 2019-07-08 DIAGNOSIS — Z1389 Encounter for screening for other disorder: Secondary | ICD-10-CM | POA: Diagnosis not present

## 2019-07-08 DIAGNOSIS — G629 Polyneuropathy, unspecified: Secondary | ICD-10-CM | POA: Diagnosis not present

## 2019-07-08 DIAGNOSIS — D692 Other nonthrombocytopenic purpura: Secondary | ICD-10-CM | POA: Diagnosis not present

## 2019-07-08 DIAGNOSIS — Z0000 Encounter for general adult medical examination without abnormal findings: Secondary | ICD-10-CM | POA: Diagnosis not present

## 2019-07-18 DIAGNOSIS — Z20828 Contact with and (suspected) exposure to other viral communicable diseases: Secondary | ICD-10-CM | POA: Diagnosis not present

## 2019-07-18 DIAGNOSIS — Z01812 Encounter for preprocedural laboratory examination: Secondary | ICD-10-CM | POA: Diagnosis not present

## 2019-07-20 DIAGNOSIS — K259 Gastric ulcer, unspecified as acute or chronic, without hemorrhage or perforation: Secondary | ICD-10-CM | POA: Diagnosis not present

## 2019-07-20 DIAGNOSIS — K449 Diaphragmatic hernia without obstruction or gangrene: Secondary | ICD-10-CM | POA: Diagnosis not present

## 2019-07-20 DIAGNOSIS — Z6831 Body mass index (BMI) 31.0-31.9, adult: Secondary | ICD-10-CM | POA: Diagnosis not present

## 2019-07-20 DIAGNOSIS — K317 Polyp of stomach and duodenum: Secondary | ICD-10-CM | POA: Diagnosis not present

## 2019-07-20 DIAGNOSIS — E669 Obesity, unspecified: Secondary | ICD-10-CM | POA: Diagnosis not present

## 2019-07-20 DIAGNOSIS — Z9049 Acquired absence of other specified parts of digestive tract: Secondary | ICD-10-CM | POA: Diagnosis not present

## 2019-07-20 DIAGNOSIS — K219 Gastro-esophageal reflux disease without esophagitis: Secondary | ICD-10-CM | POA: Diagnosis not present

## 2019-07-20 DIAGNOSIS — K224 Dyskinesia of esophagus: Secondary | ICD-10-CM | POA: Diagnosis not present

## 2019-07-29 DIAGNOSIS — J849 Interstitial pulmonary disease, unspecified: Secondary | ICD-10-CM | POA: Diagnosis not present

## 2019-08-05 ENCOUNTER — Ambulatory Visit: Payer: Medicare Other | Attending: Internal Medicine

## 2019-08-05 DIAGNOSIS — Z20822 Contact with and (suspected) exposure to covid-19: Secondary | ICD-10-CM

## 2019-08-06 LAB — NOVEL CORONAVIRUS, NAA: SARS-CoV-2, NAA: NOT DETECTED

## 2019-08-26 DIAGNOSIS — K222 Esophageal obstruction: Secondary | ICD-10-CM | POA: Diagnosis not present

## 2019-08-26 DIAGNOSIS — K224 Dyskinesia of esophagus: Secondary | ICD-10-CM | POA: Diagnosis not present

## 2019-08-26 DIAGNOSIS — K219 Gastro-esophageal reflux disease without esophagitis: Secondary | ICD-10-CM | POA: Diagnosis not present

## 2019-08-26 DIAGNOSIS — R05 Cough: Secondary | ICD-10-CM | POA: Diagnosis not present

## 2019-09-07 ENCOUNTER — Ambulatory Visit: Payer: Medicare Other | Attending: Internal Medicine

## 2019-09-07 DIAGNOSIS — Z23 Encounter for immunization: Secondary | ICD-10-CM | POA: Insufficient documentation

## 2019-09-07 NOTE — Progress Notes (Signed)
   Covid-19 Vaccination Clinic  Name:  Alex Atkinson    MRN: [id number] DOB: September 25, 1950  09/07/2019  Alex Atkinson was observed post Covid-19 immunization for 15 minutes without incidence. He was provided with Vaccine Information Sheet and instruction to access the V-Safe system.   Alex Atkinson was instructed to call 911 with any severe reactions post vaccine: Marland Kitchen Difficulty breathing  . Swelling of your face and throat  . A fast heartbeat  . A bad rash all over your body  . Dizziness and weakness    Immunizations Administered    Name Date Dose VIS Date Route   Pfizer COVID-19 Vaccine 09/07/2019  5:07 PM 0.3 mL 07/29/2019 Intramuscular   Manufacturer: [place]   Lot: [id number]   [place]: [id number]

## 2019-09-09 DIAGNOSIS — R05 Cough: Secondary | ICD-10-CM | POA: Diagnosis not present

## 2019-09-09 DIAGNOSIS — E669 Obesity, unspecified: Secondary | ICD-10-CM | POA: Diagnosis not present

## 2019-09-09 DIAGNOSIS — K219 Gastro-esophageal reflux disease without esophagitis: Secondary | ICD-10-CM | POA: Diagnosis not present

## 2019-09-09 DIAGNOSIS — J8410 Pulmonary fibrosis, unspecified: Secondary | ICD-10-CM | POA: Diagnosis not present

## 2019-09-09 DIAGNOSIS — R633 Feeding difficulties: Secondary | ICD-10-CM | POA: Diagnosis not present

## 2019-09-09 DIAGNOSIS — R519 Headache, unspecified: Secondary | ICD-10-CM | POA: Diagnosis not present

## 2019-09-09 DIAGNOSIS — G629 Polyneuropathy, unspecified: Secondary | ICD-10-CM | POA: Diagnosis not present

## 2019-09-09 DIAGNOSIS — Z9225 Personal history of immunosupression therapy: Secondary | ICD-10-CM | POA: Diagnosis not present

## 2019-09-09 DIAGNOSIS — J321 Chronic frontal sinusitis: Secondary | ICD-10-CM | POA: Diagnosis not present

## 2019-09-09 DIAGNOSIS — E785 Hyperlipidemia, unspecified: Secondary | ICD-10-CM | POA: Diagnosis not present

## 2019-09-09 DIAGNOSIS — Z7952 Long term (current) use of systemic steroids: Secondary | ICD-10-CM | POA: Diagnosis not present

## 2019-09-09 DIAGNOSIS — I289 Disease of pulmonary vessels, unspecified: Secondary | ICD-10-CM | POA: Diagnosis not present

## 2019-09-09 DIAGNOSIS — R918 Other nonspecific abnormal finding of lung field: Secondary | ICD-10-CM | POA: Diagnosis not present

## 2019-09-09 DIAGNOSIS — R1310 Dysphagia, unspecified: Secondary | ICD-10-CM | POA: Diagnosis not present

## 2019-09-09 DIAGNOSIS — Z6832 Body mass index (BMI) 32.0-32.9, adult: Secondary | ICD-10-CM | POA: Diagnosis not present

## 2019-09-09 DIAGNOSIS — Z79899 Other long term (current) drug therapy: Secondary | ICD-10-CM | POA: Diagnosis not present

## 2019-09-09 DIAGNOSIS — R1319 Other dysphagia: Secondary | ICD-10-CM | POA: Diagnosis not present

## 2019-09-09 DIAGNOSIS — R911 Solitary pulmonary nodule: Secondary | ICD-10-CM | POA: Diagnosis not present

## 2019-09-09 DIAGNOSIS — K5790 Diverticulosis of intestine, part unspecified, without perforation or abscess without bleeding: Secondary | ICD-10-CM | POA: Diagnosis not present

## 2019-09-09 DIAGNOSIS — I776 Arteritis, unspecified: Secondary | ICD-10-CM | POA: Diagnosis not present

## 2019-09-09 DIAGNOSIS — J329 Chronic sinusitis, unspecified: Secondary | ICD-10-CM | POA: Diagnosis not present

## 2019-09-09 DIAGNOSIS — R0600 Dyspnea, unspecified: Secondary | ICD-10-CM | POA: Diagnosis not present

## 2019-09-09 DIAGNOSIS — J320 Chronic maxillary sinusitis: Secondary | ICD-10-CM | POA: Diagnosis not present

## 2019-09-09 DIAGNOSIS — Z801 Family history of malignant neoplasm of trachea, bronchus and lung: Secondary | ICD-10-CM | POA: Diagnosis not present

## 2019-09-09 DIAGNOSIS — J322 Chronic ethmoidal sinusitis: Secondary | ICD-10-CM | POA: Diagnosis not present

## 2019-09-09 DIAGNOSIS — J849 Interstitial pulmonary disease, unspecified: Secondary | ICD-10-CM | POA: Diagnosis not present

## 2019-09-17 ENCOUNTER — Ambulatory Visit: Payer: Medicare Other

## 2019-09-25 ENCOUNTER — Ambulatory Visit: Payer: Medicare Other

## 2019-09-28 ENCOUNTER — Ambulatory Visit: Payer: Medicare Other | Attending: Internal Medicine

## 2019-09-28 ENCOUNTER — Ambulatory Visit: Payer: Medicare Other

## 2019-09-28 DIAGNOSIS — Z23 Encounter for immunization: Secondary | ICD-10-CM | POA: Insufficient documentation

## 2019-09-28 NOTE — Progress Notes (Signed)
   Covid-19 Vaccination Clinic  Name:  Alex Atkinson    MRN: [id number] DOB: 02/23/1951  09/28/2019  [name] was observed post Covid-19 immunization for 15 minutes without incidence. He was provided with Vaccine Information Sheet and instruction to access the V-Safe system.   [name] was instructed to call 911 with any severe reactions post vaccine: Marland Kitchen Difficulty breathing  . Swelling of your face and throat  . A fast heartbeat  . A bad rash all over your body  . Dizziness and weakness    Immunizations Administered    Name Date Dose VIS Date Route   Pfizer COVID-19 Vaccine 09/28/2019  9:39 AM 0.3 mL 07/29/2019 Intramuscular   Manufacturer: [place]   Lot: [id number]   [place]: [id number]

## 2019-10-17 DIAGNOSIS — Z6710 Type A blood, Rh positive: Secondary | ICD-10-CM | POA: Diagnosis not present

## 2019-10-17 DIAGNOSIS — I723 Aneurysm of iliac artery: Secondary | ICD-10-CM | POA: Diagnosis not present

## 2019-10-17 DIAGNOSIS — J9690 Respiratory failure, unspecified, unspecified whether with hypoxia or hypercapnia: Secondary | ICD-10-CM | POA: Diagnosis not present

## 2019-10-17 DIAGNOSIS — J84112 Idiopathic pulmonary fibrosis: Secondary | ICD-10-CM | POA: Diagnosis not present

## 2019-10-17 DIAGNOSIS — J849 Interstitial pulmonary disease, unspecified: Secondary | ICD-10-CM | POA: Diagnosis not present

## 2019-10-17 DIAGNOSIS — Z113 Encounter for screening for infections with a predominantly sexual mode of transmission: Secondary | ICD-10-CM | POA: Diagnosis not present

## 2019-10-17 DIAGNOSIS — R918 Other nonspecific abnormal finding of lung field: Secondary | ICD-10-CM | POA: Diagnosis not present

## 2019-10-17 DIAGNOSIS — Z125 Encounter for screening for malignant neoplasm of prostate: Secondary | ICD-10-CM | POA: Diagnosis not present

## 2019-10-17 DIAGNOSIS — Z79899 Other long term (current) drug therapy: Secondary | ICD-10-CM | POA: Diagnosis not present

## 2019-10-17 DIAGNOSIS — I714 Abdominal aortic aneurysm, without rupture: Secondary | ICD-10-CM | POA: Diagnosis not present

## 2019-10-17 DIAGNOSIS — Z7682 Awaiting organ transplant status: Secondary | ICD-10-CM | POA: Diagnosis not present

## 2019-10-17 DIAGNOSIS — Z5181 Encounter for therapeutic drug level monitoring: Secondary | ICD-10-CM | POA: Diagnosis not present

## 2019-10-17 DIAGNOSIS — Z20822 Contact with and (suspected) exposure to covid-19: Secondary | ICD-10-CM | POA: Diagnosis not present

## 2019-10-31 ENCOUNTER — Other Ambulatory Visit: Payer: Self-pay | Admitting: Neurology

## 2019-11-21 DIAGNOSIS — I776 Arteritis, unspecified: Secondary | ICD-10-CM | POA: Diagnosis not present

## 2019-12-09 ENCOUNTER — Ambulatory Visit: Payer: Medicare Other | Admitting: Neurology

## 2019-12-13 DIAGNOSIS — R1312 Dysphagia, oropharyngeal phase: Secondary | ICD-10-CM | POA: Diagnosis not present

## 2019-12-13 DIAGNOSIS — R262 Difficulty in walking, not elsewhere classified: Secondary | ICD-10-CM | POA: Diagnosis not present

## 2019-12-14 DIAGNOSIS — R1312 Dysphagia, oropharyngeal phase: Secondary | ICD-10-CM | POA: Diagnosis not present

## 2019-12-14 DIAGNOSIS — R262 Difficulty in walking, not elsewhere classified: Secondary | ICD-10-CM | POA: Diagnosis not present

## 2020-01-05 DIAGNOSIS — Z20822 Contact with and (suspected) exposure to covid-19: Secondary | ICD-10-CM | POA: Diagnosis present

## 2020-01-05 DIAGNOSIS — J9809 Other diseases of bronchus, not elsewhere classified: Secondary | ICD-10-CM | POA: Diagnosis not present

## 2020-01-05 DIAGNOSIS — T68XXXA Hypothermia, initial encounter: Secondary | ICD-10-CM | POA: Diagnosis not present

## 2020-01-05 DIAGNOSIS — K3189 Other diseases of stomach and duodenum: Secondary | ICD-10-CM | POA: Diagnosis not present

## 2020-01-05 DIAGNOSIS — J9602 Acute respiratory failure with hypercapnia: Secondary | ICD-10-CM | POA: Diagnosis not present

## 2020-01-05 DIAGNOSIS — B259 Cytomegaloviral disease, unspecified: Secondary | ICD-10-CM | POA: Diagnosis not present

## 2020-01-05 DIAGNOSIS — K626 Ulcer of anus and rectum: Secondary | ICD-10-CM | POA: Diagnosis not present

## 2020-01-05 DIAGNOSIS — R570 Cardiogenic shock: Secondary | ICD-10-CM | POA: Diagnosis not present

## 2020-01-05 DIAGNOSIS — N179 Acute kidney failure, unspecified: Secondary | ICD-10-CM | POA: Diagnosis not present

## 2020-01-05 DIAGNOSIS — Z930 Tracheostomy status: Secondary | ICD-10-CM | POA: Diagnosis not present

## 2020-01-05 DIAGNOSIS — M7989 Other specified soft tissue disorders: Secondary | ICD-10-CM | POA: Diagnosis not present

## 2020-01-05 DIAGNOSIS — K6819 Other retroperitoneal abscess: Secondary | ICD-10-CM | POA: Diagnosis not present

## 2020-01-05 DIAGNOSIS — R1310 Dysphagia, unspecified: Secondary | ICD-10-CM | POA: Diagnosis not present

## 2020-01-05 DIAGNOSIS — I471 Supraventricular tachycardia: Secondary | ICD-10-CM | POA: Diagnosis not present

## 2020-01-05 DIAGNOSIS — J9621 Acute and chronic respiratory failure with hypoxia: Secondary | ICD-10-CM | POA: Diagnosis not present

## 2020-01-05 DIAGNOSIS — J9601 Acute respiratory failure with hypoxia: Secondary | ICD-10-CM | POA: Diagnosis not present

## 2020-01-05 DIAGNOSIS — A419 Sepsis, unspecified organism: Secondary | ICD-10-CM | POA: Diagnosis not present

## 2020-01-05 DIAGNOSIS — J939 Pneumothorax, unspecified: Secondary | ICD-10-CM | POA: Diagnosis not present

## 2020-01-05 DIAGNOSIS — D696 Thrombocytopenia, unspecified: Secondary | ICD-10-CM | POA: Diagnosis not present

## 2020-01-05 DIAGNOSIS — D72825 Bandemia: Secondary | ICD-10-CM | POA: Diagnosis not present

## 2020-01-05 DIAGNOSIS — J95821 Acute postprocedural respiratory failure: Secondary | ICD-10-CM | POA: Diagnosis not present

## 2020-01-05 DIAGNOSIS — J8410 Pulmonary fibrosis, unspecified: Secondary | ICD-10-CM | POA: Diagnosis not present

## 2020-01-05 DIAGNOSIS — I5021 Acute systolic (congestive) heart failure: Secondary | ICD-10-CM | POA: Diagnosis not present

## 2020-01-05 DIAGNOSIS — Z005 Encounter for examination of potential donor of organ and tissue: Secondary | ICD-10-CM | POA: Diagnosis not present

## 2020-01-05 DIAGNOSIS — D72829 Elevated white blood cell count, unspecified: Secondary | ICD-10-CM | POA: Diagnosis not present

## 2020-01-05 DIAGNOSIS — I4891 Unspecified atrial fibrillation: Secondary | ICD-10-CM | POA: Diagnosis not present

## 2020-01-05 DIAGNOSIS — I517 Cardiomegaly: Secondary | ICD-10-CM | POA: Diagnosis not present

## 2020-01-05 DIAGNOSIS — R845 Abnormal microbiological findings in specimens from respiratory organs and thorax: Secondary | ICD-10-CM | POA: Diagnosis not present

## 2020-01-05 DIAGNOSIS — E1165 Type 2 diabetes mellitus with hyperglycemia: Secondary | ICD-10-CM | POA: Diagnosis present

## 2020-01-05 DIAGNOSIS — B961 Klebsiella pneumoniae [K. pneumoniae] as the cause of diseases classified elsewhere: Secondary | ICD-10-CM | POA: Diagnosis not present

## 2020-01-05 DIAGNOSIS — F064 Anxiety disorder due to known physiological condition: Secondary | ICD-10-CM | POA: Diagnosis not present

## 2020-01-05 DIAGNOSIS — T86819 Unspecified complication of lung transplant: Secondary | ICD-10-CM | POA: Diagnosis not present

## 2020-01-05 DIAGNOSIS — M7981 Nontraumatic hematoma of soft tissue: Secondary | ICD-10-CM | POA: Diagnosis not present

## 2020-01-05 DIAGNOSIS — D801 Nonfamilial hypogammaglobulinemia: Secondary | ICD-10-CM | POA: Diagnosis not present

## 2020-01-05 DIAGNOSIS — D849 Immunodeficiency, unspecified: Secondary | ICD-10-CM | POA: Diagnosis not present

## 2020-01-05 DIAGNOSIS — R197 Diarrhea, unspecified: Secondary | ICD-10-CM | POA: Diagnosis not present

## 2020-01-05 DIAGNOSIS — G4700 Insomnia, unspecified: Secondary | ICD-10-CM | POA: Diagnosis not present

## 2020-01-05 DIAGNOSIS — B348 Other viral infections of unspecified site: Secondary | ICD-10-CM | POA: Diagnosis not present

## 2020-01-05 DIAGNOSIS — Z9889 Other specified postprocedural states: Secondary | ICD-10-CM | POA: Diagnosis not present

## 2020-01-05 DIAGNOSIS — F419 Anxiety disorder, unspecified: Secondary | ICD-10-CM | POA: Diagnosis not present

## 2020-01-05 DIAGNOSIS — G8918 Other acute postprocedural pain: Secondary | ICD-10-CM | POA: Diagnosis not present

## 2020-01-05 DIAGNOSIS — R443 Hallucinations, unspecified: Secondary | ICD-10-CM | POA: Diagnosis not present

## 2020-01-05 DIAGNOSIS — J984 Other disorders of lung: Secondary | ICD-10-CM | POA: Diagnosis not present

## 2020-01-05 DIAGNOSIS — J90 Pleural effusion, not elsewhere classified: Secondary | ICD-10-CM | POA: Diagnosis not present

## 2020-01-05 DIAGNOSIS — J948 Other specified pleural conditions: Secondary | ICD-10-CM | POA: Diagnosis not present

## 2020-01-05 DIAGNOSIS — Z942 Lung transplant status: Secondary | ICD-10-CM | POA: Diagnosis not present

## 2020-01-05 DIAGNOSIS — K6389 Other specified diseases of intestine: Secondary | ICD-10-CM | POA: Diagnosis not present

## 2020-01-05 DIAGNOSIS — Z789 Other specified health status: Secondary | ICD-10-CM | POA: Diagnosis not present

## 2020-01-05 DIAGNOSIS — Z992 Dependence on renal dialysis: Secondary | ICD-10-CM | POA: Diagnosis not present

## 2020-01-05 DIAGNOSIS — I340 Nonrheumatic mitral (valve) insufficiency: Secondary | ICD-10-CM | POA: Diagnosis present

## 2020-01-05 DIAGNOSIS — K30 Functional dyspepsia: Secondary | ICD-10-CM | POA: Diagnosis not present

## 2020-01-05 DIAGNOSIS — Z978 Presence of other specified devices: Secondary | ICD-10-CM | POA: Diagnosis not present

## 2020-01-05 DIAGNOSIS — J982 Interstitial emphysema: Secondary | ICD-10-CM | POA: Diagnosis not present

## 2020-01-05 DIAGNOSIS — D62 Acute posthemorrhagic anemia: Secondary | ICD-10-CM | POA: Diagnosis not present

## 2020-01-05 DIAGNOSIS — J9811 Atelectasis: Secondary | ICD-10-CM | POA: Diagnosis not present

## 2020-01-05 DIAGNOSIS — I319 Disease of pericardium, unspecified: Secondary | ICD-10-CM | POA: Diagnosis not present

## 2020-01-05 DIAGNOSIS — K668 Other specified disorders of peritoneum: Secondary | ICD-10-CM | POA: Diagnosis not present

## 2020-01-05 DIAGNOSIS — B449 Aspergillosis, unspecified: Secondary | ICD-10-CM | POA: Diagnosis not present

## 2020-01-05 DIAGNOSIS — J189 Pneumonia, unspecified organism: Secondary | ICD-10-CM | POA: Diagnosis not present

## 2020-01-05 DIAGNOSIS — E7220 Disorder of urea cycle metabolism, unspecified: Secondary | ICD-10-CM | POA: Diagnosis not present

## 2020-01-05 DIAGNOSIS — K567 Ileus, unspecified: Secondary | ICD-10-CM | POA: Diagnosis not present

## 2020-01-05 DIAGNOSIS — J690 Pneumonitis due to inhalation of food and vomit: Secondary | ICD-10-CM | POA: Diagnosis not present

## 2020-01-05 DIAGNOSIS — K429 Umbilical hernia without obstruction or gangrene: Secondary | ICD-10-CM | POA: Diagnosis not present

## 2020-01-05 DIAGNOSIS — R5381 Other malaise: Secondary | ICD-10-CM | POA: Diagnosis not present

## 2020-01-05 DIAGNOSIS — T8182XA Emphysema (subcutaneous) resulting from a procedure, initial encounter: Secondary | ICD-10-CM | POA: Diagnosis not present

## 2020-01-05 DIAGNOSIS — R410 Disorientation, unspecified: Secondary | ICD-10-CM | POA: Diagnosis not present

## 2020-01-05 DIAGNOSIS — I480 Paroxysmal atrial fibrillation: Secondary | ICD-10-CM | POA: Diagnosis not present

## 2020-01-05 DIAGNOSIS — T797XXA Traumatic subcutaneous emphysema, initial encounter: Secondary | ICD-10-CM | POA: Diagnosis not present

## 2020-01-05 DIAGNOSIS — J8489 Other specified interstitial pulmonary diseases: Secondary | ICD-10-CM | POA: Diagnosis present

## 2020-01-05 DIAGNOSIS — R1314 Dysphagia, pharyngoesophageal phase: Secondary | ICD-10-CM | POA: Diagnosis not present

## 2020-01-05 DIAGNOSIS — Z119 Encounter for screening for infectious and parasitic diseases, unspecified: Secondary | ICD-10-CM | POA: Diagnosis not present

## 2020-01-05 DIAGNOSIS — Z452 Encounter for adjustment and management of vascular access device: Secondary | ICD-10-CM | POA: Diagnosis not present

## 2020-01-05 DIAGNOSIS — Z9911 Dependence on respirator [ventilator] status: Secondary | ICD-10-CM | POA: Diagnosis not present

## 2020-01-05 DIAGNOSIS — I2699 Other pulmonary embolism without acute cor pulmonale: Secondary | ICD-10-CM | POA: Diagnosis not present

## 2020-01-05 DIAGNOSIS — F0631 Mood disorder due to known physiological condition with depressive features: Secondary | ICD-10-CM | POA: Diagnosis not present

## 2020-01-05 DIAGNOSIS — J849 Interstitial pulmonary disease, unspecified: Secondary | ICD-10-CM | POA: Diagnosis not present

## 2020-01-05 DIAGNOSIS — E870 Hyperosmolality and hypernatremia: Secondary | ICD-10-CM | POA: Diagnosis not present

## 2020-01-05 DIAGNOSIS — E46 Unspecified protein-calorie malnutrition: Secondary | ICD-10-CM | POA: Diagnosis not present

## 2020-01-05 DIAGNOSIS — K219 Gastro-esophageal reflux disease without esophagitis: Secondary | ICD-10-CM | POA: Diagnosis not present

## 2020-01-05 DIAGNOSIS — J9690 Respiratory failure, unspecified, unspecified whether with hypoxia or hypercapnia: Secondary | ICD-10-CM | POA: Diagnosis not present

## 2020-01-05 DIAGNOSIS — R509 Fever, unspecified: Secondary | ICD-10-CM | POA: Diagnosis not present

## 2020-01-05 DIAGNOSIS — Z9189 Other specified personal risk factors, not elsewhere classified: Secondary | ICD-10-CM | POA: Diagnosis not present

## 2020-01-05 DIAGNOSIS — T86818 Other complications of lung transplant: Secondary | ICD-10-CM | POA: Diagnosis not present

## 2020-01-05 DIAGNOSIS — Z792 Long term (current) use of antibiotics: Secondary | ICD-10-CM | POA: Diagnosis not present

## 2020-01-05 DIAGNOSIS — M79602 Pain in left arm: Secondary | ICD-10-CM | POA: Diagnosis not present

## 2020-01-05 DIAGNOSIS — I2693 Single subsegmental pulmonary embolism without acute cor pulmonale: Secondary | ICD-10-CM | POA: Diagnosis not present

## 2020-01-05 DIAGNOSIS — T380X5A Adverse effect of glucocorticoids and synthetic analogues, initial encounter: Secondary | ICD-10-CM | POA: Diagnosis not present

## 2020-01-05 DIAGNOSIS — F05 Delirium due to known physiological condition: Secondary | ICD-10-CM | POA: Diagnosis not present

## 2020-01-05 DIAGNOSIS — J9612 Chronic respiratory failure with hypercapnia: Secondary | ICD-10-CM | POA: Diagnosis not present

## 2020-01-05 DIAGNOSIS — E872 Acidosis: Secondary | ICD-10-CM | POA: Diagnosis not present

## 2020-01-05 DIAGNOSIS — R600 Localized edema: Secondary | ICD-10-CM | POA: Diagnosis not present

## 2020-01-05 DIAGNOSIS — J811 Chronic pulmonary edema: Secondary | ICD-10-CM | POA: Diagnosis not present

## 2020-01-05 DIAGNOSIS — Z7901 Long term (current) use of anticoagulants: Secondary | ICD-10-CM | POA: Diagnosis not present

## 2020-01-05 DIAGNOSIS — G8912 Acute post-thoracotomy pain: Secondary | ICD-10-CM | POA: Diagnosis not present

## 2020-01-05 DIAGNOSIS — B3789 Other sites of candidiasis: Secondary | ICD-10-CM | POA: Diagnosis not present

## 2020-01-05 DIAGNOSIS — I10 Essential (primary) hypertension: Secondary | ICD-10-CM | POA: Diagnosis not present

## 2020-01-05 DIAGNOSIS — Z4659 Encounter for fitting and adjustment of other gastrointestinal appliance and device: Secondary | ICD-10-CM | POA: Diagnosis not present

## 2020-01-05 DIAGNOSIS — K5900 Constipation, unspecified: Secondary | ICD-10-CM | POA: Diagnosis not present

## 2020-01-05 DIAGNOSIS — Z5181 Encounter for therapeutic drug level monitoring: Secondary | ICD-10-CM | POA: Diagnosis not present

## 2020-01-05 DIAGNOSIS — R918 Other nonspecific abnormal finding of lung field: Secondary | ICD-10-CM | POA: Diagnosis not present

## 2020-01-05 DIAGNOSIS — R109 Unspecified abdominal pain: Secondary | ICD-10-CM | POA: Diagnosis not present

## 2020-01-05 DIAGNOSIS — T86812 Lung transplant infection: Secondary | ICD-10-CM | POA: Diagnosis not present

## 2020-01-05 DIAGNOSIS — D539 Nutritional anemia, unspecified: Secondary | ICD-10-CM | POA: Diagnosis present

## 2020-01-05 DIAGNOSIS — I4510 Unspecified right bundle-branch block: Secondary | ICD-10-CM | POA: Diagnosis not present

## 2020-01-05 DIAGNOSIS — R7881 Bacteremia: Secondary | ICD-10-CM | POA: Diagnosis not present

## 2020-01-05 DIAGNOSIS — E43 Unspecified severe protein-calorie malnutrition: Secondary | ICD-10-CM | POA: Diagnosis not present

## 2020-01-05 DIAGNOSIS — R739 Hyperglycemia, unspecified: Secondary | ICD-10-CM | POA: Diagnosis not present

## 2020-01-05 DIAGNOSIS — R1312 Dysphagia, oropharyngeal phase: Secondary | ICD-10-CM | POA: Diagnosis not present

## 2020-01-05 DIAGNOSIS — R491 Aphonia: Secondary | ICD-10-CM | POA: Diagnosis not present

## 2020-01-05 DIAGNOSIS — F4323 Adjustment disorder with mixed anxiety and depressed mood: Secondary | ICD-10-CM | POA: Diagnosis not present

## 2020-01-05 DIAGNOSIS — R188 Other ascites: Secondary | ICD-10-CM | POA: Diagnosis not present

## 2020-01-05 DIAGNOSIS — Z934 Other artificial openings of gastrointestinal tract status: Secondary | ICD-10-CM | POA: Diagnosis not present

## 2020-01-05 DIAGNOSIS — I776 Arteritis, unspecified: Secondary | ICD-10-CM | POA: Diagnosis present

## 2020-01-05 DIAGNOSIS — E440 Moderate protein-calorie malnutrition: Secondary | ICD-10-CM | POA: Diagnosis not present

## 2020-01-05 DIAGNOSIS — R9431 Abnormal electrocardiogram [ECG] [EKG]: Secondary | ICD-10-CM | POA: Diagnosis not present

## 2020-01-05 DIAGNOSIS — J9383 Other pneumothorax: Secondary | ICD-10-CM | POA: Diagnosis not present

## 2020-01-05 DIAGNOSIS — E875 Hyperkalemia: Secondary | ICD-10-CM | POA: Diagnosis not present

## 2020-01-05 DIAGNOSIS — N170 Acute kidney failure with tubular necrosis: Secondary | ICD-10-CM | POA: Diagnosis not present

## 2020-01-05 DIAGNOSIS — J80 Acute respiratory distress syndrome: Secondary | ICD-10-CM | POA: Diagnosis not present

## 2020-01-05 DIAGNOSIS — Z79899 Other long term (current) drug therapy: Secondary | ICD-10-CM | POA: Diagnosis not present

## 2020-04-17 IMAGING — DX DG CHEST 2V
2 series · 2 of 2 positions shown · non-contrast
Comparison: 06/05/2017

CLINICAL DATA: Dry cough, congestion for 2-3 weeks

EXAM:
CHEST - 2 VIEW

[dg chest 2 view (1 of 2)]
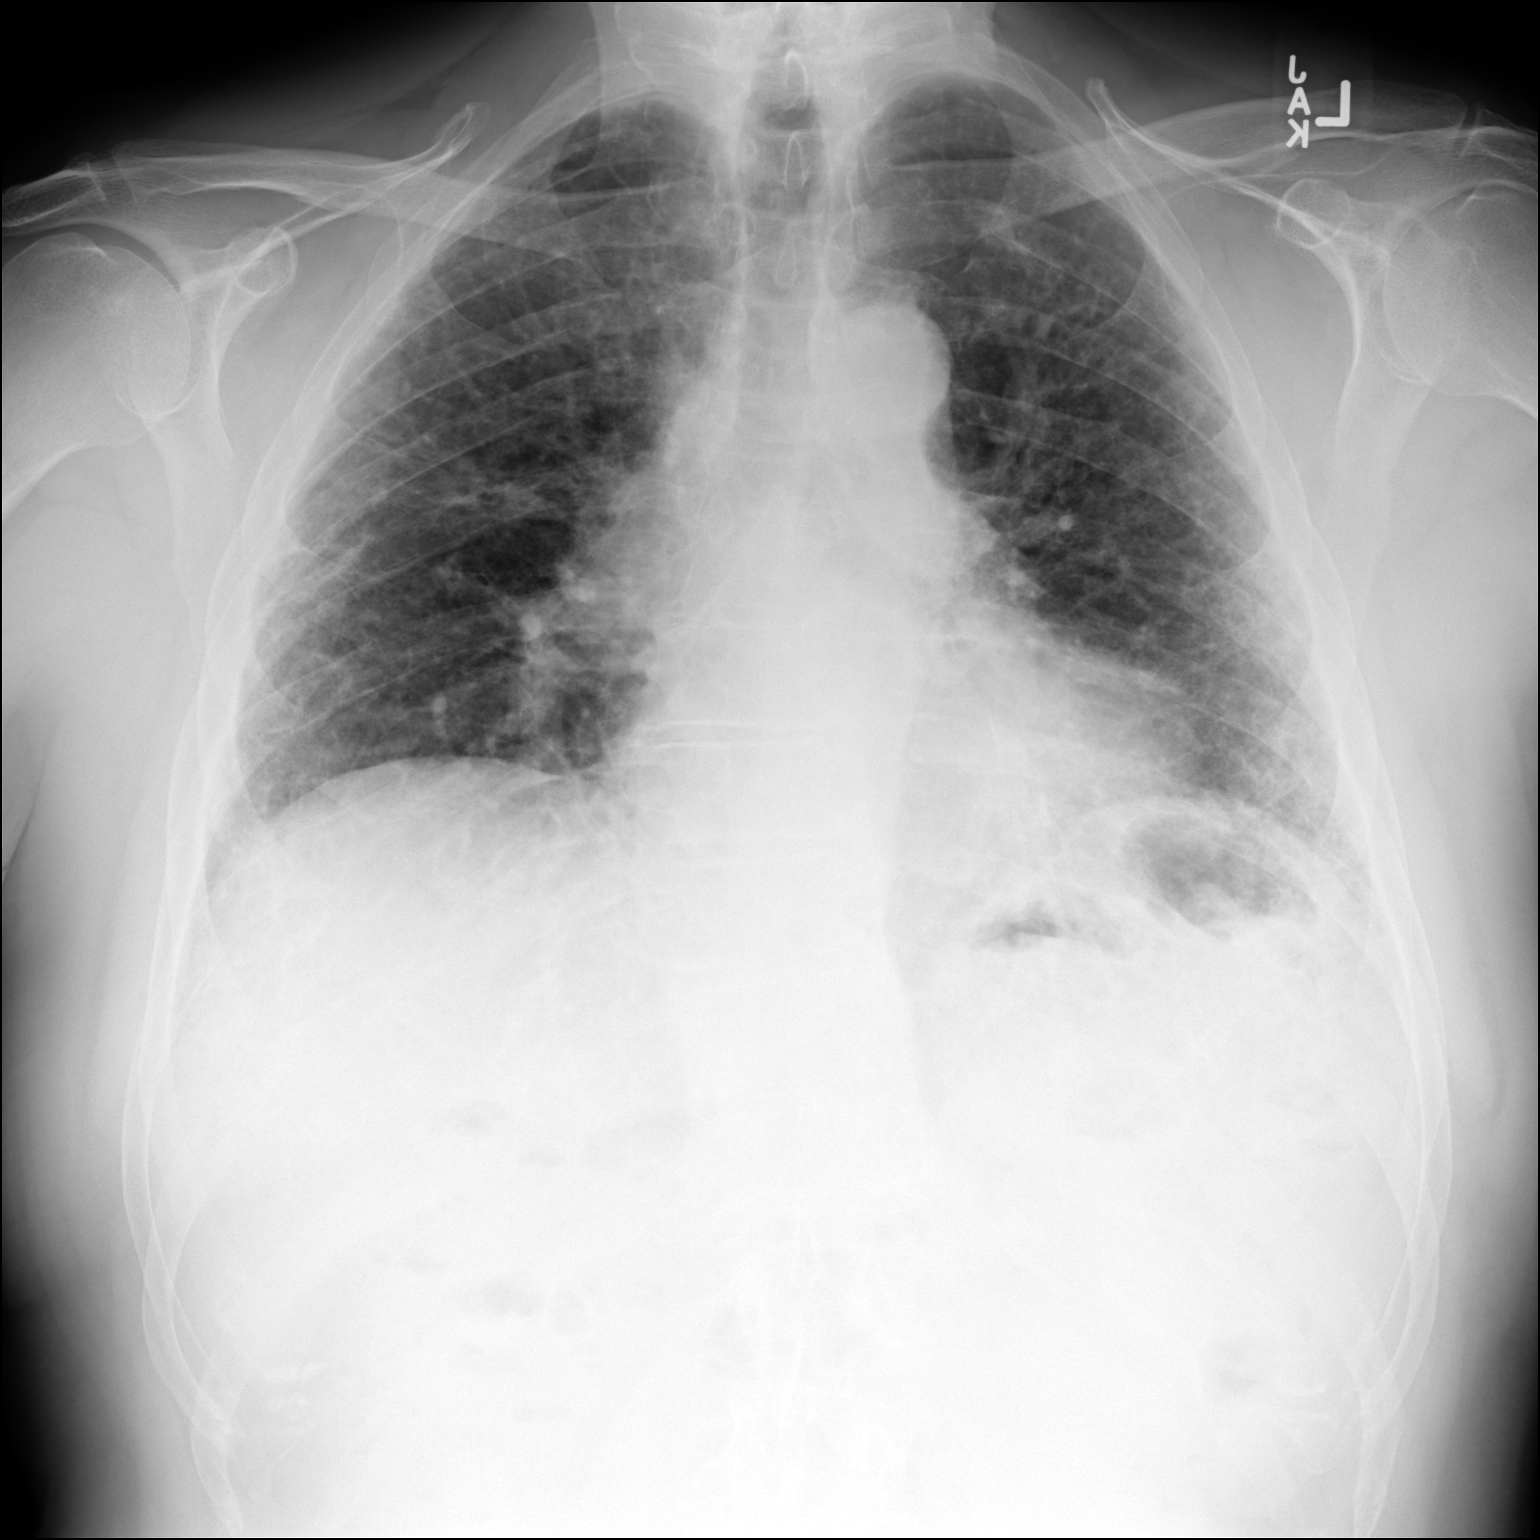

[dg chest 2 view (2 of 2)]
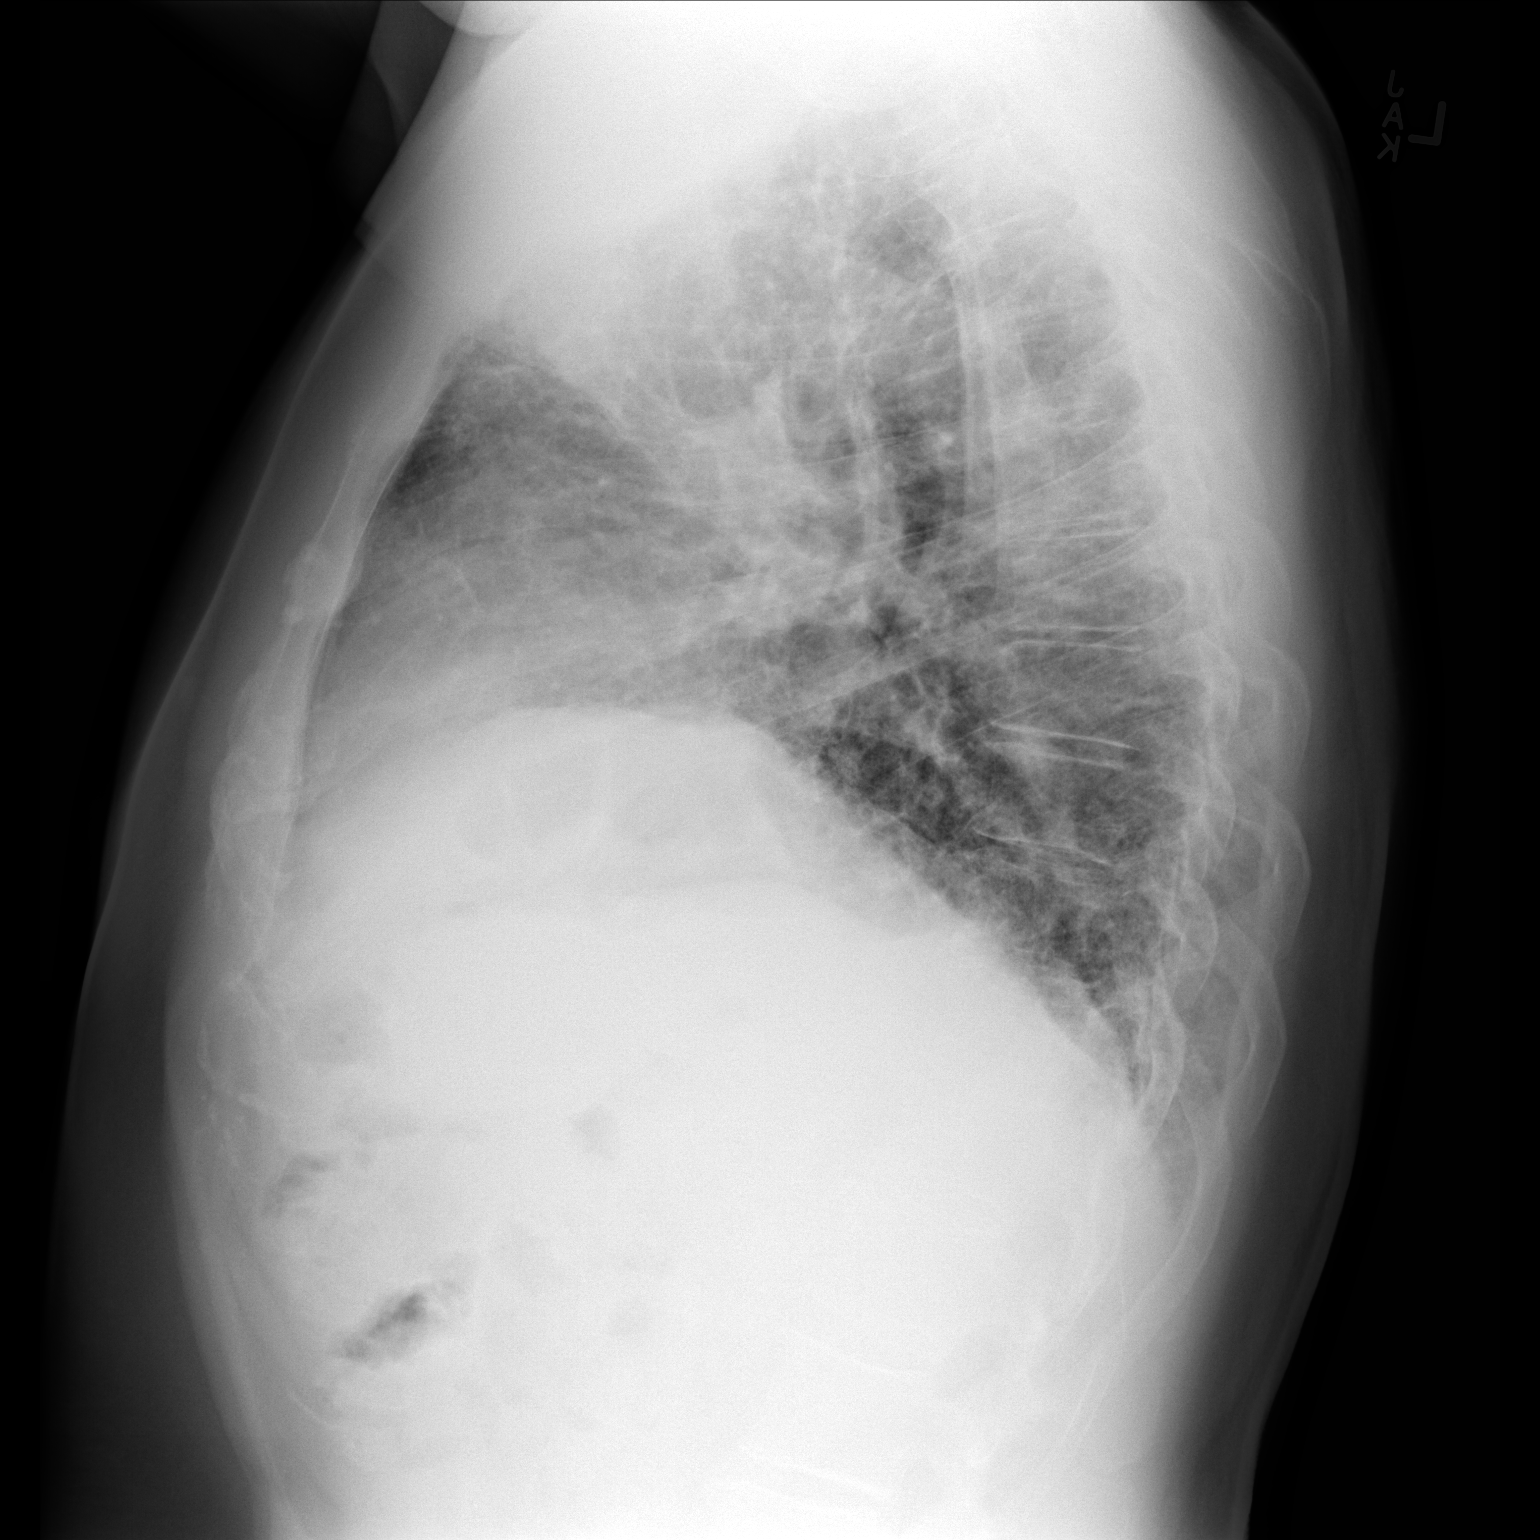

[2 of 2 positions shown; findings below may reference images not displayed]

FINDINGS: Diffuse bilateral mild interstitial thickening. No pleural effusion
or pneumothorax. Stable cardiomediastinal silhouette. No acute
osseous abnormality.
IMPRESSION: Chronic interstitial lung disease.

## 2020-04-18 DEATH — deceased
